# Patient Record
Sex: Female | Born: 1950 | Race: Black or African American | Hispanic: No | Marital: Married | Smoking: Never smoker
Health system: Southern US, Community
[De-identification: ages and names within clinical notes are randomized; demographics above are authoritative.]

## PROBLEM LIST (undated history)

## (undated) DIAGNOSIS — M069 Rheumatoid arthritis, unspecified: Secondary | ICD-10-CM

## (undated) DIAGNOSIS — E079 Disorder of thyroid, unspecified: Secondary | ICD-10-CM

## (undated) DIAGNOSIS — M109 Gout, unspecified: Secondary | ICD-10-CM

## (undated) DIAGNOSIS — I10 Essential (primary) hypertension: Secondary | ICD-10-CM

## (undated) DIAGNOSIS — J302 Other seasonal allergic rhinitis: Secondary | ICD-10-CM

## (undated) DIAGNOSIS — N200 Calculus of kidney: Secondary | ICD-10-CM

## (undated) DIAGNOSIS — D72819 Decreased white blood cell count, unspecified: Secondary | ICD-10-CM

## (undated) DIAGNOSIS — E7800 Pure hypercholesterolemia, unspecified: Secondary | ICD-10-CM

## (undated) HISTORY — DX: Calculus of kidney: N20.0

## (undated) HISTORY — PX: OTHER SURGICAL HISTORY: SHX169

## (undated) HISTORY — PX: BILATERAL KNEE ARTHROSCOPY: SUR91

## (undated) HISTORY — DX: Decreased white blood cell count, unspecified: D72.819

## (undated) HISTORY — DX: Disorder of thyroid, unspecified: E07.9

## (undated) HISTORY — DX: Pure hypercholesterolemia, unspecified: E78.00

## (undated) HISTORY — DX: Rheumatoid arthritis, unspecified: M06.9

---

## 2003-12-26 ENCOUNTER — Ambulatory Visit (HOSPITAL_COMMUNITY): Admission: RE | Admit: 2003-12-26 | Discharge: 2003-12-26 | Payer: Self-pay | Admitting: Family Medicine

## 2005-02-15 ENCOUNTER — Ambulatory Visit (HOSPITAL_COMMUNITY): Admission: RE | Admit: 2005-02-15 | Discharge: 2005-02-15 | Payer: Self-pay | Admitting: Family Medicine

## 2005-02-23 ENCOUNTER — Ambulatory Visit (HOSPITAL_COMMUNITY): Admission: RE | Admit: 2005-02-23 | Discharge: 2005-02-23 | Payer: Self-pay | Admitting: Family Medicine

## 2005-11-19 ENCOUNTER — Ambulatory Visit: Payer: Self-pay | Admitting: Orthopedic Surgery

## 2005-11-27 ENCOUNTER — Ambulatory Visit (HOSPITAL_COMMUNITY): Admission: RE | Admit: 2005-11-27 | Discharge: 2005-11-27 | Payer: Self-pay | Admitting: Orthopedic Surgery

## 2005-12-09 ENCOUNTER — Ambulatory Visit: Payer: Self-pay | Admitting: Orthopedic Surgery

## 2005-12-18 ENCOUNTER — Ambulatory Visit: Payer: Self-pay | Admitting: Orthopedic Surgery

## 2005-12-18 ENCOUNTER — Ambulatory Visit (HOSPITAL_COMMUNITY): Admission: RE | Admit: 2005-12-18 | Discharge: 2005-12-18 | Payer: Self-pay | Admitting: Orthopedic Surgery

## 2005-12-21 ENCOUNTER — Ambulatory Visit: Payer: Self-pay | Admitting: Orthopedic Surgery

## 2005-12-22 ENCOUNTER — Encounter (HOSPITAL_COMMUNITY): Admission: RE | Admit: 2005-12-22 | Discharge: 2006-01-21 | Payer: Self-pay | Admitting: Orthopedic Surgery

## 2006-01-11 ENCOUNTER — Ambulatory Visit: Payer: Self-pay | Admitting: Orthopedic Surgery

## 2006-03-17 ENCOUNTER — Ambulatory Visit: Payer: Self-pay | Admitting: Orthopedic Surgery

## 2006-06-03 ENCOUNTER — Ambulatory Visit (HOSPITAL_COMMUNITY): Admission: RE | Admit: 2006-06-03 | Discharge: 2006-06-03 | Payer: Self-pay | Admitting: Obstetrics & Gynecology

## 2006-09-29 ENCOUNTER — Ambulatory Visit: Payer: Self-pay | Admitting: Orthopedic Surgery

## 2007-01-06 ENCOUNTER — Ambulatory Visit: Payer: Self-pay | Admitting: Orthopedic Surgery

## 2007-01-07 ENCOUNTER — Ambulatory Visit: Payer: Self-pay | Admitting: Cardiovascular Disease

## 2007-01-10 ENCOUNTER — Ambulatory Visit (HOSPITAL_COMMUNITY): Admission: RE | Admit: 2007-01-10 | Discharge: 2007-01-10 | Payer: Self-pay | Admitting: Cardiovascular Disease

## 2007-01-10 ENCOUNTER — Ambulatory Visit: Payer: Self-pay | Admitting: Cardiovascular Disease

## 2008-01-17 ENCOUNTER — Ambulatory Visit (HOSPITAL_COMMUNITY): Admission: RE | Admit: 2008-01-17 | Discharge: 2008-01-17 | Payer: Self-pay | Admitting: Family Medicine

## 2008-09-17 ENCOUNTER — Ambulatory Visit (HOSPITAL_COMMUNITY): Admission: RE | Admit: 2008-09-17 | Discharge: 2008-09-17 | Payer: Self-pay | Admitting: Family Medicine

## 2011-02-20 ENCOUNTER — Other Ambulatory Visit (HOSPITAL_COMMUNITY): Payer: Self-pay | Admitting: Family Medicine

## 2011-02-20 DIAGNOSIS — M549 Dorsalgia, unspecified: Secondary | ICD-10-CM

## 2011-02-20 DIAGNOSIS — G8929 Other chronic pain: Secondary | ICD-10-CM

## 2011-02-24 ENCOUNTER — Ambulatory Visit (HOSPITAL_COMMUNITY)
Admission: RE | Admit: 2011-02-24 | Discharge: 2011-02-24 | Disposition: A | Discharge status: Home / Self Care | Payer: BC Managed Care – PPO | Source: Ambulatory Visit | Attending: Family Medicine | Admitting: Family Medicine

## 2011-02-24 ENCOUNTER — Encounter (HOSPITAL_COMMUNITY): Payer: Self-pay

## 2011-02-24 DIAGNOSIS — M549 Dorsalgia, unspecified: Secondary | ICD-10-CM

## 2011-02-24 DIAGNOSIS — M949 Disorder of cartilage, unspecified: Secondary | ICD-10-CM | POA: Insufficient documentation

## 2011-02-24 DIAGNOSIS — G8929 Other chronic pain: Secondary | ICD-10-CM

## 2011-02-24 DIAGNOSIS — M899 Disorder of bone, unspecified: Secondary | ICD-10-CM | POA: Insufficient documentation

## 2011-02-24 HISTORY — DX: Essential (primary) hypertension: I10

## 2011-04-17 NOTE — Procedures (Signed)
Megan [name], Megan [name] NO.:  [id number]   MEDICAL RECORD NO.:  [id number]          PATIENT TYPE:  OUT   LOCATION:  RAD                           FACILITY:  [facility]   PHYSICIAN:  Peter C. Eden Emms, MD, FACCDATE OF BIRTH:  April 30, 1951   DATE OF PROCEDURE:  01/10/2007  DATE OF DISCHARGE:                                ECHOCARDIOGRAM   INDICATIONS:  Palpitations and preop clearance   Left ventricular cavity size was upper limits of normal.  There was mild  LVH with basal septal hypertrophy.  The EF was 60%.  There was no  outflow tract gradient.   Mitral, aortic, tricuspid and pulmonary valves were structurally normal.  There are no stenoses or significant regurgitation.  There was moderate  aortic root dilatation consistent with hypertensive heart disease.  There is no evidence of dissection.   Subcostal imaging revealed no atrial septal defect, no source of  embolus, no effusion.   M-Mode measurements included an aortic dimension of 3.8 cm. Left atrial  dimension was 3.3 cm. Septal thickness was 1.4 cm. LV diastolic  dimension was 3.6 cm, and systolic dimension was 2.4 cm.   FINAL IMPRESSION:  1. Mild left ventricular hypertrophy with basal septal hypertrophy,      ejection fraction 60%.  2. Normal mitral valve.  3. Normal aortic valve.  4. Moderate aortic root dilatation.  5. Normal right ventricle and right atrium.  6. No pericardial effusion.      [name]. Eden Emms, MD, [facility]  Electronically Signed     PCN/MEDQ  D:  01/10/2007  T:  01/10/2007  Job:  [id number]

## 2011-04-17 NOTE — H&P (Signed)
Megan Megan [name], Megan [name] NO.:  [id number]   MEDICAL RECORD NO.:  [id number]          PATIENT TYPE:  AMB   LOCATION:  DAY                           FACILITY:  [facility]   PHYSICIAN:  Vickki Hearing, M.D.DATE OF BIRTH:  04-Jun-1951   DATE OF ADMISSION:  DATE OF DISCHARGE:  LH                                HISTORY & PHYSICAL   CHIEF COMPLAINT:  Pain in the right knee.   HISTORY OF PRESENT ILLNESS:  The patient is 60 years old.  She has 3  compartment osteoarthritis of the right knee with complex tear of the  posterior horn and mid body of the medial meniscus, and a small joint  effusion and small Baker's cyst.  This is consistent with her complaint of  mechanical symptoms and medial knee pain.   MEDICAL HISTORY:  1.  No allergies.  2.  History of lower back problems.   PAST SURGICAL HISTORY:  No other previous surgery.   MEDICATIONS:  She does not take any medications.   FAMILY HISTORY:  Asthma.   FAMILY PSYCHIATRIC HISTORY:  Dr. Renard Matter.   SOCIAL HISTORY:  She is married.  Currently not working.  Does not smoke or  drink.  Caffeine use on occasion.  Highest grade completed 2 years of  college.   REVIEW OF SYSTEMS:  History of weight gain, shortness of breath, kidney  stones, migraines, joint pain, joint swelling, thyroid disease.  All other  systems are normal.   PHYSICAL EXAMINATION:  VITAL SIGNS:  Weight 190, pulse 74, respiratory rate  18.  GENERAL APPEARANCE:  Normal.  PULSES:  Peripheral pulses were normal.  NEUROLOGIC:  Gait and station were normal.  EXTREMITIES:  Her right knee showed mild crepitance, functional range of  motion, normal strength and stability.  Pain with hyperflexion of the knee.  A positive McMurray's sign.  No instability in the knee.  Upper extremities  were aligned properly.  SKIN:  Intact.  NEUROPSYCHIATRIC EXAM:  Normal.   DIAGNOSIS:  Osteoarthritis, torn medial meniscus, right knee.   PLAN:  Arthroscopy, right  knee.   ADDENDUM:  The patient has given informed consent after explanation of risks  and benefits of the procedure including alternative of not doing any  surgery.      Vickki Hearing, M.D.  Electronically Signed     SEH/MEDQ  D:  12/15/2005  T:  12/15/2005  Job:  [id number]   cc:   [facility]

## 2011-04-17 NOTE — Op Note (Signed)
NAMEBABYGIRL, TRAGER NO.:  [id number]   MEDICAL RECORD NO.:  [id number]          PATIENT TYPE:  AMB   LOCATION:  DAY                           FACILITY:  [facility]   PHYSICIAN:  Vickki Hearing, M.D.DATE OF BIRTH:  1951/11/29   DATE OF PROCEDURE:  12/18/2005  DATE OF DISCHARGE:                                 OPERATIVE REPORT   SURGICAL HISTORY:  Megan [name] is 54 years, complained of right knee pain  with mechanical symptoms. Had MRI which showed a torn medial meniscus. This  was consistent with history and physical findings of medial compartment  tenderness, medial joint line pain and mechanical findings on exam.   PREOPERATIVE DIAGNOSIS:  Osteoarthritis of the right knee with torn medial  meniscus.   POSTOPERATIVE DIAGNOSIS:  Osteoarthritis of the right knee with torn medial  meniscus.   PROCEDURE:  Arthroscopy, partial medial meniscectomy, chondroplasty of the  medial femoral condyle and patella.   SURGEON:  Dr. Romeo Apple, no assistant.   SPECIMENS:  No specimen.   BLOOD LOSS:  Minimal.   COMPLICATIONS:  No complications. Counts reported as correct. The patient  went to PACU in good condition.   OPERATIVE FINDINGS:  There was a torn posterior horn to medial meniscus  degenerative complex tear. There was grade 3 changes of the medial femoral  condyle and grade 2/3 changes of the patella. Lateral compartment normal.  ACL, PCL intact.   PROCEDURE NOTE:  Procedure was done as follows:  The patient was identified  as Megan [name] in the preoperative holding area. Right knee was marked for  surgery, countersigned by the surgeon. History and physical updated and  antibiotics were started. She was taking to the operating room for general  anesthetic. After successful anesthesia, she was placed supine operating  table, and her right leg was prepped and draped using sterile technique. At  this time, a time-out was taken and completed as required.   Lateral  portal was established as a viewing portal, medial portal as a  working portal and diagnostic arthroscopy was done. See above for findings.  Through the medial portal, a medial meniscectomy was performed with a  straight duckbill forceps, and the medial meniscus was balanced with a  combination of a straight motorized shaver and a 90 degree ArthroCare wand.  Chondroplasty was then performed of the patella and medial femoral condyle  with a combination of arthroscopic instruments.   The knee was then irrigated, suctioned dry, closed with half inch Steri-  Strips, injected with half percent Marcaine with epinephrine, 20 cc total.  Sterile dressings and CryoCuff were applied. The patient was extubated and  taken to recovery room in stable condition.   POSTOPERATIVE PLAN:  Follow-up on Monday. Full weightbearing today. Change  dressings tomorrow. Start PT on Tuesday. She will be discharged home on  Vicodin ES one q.4h. p.r.n. for pain #60 with two refills.      Vickki Hearing, M.D.  Electronically Signed     SEH/MEDQ  D:  12/18/2005  T:  12/18/2005  Job:  [id number]

## 2011-04-17 NOTE — Assessment & Plan Note (Signed)
[name]                       [place] [facility] OFFICE NOTE   [name], [name]                        MRN:          [id number]  DATE:01/07/2007                            DOB:          Oct 23, 1951    [name] is referred today by Dr. Romeo Apple for preoperative clearance.  She is a 60 year old. She has been having palpitations. Palpitations  have been longstanding for at least 5 to 10 years. She has never had  syncope, chest pain, PND, orthopnea or diaphoresis.   She was on Lipitor in the past for hyperlipidemia and felt that this  made her heart race worse.   There has been no documented arrhythmias and no evidence of atrial  fibrillation or VT.   The patient has not had a previous cardiac work up.   Coronary risk factors include:  1. Hyperlipidemia.  2. Borderline blood pressure.  3. She is not a smoker, but gets some passive smoke from her husband      who smokes cigars around the house.   The patient needs right knee surgery. She has significant osteoarthritis  there.   She has had some cartilage removed from this knee before.   She does not smoke or drink.   She had a D&C 12 years ago, which is her only other surgery.   She has no documented allergies.   The patient's father died at age 77 of Alzheimer's. Mother died at age  22 of asthma.   She has a brother with high blood pressure.   She is happily married. She is retired. She continues to walk on a  regular basis including using a treadmill. This is all despite her right  knee osteoarthritis.   She takes Diovan 80 mg a day.   Her examination is remarkable for blood pressure of 128/70, pulse is 80  and regular.  HEENT: Is normal. There are no carotid bruits. No lymphadenopathy.  LUNGS:  Are clear.  She has an S1, S2 with a very short benign-sounding systolic ejection  murmur.  ABDOMEN: Is benign.  LOWER EXTREMITIES: Intact pulses. No edema.   EKG shows sinus  rhythm with nonspecific ST-T wave changes in the  inferior lateral leads and poor R-wave progression.   IMPRESSION:  The patient has an abnormal EKG. She is having  palpitations.   In regards to the risk for knee surgery, I think it is reasonable to  start her on low-dose Toprol at 25 mg a day. Beta-blockers have been  shown to be the best medication to decrease peri-operative risk.   We will do a stress Myoview on the patient to make sure that the EKG  changes do not represent occult coronary disease. She tells me she  thinks she can walk on the treadmill. Will also do a 2D echocardiogram  to assess LV function.   As long as her Myoview and echo are low-risk, we will allow her to have  her surgery February 26th, with Dr. Romeo Apple. She will take her Toprol  once a day including the morning of surgery. We would be happy to follow  her in the hospital post-op.     Megan [name]. Eden Emms, MD, [facility]  Electronically Signed    PCN/MedQ  DD: 01/07/2007  DT: 01/07/2007  Job #: [id number]

## 2011-06-08 ENCOUNTER — Other Ambulatory Visit (HOSPITAL_COMMUNITY): Payer: Self-pay | Admitting: Family Medicine

## 2011-06-08 ENCOUNTER — Ambulatory Visit (HOSPITAL_COMMUNITY)
Admission: RE | Admit: 2011-06-08 | Discharge: 2011-06-08 | Disposition: A | Discharge status: Home / Self Care | Payer: BC Managed Care – PPO | Source: Ambulatory Visit | Attending: Family Medicine | Admitting: Family Medicine

## 2011-06-08 DIAGNOSIS — M7730 Calcaneal spur, unspecified foot: Secondary | ICD-10-CM | POA: Insufficient documentation

## 2011-06-08 DIAGNOSIS — M545 Low back pain, unspecified: Secondary | ICD-10-CM

## 2011-06-08 DIAGNOSIS — M21969 Unspecified acquired deformity of unspecified lower leg: Secondary | ICD-10-CM

## 2011-06-08 DIAGNOSIS — M51379 Other intervertebral disc degeneration, lumbosacral region without mention of lumbar back pain or lower extremity pain: Secondary | ICD-10-CM | POA: Insufficient documentation

## 2011-06-08 DIAGNOSIS — M25579 Pain in unspecified ankle and joints of unspecified foot: Secondary | ICD-10-CM | POA: Insufficient documentation

## 2011-06-08 DIAGNOSIS — M2010 Hallux valgus (acquired), unspecified foot: Secondary | ICD-10-CM | POA: Insufficient documentation

## 2011-06-08 DIAGNOSIS — M5137 Other intervertebral disc degeneration, lumbosacral region: Secondary | ICD-10-CM | POA: Insufficient documentation

## 2012-02-25 DIAGNOSIS — M25569 Pain in unspecified knee: Secondary | ICD-10-CM | POA: Insufficient documentation

## 2012-03-01 DIAGNOSIS — M1711 Unilateral primary osteoarthritis, right knee: Secondary | ICD-10-CM | POA: Insufficient documentation

## 2012-03-01 DIAGNOSIS — M2140 Flat foot [pes planus] (acquired), unspecified foot: Secondary | ICD-10-CM | POA: Insufficient documentation

## 2014-07-11 ENCOUNTER — Other Ambulatory Visit (HOSPITAL_COMMUNITY): Payer: Self-pay | Admitting: Internal Medicine

## 2014-07-11 DIAGNOSIS — Z139 Encounter for screening, unspecified: Secondary | ICD-10-CM

## 2014-07-13 ENCOUNTER — Ambulatory Visit (HOSPITAL_COMMUNITY): Payer: BC Managed Care – PPO

## 2014-07-16 ENCOUNTER — Other Ambulatory Visit: Payer: Self-pay

## 2014-07-16 ENCOUNTER — Telehealth: Payer: Self-pay | Admitting: *Deleted

## 2014-07-16 DIAGNOSIS — Z1211 Encounter for screening for malignant neoplasm of colon: Secondary | ICD-10-CM

## 2014-07-16 NOTE — Telephone Encounter (Signed)
Pt called wanting to schedule her colonoscopy. Please advise [phone] or her cell [phone]

## 2014-07-18 NOTE — Telephone Encounter (Signed)
PREPOPIK-DRINK WATER TO KEEP URINE LIGHT YELLOW.  PT SHOULD DROP OFF RX 3 DAYS PRIOR TO PROCEDURE.  

## 2014-07-18 NOTE — Telephone Encounter (Signed)
Gastroenterology Pre-Procedure Review  Request Date: 07/17/2014 Requesting Physician: Dr. Legrand Rams  PATIENT REVIEW QUESTIONS: The patient responded to the following health history questions as indicated:    1. Diabetes Melitis: no 2. Joint replacements in the past 12 months: no 3. Major health problems in the past 3 months: no 4. Has an artificial valve or MVP: no 5. Has a defibrillator: no 6. Has been advised in past to take antibiotics in advance of a procedure like teeth cleaning: no    MEDICATIONS & ALLERGIES:    Patient reports the following regarding taking any blood thinners:   Plavix? no Aspirin? no Coumadin? no  Patient confirms/reports the following medications:  Current Outpatient Prescriptions  Medication Sig Dispense Refill  . acetaminophen (TYLENOL) 325 MG tablet Take 650 mg by mouth every 6 (six) hours as needed. Only as needed      . ibuprofen (ADVIL,MOTRIN) 200 MG tablet Take 200 mg by mouth every 6 (six) hours as needed. Only as needed       No current facility-administered medications for this visit.    Patient confirms/reports the following allergies:  No Known Allergies  No orders of the defined types were placed in this encounter.    AUTHORIZATION INFORMATION Primary Insurance:   ID #:   Group #:  Pre-Cert / Auth required:  Pre-Cert / Auth #:   Secondary Insurance:   ID #:   Group #:  Pre-Cert / Auth required:  Pre-Cert / Auth #:   SCHEDULE INFORMATION: Procedure has been scheduled as follows:  Date: 07/30/2014                   Time:  9:30 AM Location: [facility] Short Stay  This Gastroenterology Pre-Precedure Review Form is being routed to the following provider(s): Barney Drain, MD

## 2014-07-19 ENCOUNTER — Encounter (HOSPITAL_COMMUNITY): Payer: Self-pay | Admitting: Pharmacy Technician

## 2014-07-19 MED ORDER — SOD PICOSULFATE-MAG OX-CIT ACD 10-3.5-12 MG-GM-GM PO PACK: 1.0000 | PACK | ORAL | Status: DC

## 2014-07-19 NOTE — Telephone Encounter (Signed)
Rx sent to the pharmacy and instructions mailed to pt.  

## 2014-07-26 ENCOUNTER — Telehealth: Payer: Self-pay

## 2014-07-26 NOTE — Telephone Encounter (Signed)
Pt had questions about her prep because it was different from her husband's. He just had his colonoscopy a few weeks a go by Dr. Gala Romney. I explained to her that the doctors use different preps. Questions answered.

## 2014-07-30 ENCOUNTER — Encounter (HOSPITAL_COMMUNITY)
Admission: RE | Disposition: A | Discharge status: Home / Self Care | Payer: Self-pay | Source: Ambulatory Visit | Attending: Gastroenterology

## 2014-07-30 ENCOUNTER — Ambulatory Visit (HOSPITAL_COMMUNITY)
Admission: RE | Admit: 2014-07-30 | Discharge: 2014-07-30 | Disposition: A | Discharge status: Home / Self Care | Payer: No Typology Code available for payment source | Source: Ambulatory Visit | Attending: Gastroenterology | Admitting: Gastroenterology

## 2014-07-30 ENCOUNTER — Encounter (HOSPITAL_COMMUNITY): Payer: Self-pay | Admitting: *Deleted

## 2014-07-30 DIAGNOSIS — D126 Benign neoplasm of colon, unspecified: Secondary | ICD-10-CM | POA: Diagnosis not present

## 2014-07-30 DIAGNOSIS — K5730 Diverticulosis of large intestine without perforation or abscess without bleeding: Secondary | ICD-10-CM | POA: Diagnosis not present

## 2014-07-30 DIAGNOSIS — I10 Essential (primary) hypertension: Secondary | ICD-10-CM | POA: Insufficient documentation

## 2014-07-30 DIAGNOSIS — Z79899 Other long term (current) drug therapy: Secondary | ICD-10-CM | POA: Insufficient documentation

## 2014-07-30 DIAGNOSIS — K648 Other hemorrhoids: Secondary | ICD-10-CM | POA: Diagnosis not present

## 2014-07-30 DIAGNOSIS — Z1211 Encounter for screening for malignant neoplasm of colon: Secondary | ICD-10-CM | POA: Insufficient documentation

## 2014-07-30 HISTORY — PX: COLONOSCOPY: SHX5424

## 2014-07-30 HISTORY — DX: Other seasonal allergic rhinitis: J30.2

## 2014-07-30 SURGERY — COLONOSCOPY
Anesthesia: Moderate Sedation

## 2014-07-30 MED ORDER — STERILE WATER FOR IRRIGATION IR SOLN
Status: DC | PRN
  Administered 2014-07-30: 10:00:00

## 2014-07-30 MED ORDER — SODIUM CHLORIDE 0.9 % IV SOLN
INTRAVENOUS | Status: DC
  Administered 2014-07-30: 09:00:00 via INTRAVENOUS

## 2014-07-30 MED ORDER — HYDROCORTISONE 2.5 % RE CREA: 1.0000 "application " | TOPICAL_CREAM | Freq: Two times a day (BID) | RECTAL | Status: DC

## 2014-07-30 MED ORDER — SPOT INK MARKER SYRINGE KIT: PACK | SUBMUCOSAL | Status: DC | PRN

## 2014-07-30 MED ORDER — MEPERIDINE HCL 100 MG/ML IJ SOLN
INTRAMUSCULAR | Status: AC
  Filled 2014-07-30: qty 2

## 2014-07-30 MED ORDER — MEPERIDINE HCL 100 MG/ML IJ SOLN
INTRAMUSCULAR | Status: DC | PRN
  Administered 2014-07-30 (×2): 25 mg via INTRAVENOUS

## 2014-07-30 MED ORDER — MIDAZOLAM HCL 5 MG/5ML IJ SOLN
INTRAMUSCULAR | Status: DC | PRN
  Administered 2014-07-30: 1 mg via INTRAVENOUS
  Administered 2014-07-30 (×2): 2 mg via INTRAVENOUS

## 2014-07-30 MED ORDER — MIDAZOLAM HCL 5 MG/5ML IJ SOLN
INTRAMUSCULAR | Status: AC
  Filled 2014-07-30: qty 10

## 2014-07-30 NOTE — H&P (Signed)
  Primary Care Physician:  Rosita Fire, MD Primary Gastroenterologist:  Dr. Oneida Alar  Pre-Procedure History & Physical: HPI:  Megan [name] is a 63 y.o. female here for [place].  Past Medical History  Diagnosis Date  . Hypertension   . Seasonal allergies     Past Surgical History  Procedure Laterality Date  . Bilateral knee arthroscopy    . Right knee osteotomy      Prior to Admission medications   Medication Sig Start Date End Date Taking? Authorizing Provider  acetaminophen (TYLENOL) 325 MG tablet Take 650 mg by mouth every 6 (six) hours as needed. Only as needed   Yes Historical Provider, MD  hydrochlorothiazide (MICROZIDE) 12.5 MG capsule Take 12.5 mg by mouth daily.   Yes Historical Provider, MD  ibuprofen (ADVIL,MOTRIN) 200 MG tablet Take 200 mg by mouth every 6 (six) hours as needed. Only as needed   Yes Historical Provider, MD    Allergies as of 07/16/2014  . (No Known Allergies)    Family History  Problem Relation Age of Onset  . Colon cancer Paternal Uncle     History   Social History  . Marital Status: Married    Spouse Name: N/A    Number of Children: N/A  . Years of Education: N/A   Occupational History  . Not on file.   Social History Main Topics  . Smoking status: Never Smoker   . Smokeless tobacco: Not on file  . Alcohol Use: No  . Drug Use: No  . Sexual Activity: Not on file   Other Topics Concern  . Not on file   Social History Narrative  . No narrative on file    Review of Systems: See HPI, otherwise negative ROS   Physical Exam: BP 134/84  Pulse 70  Temp(Src) 98.1 F (36.7 C) (Oral)  Resp 18  Ht 5' 5.5" (1.664 m)  Wt 155 lb (70.308 kg)  BMI 25.39 kg/m2  SpO2 98% General:   Alert,  pleasant and cooperative in NAD Head:  Normocephalic and atraumatic. Neck:  Supple; Lungs:  Clear throughout to auscultation.    Heart:  Regular rate and rhythm. Abdomen:  Soft, nontender and nondistended. Normal bowel sounds,  without guarding, and without rebound.   Neurologic:  Alert and  oriented x4;  grossly normal neurologically.  Impression/Plan:     SCREENING  Plan:  1. TCS TODAY

## 2014-07-30 NOTE — Discharge Instructions (Signed)
You had 1 polyp removed. You have  internal hemorrhoids and diverticulosis IN YOUR LEFT COLON.   USE PROCTOZONE TWICE DAILY FOR 7-10 DAYS TO RELIVE RECTAL PAIN/PRESSURE/BLEEDING.  FOLLOW A HIGH FIBER DIET. AVOID ITEMS THAT CAUSE BLOATING. SEE INFO BELOW.  YOUR BIOPSY RESULTS SHOULD BE BACK IN 7 DAYS.  Next colonoscopy in 5-10 years.   Colonoscopy Care After Read the instructions outlined below and refer to this sheet in the next week. These discharge instructions provide you with general information on caring for yourself after you leave the hospital. While your treatment has been planned according to the most current medical practices available, unavoidable complications occasionally occur. If you have any problems or questions after discharge, call DR. Caylynn Minchew, [phone].  ACTIVITY  You may resume your regular activity, but move at a slower pace for the next 24 hours.   Take frequent rest periods for the next 24 hours.   Walking will help get rid of the air and reduce the bloated feeling in your belly (abdomen).   No driving for 24 hours (because of the medicine (anesthesia) used during the test).   You may shower.   Do not sign any important legal documents or operate any machinery for 24 hours (because of the anesthesia used during the test).    NUTRITION  Drink plenty of fluids.   You may resume your normal diet as instructed by your doctor.   Begin with a light meal and progress to your normal diet. Heavy or fried foods are harder to digest and may make you feel sick to your stomach (nauseated).   Avoid alcoholic beverages for 24 hours or as instructed.    MEDICATIONS  You may resume your normal medications.   WHAT YOU CAN EXPECT TODAY  Some feelings of bloating in the abdomen.   Passage of more gas than usual.   Spotting of blood in your stool or on the toilet paper  .  IF YOU HAD POLYPS REMOVED DURING THE COLONOSCOPY:  Eat a soft diet IF YOU HAVE  NAUSEA, BLOATING, ABDOMINAL PAIN, OR VOMITING.    FINDING OUT THE RESULTS OF YOUR TEST Not all test results are available during your visit. DR. Oneida Alar WILL CALL YOU WITHIN 7 DAYS OF YOUR PROCEDUE WITH YOUR RESULTS. Do not assume everything is normal if you have not heard from DR. Stellarose Cerny IN ONE WEEK, CALL HER OFFICE AT [phone].  SEEK IMMEDIATE MEDICAL ATTENTION AND CALL THE OFFICE: [phone] IF:  You have more than a spotting of blood in your stool.   Your belly is swollen (abdominal distention).   You are nauseated or vomiting.   You have a temperature over 101F.   You have abdominal pain or discomfort that is severe or gets worse throughout the day.  Polyps, Colon  A polyp is extra tissue that grows inside your body. Colon polyps grow in the large intestine. The large intestine, also called the colon, is part of your digestive system. It is a long, hollow tube at the end of your digestive tract where your body makes and stores stool. Most polyps are not dangerous. They are benign. This means they are not cancerous. But over time, some types of polyps can turn into cancer. Polyps that are smaller than a pea are usually not harmful. But larger polyps could someday become or may already be cancerous. To be safe, doctors remove all polyps and test them.   WHO GETS POLYPS? Anyone can get polyps, but certain people are  more likely than others. You may have a greater chance of getting polyps if:  You are over 50.   You have had polyps before.   Someone in your family has had polyps.   Someone in your family has had cancer of the large intestine.   Find out if someone in your family has had polyps. You may also be more likely to get polyps if you:   Eat a lot of fatty foods   Smoke   Drink alcohol   Do not exercise  Eat too much   TREATMENT  The caregiver will remove the polyp during sigmoidoscopy or colonoscopy.  PREVENTION There is not one sure way to prevent polyps.  You might be able to lower your risk of getting them if you:  Eat more fruits and vegetables and less fatty food.   Do not smoke.   Avoid alcohol.   Exercise every day.   Lose weight if you are overweight.   Eating more calcium and folate can also lower your risk of getting polyps. Some foods that are rich in calcium are milk, cheese, and broccoli. Some foods that are rich in folate are chickpeas, kidney beans, and spinach.   High-Fiber Diet A high-fiber diet changes your normal diet to include more whole grains, legumes, fruits, and vegetables. Changes in the diet involve replacing refined carbohydrates with unrefined foods. The calorie level of the diet is essentially unchanged. The Dietary Reference Intake (recommended amount) for adult males is 38 grams per day. For adult females, it is 25 grams per day. Pregnant and lactating women should consume 28 grams of fiber per day. Fiber is the intact part of a plant that is not broken down during digestion. Functional fiber is fiber that has been isolated from the plant to provide a beneficial effect in the body. PURPOSE  Increase stool bulk.   Ease and regulate bowel movements.   Lower cholesterol.  INDICATIONS THAT YOU NEED MORE FIBER  Constipation and hemorrhoids.   Uncomplicated diverticulosis (intestine condition) and irritable bowel syndrome.   Weight management.   As a protective measure against hardening of the arteries (atherosclerosis), diabetes, and cancer.   GUIDELINES FOR INCREASING FIBER IN THE DIET  Start adding fiber to the diet slowly. A gradual increase of about 5 more grams (2 slices of whole-wheat bread, 2 servings of most fruits or vegetables, or 1 bowl of high-fiber cereal) per day is best. Too rapid an increase in fiber may result in constipation, flatulence, and bloating.   Drink enough water and fluids to keep your urine clear or pale yellow. Water, juice, or caffeine-free drinks are recommended. Not  drinking enough fluid may cause constipation.   Eat a variety of high-fiber foods rather than one type of fiber.   Try to increase your intake of fiber through using high-fiber foods rather than fiber pills or supplements that contain small amounts of fiber.   The goal is to change the types of food eaten. Do not supplement your present diet with high-fiber foods, but replace foods in your present diet.  INCLUDE A VARIETY OF FIBER SOURCES  Replace refined and processed grains with whole grains, canned fruits with fresh fruits, and incorporate other fiber sources. White rice, white breads, and most bakery goods contain little or no fiber.   Brown whole-grain rice, buckwheat oats, and many fruits and vegetables are all good sources of fiber. These include: broccoli, Brussels sprouts, cabbage, cauliflower, beets, sweet potatoes, white potatoes (skin on), carrots,  tomatoes, eggplant, squash, berries, fresh fruits, and dried fruits.   Cereals appear to be the richest source of fiber. Cereal fiber is found in whole grains and bran. Bran is the fiber-rich outer coat of cereal grain, which is largely removed in refining. In whole-grain cereals, the bran remains. In breakfast cereals, the largest amount of fiber is found in those with "bran" in their names. The fiber content is sometimes indicated on the label.   You may need to include additional fruits and vegetables each day.   In baking, for 1 cup white flour, you may use the following substitutions:   1 cup whole-wheat flour minus 2 tablespoons.   1/2 cup white flour plus 1/2 cup whole-wheat flour.   Diverticulosis Diverticulosis is a common condition that develops when small pouches (diverticula) form in the wall of the colon. The risk of diverticulosis increases with age. It happens more often in people who eat a low-fiber diet. Most individuals with diverticulosis have no symptoms. Those individuals with symptoms usually experience belly  (abdominal) pain, constipation, or loose stools (diarrhea).  HOME CARE INSTRUCTIONS  Increase the amount of fiber in your diet as directed by your caregiver or dietician. This may reduce symptoms of diverticulosis.   Drink at least 6 to 8 glasses of water each day to prevent constipation.   Try not to strain when you have a bowel movement.   Avoiding nuts and seeds to prevent complications is still an uncertain benefit.       FOODS HAVING HIGH FIBER CONTENT INCLUDE:  Fruits. Apple, peach, pear, tangerine, raisins, prunes.   Vegetables. Brussels sprouts, asparagus, broccoli, cabbage, carrot, cauliflower, romaine lettuce, spinach, summer squash, tomato, winter squash, zucchini.   Starchy Vegetables. Baked beans, kidney beans, lima beans, split peas, lentils, potatoes (with skin).   Grains. Whole wheat bread, brown rice, bran flake cereal, plain oatmeal, white rice, shredded wheat, bran muffins.    SEEK IMMEDIATE MEDICAL CARE IF:  You develop increasing pain or severe bloating.   You have an oral temperature above 101F.   You develop vomiting or bowel movements that are bloody or black.   Hemorrhoids Hemorrhoids are dilated (enlarged) veins around the rectum. Sometimes clots will form in the veins. This makes them swollen and painful. These are called thrombosed hemorrhoids. Causes of hemorrhoids include:  Constipation.   Straining to have a bowel movement.   HEAVY LIFTING HOME CARE INSTRUCTIONS  Eat a well balanced diet and drink 6 to 8 glasses of water every day to avoid constipation. You may also use a bulk laxative.   Avoid straining to have bowel movements.   Keep anal area dry and clean.   Do not use a donut shaped pillow or sit on the toilet for long periods. This increases blood pooling and pain.   Move your bowels when your body has the urge; this will require less straining and will decrease pain and pressure.

## 2014-07-30 NOTE — Op Note (Signed)
[facility] [address] [place], [address]   COLONOSCOPY PROCEDURE REPORT  PATIENT: Megan [name], Megan [name]  MR#: [id number] BIRTHDATE: [birth year] , 63  yrs. old GENDER: Female ENDOSCOPIST: Barney Drain, MD REFERRED AX:ENMMHWKG Fanta, M.D. PROCEDURE DATE:  07/30/2014 PROCEDURE:   Colonoscopy with snare polypectomy INDICATIONS:Average risk patient for colon cancer. MEDICATIONS: Demerol 50 mg IV and Versed 5 mg IV  DESCRIPTION OF PROCEDURE:    Physical exam was performed.  Informed consent was obtained from the patient after explaining the benefits, risks, and alternatives to procedure.  The patient was connected to monitor and placed in left lateral position. Continuous oxygen was provided by nasal cannula and IV medicine administered through an indwelling cannula.  After administration of sedation and rectal exam, the patients rectum was intubated and the EC-[id number] ([id number])  colonoscope was advanced under direct visualization to the cecum.  The scope was removed slowly by carefully examining the color, texture, anatomy, and integrity mucosa on the way out.  The patient was recovered in endoscopy and discharged home in satisfactory condition.       COLON FINDINGS: A sessile polyp measuring 6 mm in size was found at the hepatic flexure.  A polypectomy was performed using snare cautery.  , There was mild diverticulosis noted in the sigmoid colon with associated tortuosity and muscular hypertrophy.  , and Moderate sized internal hemorrhoids were found.  PREP QUALITY: good. CECAL W/D TIME: 14 minutes     COMPLICATIONS: None  ENDOSCOPIC IMPRESSION: 1.   ONE COLON polyp REMOVED 2.   Mild diverticulosis in the sigmoid colon 3.   Moderate sized internal hemorrhoids   RECOMMENDATIONS: USE PROCTOZONE TWICE DAILY FOR 7-10 DAYS TO RELIEVE RECTAL PAIN/PRESSURE/BLEEDING. FOLLOW A HIGH FIBER DIET.  AVOID ITEMS THAT CAUSE BLOATING. BIOPSY RESULTS SHOULD BE BACK IN 7  DAYS.  Next colonoscopy in 5-10 years.       _______________________________ Lorrin MaisBarney Drain, MD 07/30/2014 4:08 PM

## 2014-08-09 ENCOUNTER — Telehealth: Payer: Self-pay | Admitting: Gastroenterology

## 2014-08-09 NOTE — Telephone Encounter (Signed)
Reminder in epic °

## 2014-08-09 NOTE — Telephone Encounter (Signed)
Pt is aware of results. 

## 2014-08-09 NOTE — Telephone Encounter (Signed)
Please call pt. She had ONE simple adenoma removed from her colon.   FOLLOW A HIGH FIBER DIET. AVOID ITEMS THAT CAUSE BLOATING.  Next colonoscopy in 5-10 years.

## 2015-07-02 DIAGNOSIS — R7309 Other abnormal glucose: Secondary | ICD-10-CM | POA: Diagnosis not present

## 2015-07-02 DIAGNOSIS — E785 Hyperlipidemia, unspecified: Secondary | ICD-10-CM | POA: Diagnosis not present

## 2015-07-02 DIAGNOSIS — I10 Essential (primary) hypertension: Secondary | ICD-10-CM | POA: Diagnosis not present

## 2015-07-04 DIAGNOSIS — Z23 Encounter for immunization: Secondary | ICD-10-CM | POA: Diagnosis not present

## 2015-07-15 DIAGNOSIS — M25562 Pain in left knee: Secondary | ICD-10-CM | POA: Diagnosis not present

## 2015-07-15 DIAGNOSIS — M542 Cervicalgia: Secondary | ICD-10-CM | POA: Diagnosis not present

## 2015-07-15 DIAGNOSIS — I10 Essential (primary) hypertension: Secondary | ICD-10-CM | POA: Diagnosis not present

## 2015-07-25 ENCOUNTER — Ambulatory Visit: Payer: No Typology Code available for payment source | Admitting: Orthopedic Surgery

## 2015-07-30 ENCOUNTER — Ambulatory Visit (INDEPENDENT_AMBULATORY_CARE_PROVIDER_SITE_OTHER): Payer: Medicare Other

## 2015-07-30 ENCOUNTER — Ambulatory Visit (INDEPENDENT_AMBULATORY_CARE_PROVIDER_SITE_OTHER): Payer: Medicare Other | Admitting: Orthopedic Surgery

## 2015-07-30 VITALS — BP 135/88 | Ht 65.5 in | Wt 161.0 lb

## 2015-07-30 DIAGNOSIS — M25562 Pain in left knee: Secondary | ICD-10-CM | POA: Diagnosis not present

## 2015-07-30 NOTE — Patient Instructions (Signed)
Joint Injection  Care After  Refer to this sheet in the next few days. These instructions provide you with information on caring for yourself after you have had a joint injection. Your caregiver also may give you more specific instructions. Your treatment has been planned according to current medical practices, but problems sometimes occur. Call your caregiver if you have any problems or questions after your procedure.  After any type of joint injection, it is not uncommon to experience:  · Soreness, swelling, or bruising around the injection site.  · Mild numbness, tingling, or weakness around the injection site caused by the numbing medicine used before or with the injection.  It also is possible to experience the following effects associated with the specific agent after injection:  · Iodine-based contrast agents:  ¨ Allergic reaction (itching, hives, widespread redness, and swelling beyond the injection site).  · Corticosteroids (These effects are rare.):  ¨ Allergic reaction.  ¨ Increased blood sugar levels (If you have diabetes and you notice that your blood sugar levels have increased, notify your caregiver).  ¨ Increased blood pressure levels.  ¨ Mood swings.  · Hyaluronic acid in the use of viscosupplementation.  ¨ Temporary heat or redness.  ¨ Temporary rash and itching.  ¨ Increased fluid accumulation in the injected joint.  These effects all should resolve within a day after your procedure.   HOME CARE INSTRUCTIONS  · Limit yourself to light activity the day of your procedure. Avoid lifting heavy objects, bending, stooping, or twisting.  · Take prescription or over-the-counter pain medication as directed by your caregiver.  · You may apply ice to your injection site to reduce pain and swelling the day of your procedure. Ice may be applied 03-04 times:  ¨ Put ice in a plastic bag.  ¨ Place a towel between your skin and the bag.  ¨ Leave the ice on for no longer than 15-20 minutes each time.  SEEK  IMMEDIATE MEDICAL CARE IF:   · Pain and swelling get worse rather than better or extend beyond the injection site.  · Numbness does not go away.  · Blood or fluid continues to leak from the injection site.  · You have chest pain.  · You have swelling of your face or tongue.  · You have trouble breathing or you become dizzy.  · You develop a fever, chills, or severe tenderness at the injection site that last longer than 1 day.  MAKE SURE YOU:  · Understand these instructions.  · Watch your condition.  · Get help right away if you are not doing well or if you get worse.  Document Released: 07/30/2011 Document Revised: 02/08/2012 Document Reviewed: 07/30/2011  ExitCare® Patient Information ©2015 [employer]. This information is not intended to replace advice given to you by your health care provider. Make sure you discuss any questions you have with your health care provider.

## 2015-08-01 ENCOUNTER — Encounter: Payer: Self-pay | Admitting: Orthopedic Surgery

## 2015-08-01 NOTE — Progress Notes (Signed)
Patient ID: Megan [name], female   DOB: February 22, 1951, 64 y.o.   MRN: [id number]   Chief Complaint  Patient presents with  . Knee Pain    left knee pain, no known injury    Megan [name] is a 64 y.o. female.   HPI 64 year old female with left knee pain for 4 months. There was no injury. She reports pain swelling aching from time to time with grade 1-2 out of 10 pain. She does get some relief from taking pain medication. She has the belief of Jehovah's Witness, but no longer is practicing. However does not want blood or any  Review of systems sinusitis some lightheadedness seasonal allergies back pain from time to time.. She had a right knee osteotomy years ago with plate fixation did well Review of Systems See hpi  Past Medical History  Diagnosis Date  . Hypertension   . Seasonal allergies     No Known Allergies  Current Outpatient Prescriptions  Medication Sig Dispense Refill  . acetaminophen (TYLENOL) 325 MG tablet Take 650 mg by mouth every 6 (six) hours as needed. Only as needed    . hydrochlorothiazide (MICROZIDE) 12.5 MG capsule Take 12.5 mg by mouth daily.    . hydrocortisone (PROCTOZONE-HC) 2.5 % rectal cream Place 1 application rectally 2 (two) times daily. For 10 days 30 g 0  . ibuprofen (ADVIL,MOTRIN) 200 MG tablet Take 200 mg by mouth every 6 (six) hours as needed. Only as needed     No current facility-administered medications for this visit.     Physical Exam Blood pressure 135/88, height 5' 5.5" (1.664 m), weight 161 lb (73.029 kg). Physical Exam  The patient is well developed well nourished and well groomed.   Orientation to person place and time is normal   Mood is pleasant.  Ambulatory status she has no difficulties walking    On inspection her right knee shows full range of motion stability and strength with no tenderness or swelling. Neurovascular exam is intact skin is clean dry without laceration or ulceration rash or erythema.    The left knee  is tender over the medial joint line. Range of motion is remained greater than 90 knee is stable in antrum posterior planar no collateral ligament injuries or laxity. Motor exam remains normal skin is normal as well there is no erythema lacerations or rashes. Neurovascular exam is intact.   Data Reviewed X-rays of the knee today in the office were ordered. I have interpreted those films as right knee plate fixation internal fixation. Left knee varus arthritis mild to moderate. Diagnosis Osteoarthritis left knee  Management The pient cannot have surgery with me but she can have surgery at [facility] as they did her other procedure if it comes to that. In the short-term we can manage her with injection.  We prepared the knee for injection  Inject the left knee  Procedure note left knee injection verbal consent was obtained to inject left knee joint  Timeout was completed to confirm the site of injection  The medications used were 40 mg of Depo-Medrol and 1% lidocaine 3 cc  Anesthesia was provided by ethyl chloride and the skin was prepped with alcohol.  After cleaning the skin with alcohol a 20-gauge needle was used to inject the left knee joint. There were no complications. A sterile bandage was applied.

## 2015-09-10 ENCOUNTER — Ambulatory Visit: Payer: Medicare Other | Admitting: Orthopedic Surgery

## 2015-09-10 ENCOUNTER — Encounter: Payer: Self-pay | Admitting: Orthopedic Surgery

## 2015-10-18 DIAGNOSIS — R7309 Other abnormal glucose: Secondary | ICD-10-CM | POA: Diagnosis not present

## 2015-10-18 DIAGNOSIS — Z23 Encounter for immunization: Secondary | ICD-10-CM | POA: Diagnosis not present

## 2015-10-18 DIAGNOSIS — I10 Essential (primary) hypertension: Secondary | ICD-10-CM | POA: Diagnosis not present

## 2015-10-18 DIAGNOSIS — E785 Hyperlipidemia, unspecified: Secondary | ICD-10-CM | POA: Diagnosis not present

## 2016-01-21 ENCOUNTER — Other Ambulatory Visit: Payer: Self-pay | Admitting: Oral Surgery

## 2016-03-04 ENCOUNTER — Other Ambulatory Visit (HOSPITAL_COMMUNITY): Payer: Self-pay | Admitting: Internal Medicine

## 2016-03-04 DIAGNOSIS — Z1231 Encounter for screening mammogram for malignant neoplasm of breast: Secondary | ICD-10-CM

## 2016-03-12 ENCOUNTER — Ambulatory Visit (HOSPITAL_COMMUNITY)
Admission: RE | Admit: 2016-03-12 | Discharge: 2016-03-12 | Disposition: A | Discharge status: Home / Self Care | Payer: Medicare Other | Source: Ambulatory Visit | Attending: Internal Medicine | Admitting: Internal Medicine

## 2016-03-12 DIAGNOSIS — Z1231 Encounter for screening mammogram for malignant neoplasm of breast: Secondary | ICD-10-CM | POA: Insufficient documentation

## 2016-04-10 ENCOUNTER — Ambulatory Visit (HOSPITAL_COMMUNITY): Payer: Medicare Other | Admitting: Hematology & Oncology

## 2016-04-21 ENCOUNTER — Encounter (HOSPITAL_COMMUNITY): Payer: Self-pay | Admitting: Hematology & Oncology

## 2016-04-21 ENCOUNTER — Encounter (HOSPITAL_COMMUNITY): Payer: Medicare Other | Attending: Hematology & Oncology | Admitting: Hematology & Oncology

## 2016-04-21 ENCOUNTER — Encounter (HOSPITAL_COMMUNITY): Payer: Medicare Other

## 2016-04-21 VITALS — BP 130/94 | HR 66 | Temp 98.1°F | Ht 64.0 in | Wt 162.6 lb

## 2016-04-21 DIAGNOSIS — D709 Neutropenia, unspecified: Secondary | ICD-10-CM

## 2016-04-21 DIAGNOSIS — D72819 Decreased white blood cell count, unspecified: Secondary | ICD-10-CM | POA: Diagnosis not present

## 2016-04-21 LAB — CBC WITH DIFFERENTIAL/PLATELET
BASOS ABS: 0 10*3/uL (ref 0.0–0.1)
Basophils Relative: 0 %
EOS ABS: 0.2 10*3/uL (ref 0.0–0.7)
EOS PCT: 6 %
HCT: 37.3 % (ref 36.0–46.0)
HEMOGLOBIN: 12.2 g/dL (ref 12.0–15.0)
Lymphocytes Relative: 25 %
Lymphs Abs: 0.9 10*3/uL (ref 0.7–4.0)
MCH: 29.7 pg (ref 26.0–34.0)
MCHC: 32.7 g/dL (ref 30.0–36.0)
MCV: 90.8 fL (ref 78.0–100.0)
Monocytes Absolute: 0.3 10*3/uL (ref 0.1–1.0)
Monocytes Relative: 9 %
NEUTROS PCT: 60 %
Neutro Abs: 2.2 10*3/uL (ref 1.7–7.7)
PLATELETS: 219 10*3/uL (ref 150–400)
RBC: 4.11 MIL/uL (ref 3.87–5.11)
RDW: 12.9 % (ref 11.5–15.5)
WBC: 3.7 10*3/uL — ABNORMAL LOW (ref 4.0–10.5)

## 2016-04-21 LAB — COMPREHENSIVE METABOLIC PANEL
ALT: 18 U/L (ref 14–54)
AST: 21 U/L (ref 15–41)
Albumin: 4.4 g/dL (ref 3.5–5.0)
Alkaline Phosphatase: 75 U/L (ref 38–126)
Anion gap: 5 (ref 5–15)
BUN: 12 mg/dL (ref 6–20)
CO2: 29 mmol/L (ref 22–32)
Calcium: 10.2 mg/dL (ref 8.9–10.3)
Chloride: 105 mmol/L (ref 101–111)
Creatinine, Ser: 0.69 mg/dL (ref 0.44–1.00)
Glucose, Bld: 92 mg/dL (ref 65–99)
Potassium: 4 mmol/L (ref 3.5–5.1)
Sodium: 139 mmol/L (ref 135–145)
TOTAL PROTEIN: 8 g/dL (ref 6.5–8.1)
Total Bilirubin: 0.5 mg/dL (ref 0.3–1.2)

## 2016-04-21 LAB — C-REACTIVE PROTEIN: CRP: 0.7 mg/dL (ref <1.0)

## 2016-04-21 LAB — SEDIMENTATION RATE: SED RATE: 41 mm/h — AB (ref 0–22)

## 2016-04-21 NOTE — Progress Notes (Signed)
[facility]  CONSULT NOTE  Patient Care Team: Rosita Fire, MD as PCP - General (Internal Medicine)  CHIEF COMPLAINTS/PURPOSE OF CONSULTATION:  Leukopenia/Neutropenia   HISTORY OF PRESENTING ILLNESS:  Megan [name] 65 y.o. female is here because of leukopenia. She comes to the [facility] today unaccompanied.  Laboratory studies from 03/05/2016 show a total WBC count of 2.8 with ANC of 1361, otherwise normal differential. Remainder of CBC is WNL. Labs dated 07/11/2014 show a WBC count of 4.2 with ANC of 2293. Remainder of differential and CBC is WNL.  She denies fever or frequent illness. She denies new onset fatigue. She notes she is extremely active. She describes herself as constantly on the go. She denies joint pain, cough, abdominal pain, change in bowel or bladder habits.   Megan [name] confirms that she gets mammograms every year and believes that her last mammogram was done here, less than 3 months ago.  She says she has had two or three surgeries on her legs/knees. When asked about this, she notes "they were scraping arthritis, they said."  Megan [name] notes that her period "left her at age 13," but confirms having had regular pap smears up until her last OB/GYN appointment, where she was told she no longer needs them.  She presents for further evaluation and recommendations.   MEDICAL HISTORY:  Past Medical History  Diagnosis Date  . Hypertension   . Seasonal allergies   . High cholesterol     SURGICAL HISTORY: Past Surgical History  Procedure Laterality Date  . Bilateral knee arthroscopy    . Right knee osteotomy    . Colonoscopy N/A 07/30/2014    Procedure: COLONOSCOPY;  Surgeon: Danie Binder, MD;  Location: [facility];  Service: Endoscopy;  Laterality: N/A;  9:30 AM  . High cholesterol      SOCIAL HISTORY: Social History   Social History  . Marital Status: Married    Spouse Name: N/A  . Number of Children: N/A  . Years of Education:  N/A   Occupational History  . Not on file.   Social History Main Topics  . Smoking status: Never Smoker   . Smokeless tobacco: Not on file  . Alcohol Use: No  . Drug Use: No  . Sexual Activity: Not on file   Other Topics Concern  . Not on file   Social History Narrative   Born in [place] Married 50 years soon, "in less than 2 years." Married at age 73. 4 children; 3 hers, one adopted. 2 grandbabies, aged 26 and 66. Boys. Non-smoker, no problems with alcohol or drugs. 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 Mom used to work in a Teacher, early years/pre in [place]; Mrs. Kullberg used to help there from time to time. Hobbies include dancing, sewing clothes & drapes, sewing. She notes "oh I have lots of hobbies." Enjoys gardening, cuts the grass, washes the cars, take the grandkids places, etc. She likes to feed the birds. Walks 2 miles a day most of the time; used to walk 5 when she wasn't as busy with her niece.  FAMILY HISTORY: Family History  Problem Relation Age of Onset  . Colon cancer Paternal Uncle   . Hypertension Mother   . Asthma Mother   . Hypertension Brother   . Other Father    Mother & father both deceased Mother was 34 when she died; notes that "she had asthma." Father died from "  a rare blood disease" at age 6.  Maternal grandmother lived longer than mother. Says that cancer runs in their family 2 brothers, says "they use alcohol so I really don't know" in terms of their health.  ALLERGIES:  is allergic to other.  MEDICATIONS:  Current Outpatient Prescriptions  Medication Sig Dispense Refill  . atorvastatin (LIPITOR) 10 MG tablet Take 10 mg by mouth daily. States she recently restarted taking for high cholesterol    . lisinopril (PRINIVIL,ZESTRIL) 5 MG tablet Take 5 mg by mouth daily. States she recently restarted taking this for hypertension    . Melatonin 1 MG CAPS Take by mouth. States she recently started  taking melatonin at bedtime for sleep but is unsure of dosage    . acetaminophen (TYLENOL) 325 MG tablet Take 650 mg by mouth every 6 (six) hours as needed. Only as needed    . hydrochlorothiazide (MICROZIDE) 12.5 MG capsule Take 12.5 mg by mouth daily.    . hydrocortisone (PROCTOZONE-HC) 2.5 % rectal cream Place 1 application rectally 2 (two) times daily. For 10 days 30 g 0  . ibuprofen (ADVIL,MOTRIN) 200 MG tablet Take 200 mg by mouth every 6 (six) hours as needed. Only as needed     No current facility-administered medications for this visit.    Review of Systems  Constitutional: Negative.  Negative for fever, chills, weight loss and malaise/fatigue.  HENT: Negative.  Negative for congestion, hearing loss, nosebleeds, sore throat and tinnitus.   Eyes: Negative.  Negative for blurred vision, double vision, pain and discharge.  Respiratory: Negative.  Negative for cough, hemoptysis, sputum production, shortness of breath and wheezing.   Cardiovascular: Negative.  Negative for chest pain, palpitations, claudication, leg swelling and PND.  Gastrointestinal: Negative.  Negative for heartburn, nausea, vomiting, abdominal pain, diarrhea, constipation, blood in stool and melena.  Genitourinary: Negative.  Negative for dysuria, urgency, frequency and hematuria.  Musculoskeletal: Negative.  Negative for myalgias, joint pain and falls.  Skin: Negative.  Negative for itching and rash.  Neurological: Negative.  Negative for dizziness, tingling, tremors, sensory change, speech change, focal weakness, seizures, loss of consciousness, weakness and headaches.  Endo/Heme/Allergies: Negative.  Does not bruise/bleed easily.  Psychiatric/Behavioral: Negative.  Negative for depression, suicidal ideas, memory loss and substance abuse. The patient is not nervous/anxious and does not have insomnia.   All other systems reviewed and are negative.  14 point ROS was done and is otherwise as detailed above or in  HPI   PHYSICAL EXAMINATION: ECOG PERFORMANCE STATUS: 0 - Asymptomatic  Filed Vitals:   04/21/16 1453  BP: 130/94  Pulse: 66  Temp: 98.1 F (36.7 C)   Filed Weights   04/21/16 1453  Weight: 162 lb 9.6 oz (73.755 kg)    Physical Exam  Constitutional: She is oriented to person, place, and time and well-developed, well-nourished, and in no distress.  HENT:  Head: Normocephalic and atraumatic.  Nose: Nose normal.  Mouth/Throat: Oropharynx is clear and moist. No oropharyngeal exudate.  Eyes: Conjunctivae and EOM are normal. Pupils are equal, round, and reactive to light. Right eye exhibits no discharge. Left eye exhibits no discharge. No scleral icterus.  Neck: Normal range of motion. Neck supple. No tracheal deviation present. No thyromegaly present.  Cardiovascular: Normal rate and regular rhythm.  Exam reveals no gallop and no friction rub.   Murmur heard. Very soft murmur, grade I.  Pulmonary/Chest: Effort normal and breath sounds normal. She has no wheezes. She has no rales.  Abdominal: Soft. Bowel  sounds are normal. She exhibits no distension and no mass. There is no tenderness. There is no rebound and no guarding.  Musculoskeletal: Normal range of motion. She exhibits no edema.  Lymphadenopathy:    She has no cervical adenopathy.  Neurological: She is alert and oriented to person, place, and time. She has normal reflexes. No cranial nerve deficit. Gait normal. Coordination normal.  Skin: Skin is warm and dry. No rash noted.  Psychiatric: Mood, memory, affect and judgment normal.  Nursing note and vitals reviewed.   LABORATORY DATA:  I have reviewed the data as listed No results found for: WBC, HGB, HCT, MCV, PLT CMP  No results found for: NA, K, CL, CO2, GLUCOSE, BUN, CREATININE, CALCIUM, PROT, ALBUMIN, AST, ALT, ALKPHOS, BILITOT, GFRNONAA, GFRAA As detailed under HPI   ASSESSMENT & PLAN:  Leukopenia/Neutropenia  One episode of mild leukopenia/neutropenia. She had  previously followed with Dr. Emilee Hero for many years prior to his retirement. Obtaining labs from his office will be helpful as it is likely she may have benign neutropenia of race but documentation over time would be needed.   We discussed the following:  Monitoring the [place] - In some cases, isolated neutropenia may evolve to a more serious condition, while in many others the patient may never become ill. Thus, it is important to balance appropriate periodic reevaluation with avoidance of unnecessary worry in an otherwise healthy individual.  Most cases of incidentally discovered neutropenia are transient or constitutional, and patients with a stable absolute neutrophil count (ANC) who are not thought to be at high risk of infection can have their complete blood count (CBC) with differential monitored on a periodic basis as an outpatient, at gradually increasing intervals. Patients with constitutional neutropenia and a long history of stable ANCs do not require further monitoring or evaluation. The following outlines a reasonable tempo of monitoring in an otherwise healthy individual: ?First eight weeks - In patients with moderate or severe neutropenia in whom we do not suspect constitutional neutropenia, it is reasonable to check the CBC with differential and reevaluate the patient at two week intervals. ?First year - If the neutropenia persists for more than eight weeks without the development of other findings, we extend the interval to approximately every three months. ?After the first year - If the neutropenia is mild (ie, ANC >1000 cells/microL) and there have been no symptoms for over a year, we counsel the patients on the possible symptoms associated with infection and bone marrow failure, and tell them to return if any such symptoms develop. ?Resolution of neutropenia - If the neutropenia resolves, we follow the patient periodically for one year with evaluation of the CBC whenever fever  occurs.   We have checked the following labs detailed below. She will return to the clinic in 2 weeks to review. She is to call with any fever or concerns in the interim.    Orders Placed This Encounter  Procedures  . Sedimentation rate  . Systemic Lupus Profile A  . Comprehensive metabolic panel  . CBC with Differential  . Pathologist smear review  . C-reactive protein    All questions were answered. The patient knows to call the clinic with any problems, questions or concerns.  This document serves as a record of services personally performed by Ancil Linsey, MD. It was created on her behalf by Toni Amend, a trained medical scribe. The creation of this record is based on the scribe's personal observations and the provider's statements to them. This  document has been checked and approved by the attending provider.  I have reviewed the above documentation for accuracy and completeness and I agree with the above.  This note was electronically signed.    Molli Hazard, MD  04/21/2016 3:59 PM

## 2016-04-21 NOTE — Patient Instructions (Addendum)
[facility] at [facility] Discharge Instructions  RECOMMENDATIONS MADE BY THE CONSULTANT AND ANY TEST RESULTS WILL BE SENT TO YOUR REFERRING PHYSICIAN.   Exam done and seen today by Dr. Whitney Muse Dr. Whitney Muse recommends flow cytometry (peripheral blood draw) and then follow up with her in 1-1.5 weeks once blood work returns.  Sign a release for medical records from Dr. Julianne Rice office for your prior blood work, he should have your records from the time you were seeing Dr. Everette Rank.  Let Dr. Whitney Muse know if you choose to pursue having bone marrow biopsy after blood work returns as she discussed with you.  Labs to be obtained today.   Please call the clinic if you have any questions or concerns  Thank you for choosing [facility] to provide your oncology and hematology care.  To afford each patient quality time with our provider, please arrive at least 15 minutes before your scheduled appointment time.   Beginning January 23rd 2017 lab work for the [facility] will be done in the  Main lab at [facility] on 1st floor. If you have a lab appointment with the [facility] please come in thru the  Main Entrance and check in at the main information desk  You need to re-schedule your appointment should you arrive 10 or more minutes late.  We strive to give you quality time with our providers, and arriving late affects you and other patients whose appointments are after yours.  Also, if you no show three or more times for appointments you may be dismissed from the clinic at the providers discretion.     Again, thank you for choosing [facility].  Our hope is that these requests will decrease the amount of time that you wait before being seen by our physicians.       _____________________________________________________________  Should you have questions after your visit to [facility], please contact our office at [phone]  [phone] between the hours of 8:30 a.m. and 4:30 p.m.  Voicemails left after 4:30 p.m. will not be returned until the following business day.  For prescription refill requests, have your pharmacy contact our office.         Resources For Cancer Patients and their Caregivers ? American Cancer Society: Can assist with transportation, wigs, general needs, runs Look Good Feel Better.        (779) 168-8010 ? Cancer Care: Provides financial assistance, online support groups, medication/co-pay assistance.  1-800-813-HOPE 951 292 2227) ? [facility] Assists [place] cancer patients and their families through emotional , educational and financial support.  337-812-4899 ? [place] DSS Where to apply for food stamps, Medicaid and utility assistance. 734-676-7024 ? [facility]: Transportation to medical appointments. 845 549 5469 ? Social Security Administration: May apply for disability if have a Stage IV cancer. 513-209-1050 650-033-0280 ? [employer], Disability and Transit Services: Assists with nutrition, care and transit needs. [facility] Support Programs: @10RELATIVEDAYS @ > Cancer Support Group  2nd Tuesday of the month 1pm-2pm, Journey Room  > Creative Journey  3rd Tuesday of the month 1130am-1pm, Journey Room  > Look Good Feel Better  1st Wednesday of the month 10am-12 noon, Journey Room (Call [facility] to register [phone])

## 2016-04-22 LAB — PATHOLOGIST SMEAR REVIEW

## 2016-04-23 ENCOUNTER — Encounter (HOSPITAL_COMMUNITY): Payer: Self-pay | Admitting: Hematology & Oncology

## 2016-05-04 ENCOUNTER — Encounter (HOSPITAL_COMMUNITY): Payer: Self-pay | Admitting: Hematology & Oncology

## 2016-05-04 ENCOUNTER — Other Ambulatory Visit (HOSPITAL_COMMUNITY)
Admission: RE | Admit: 2016-05-04 | Discharge: 2016-05-04 | Disposition: A | Discharge status: Home / Self Care | Payer: Medicare Other | Source: Ambulatory Visit | Attending: Hematology & Oncology | Admitting: Hematology & Oncology

## 2016-05-04 ENCOUNTER — Encounter (HOSPITAL_COMMUNITY): Payer: Medicare Other

## 2016-05-04 ENCOUNTER — Encounter (HOSPITAL_COMMUNITY): Payer: Medicare Other | Attending: Hematology & Oncology | Admitting: Hematology & Oncology

## 2016-05-04 VITALS — BP 133/84 | HR 68 | Temp 98.4°F | Resp 16 | Wt 163.4 lb

## 2016-05-04 DIAGNOSIS — D72819 Decreased white blood cell count, unspecified: Secondary | ICD-10-CM | POA: Insufficient documentation

## 2016-05-04 DIAGNOSIS — D709 Neutropenia, unspecified: Secondary | ICD-10-CM | POA: Diagnosis not present

## 2016-05-04 HISTORY — DX: Decreased white blood cell count, unspecified: D72.819

## 2016-05-04 NOTE — Patient Instructions (Signed)
[place] at [facility] Discharge Instructions  RECOMMENDATIONS MADE BY THE CONSULTANT AND ANY TEST RESULTS WILL BE SENT TO YOUR REFERRING PHYSICIAN.  Please have Lupus profile drawn today.  CBC in 4 weeks.   Return to clinic in 3 months with a CBC.   Thank you for choosing [place] at [facility] to provide your oncology and hematology care.  To afford each patient quality time with our provider, please arrive at least 15 minutes before your scheduled appointment time.   Beginning January 23rd 2017 lab work for the [facility] will be done in the  Main lab at [facility] on 1st floor. If you have a lab appointment with the [facility] please come in thru the  Main Entrance and check in at the main information desk  You need to re-schedule your appointment should you arrive 10 or more minutes late.  We strive to give you quality time with our providers, and arriving late affects you and other patients whose appointments are after yours.  Also, if you no show three or more times for appointments you may be dismissed from the clinic at the providers discretion.     Again, thank you for choosing [facility].  Our hope is that these requests will decrease the amount of time that you wait before being seen by our physicians.       _____________________________________________________________  Should you have questions after your visit to [facility], please contact our office at [phone] between the hours of 8:30 a.m. and 4:30 p.m.  Voicemails left after 4:30 p.m. will not be returned until the following business day.  For prescription refill requests, have your pharmacy contact our office.         Resources For Cancer Patients and their Caregivers ? American Cancer Society: Can assist with transportation, wigs, general needs, runs Look Good Feel Better.        801-523-8369 ? Cancer Care: Provides  financial assistance, online support groups, medication/co-pay assistance.  1-800-813-HOPE 334-742-1983) ? [place] Assists [place] cancer patients and their families through emotional , educational and financial support.  (949)721-1074 ? [place] DSS Where to apply for food stamps, Medicaid and utility assistance. 216-615-0398 ? [facility]: Transportation to medical appointments. 5086535423 ? Social Security Administration: May apply for disability if have a Stage IV cancer. 802-102-7832 218-376-8440 ? [employer], Disability and Transit Services: Assists with nutrition, care and transit needs. [place] Support Programs: @10RELATIVEDAYS @ > Cancer Support Group  2nd Tuesday of the month 1pm-2pm, Journey Room  > Creative Journey  3rd Tuesday of the month 1130am-1pm, Journey Room  > Look Good Feel Better  1st Wednesday of the month 10am-12 noon, Journey Room (Call [place] to register [phone])

## 2016-05-04 NOTE — Progress Notes (Signed)
[place]  CONSULT NOTE  Patient Care Team: Rosita Fire, MD as PCP - General (Internal Medicine)  CHIEF COMPLAINTS/PURPOSE OF CONSULTATION:  Leukopenia/Neutropenia   HISTORY OF PRESENTING ILLNESS:  Megan [name] 65 y.o. female is here because of leukopenia. She comes to the [facility] today unaccompanied.  Laboratory studies from 03/05/2016 show a total WBC count of 2.8 with ANC of 1361, otherwise normal differential. Remainder of CBC is WNL. Labs dated 07/11/2014 show a WBC count of 4.2 with ANC of 2293. Remainder of differential and CBC is WNL.  She denies fever or frequent illness. She denies new onset fatigue. She notes she is extremely active. She describes herself as constantly on the go. She denies joint pain, cough, abdominal pain, change in bowel or bladder habits.   Labs were obtained from her prior PCP, Dr. Everette Rank dating back to 2011. Total WBC count was mildly low at 3.7 with normal range (4.0-10.5) ANC at 1.6 normal range (1.7-7.7).   She returns today for laboratory review from her initial visit.    MEDICAL HISTORY:  Past Medical History  Diagnosis Date  . Hypertension   . Seasonal allergies   . High cholesterol     SURGICAL HISTORY: Past Surgical History  Procedure Laterality Date  . Bilateral knee arthroscopy    . Right knee osteotomy    . Colonoscopy N/A 07/30/2014    Procedure: COLONOSCOPY;  Surgeon: Danie Binder, MD;  [facility];  Service: Endoscopy;  Laterality: N/A;  9:30 AM  . High cholesterol      SOCIAL HISTORY: Social History   Social History  . Marital Status: Married    Spouse Name: N/A  . Number of Children: N/A  . Years of Education: N/A   Occupational History  . Not on file.   Social History Main Topics  . Smoking status: Never Smoker   . Smokeless tobacco: Not on file  . Alcohol Use: No  . Drug Use: No  . Sexual Activity: Not on file   Other Topics Concern  . Not on file   Social History  Narrative   Born in [place] Married 50 years soon, "in less than 2 years." Married at age 56. 4 children; 3 hers, one adopted. 2 grandbabies, aged 67 and 32. Boys. Non-smoker, no problems with alcohol or drugs. 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 Mom used to work in a Teacher, early years/pre in [place]; Mrs. Zelenka used to help there from time to time. Hobbies include dancing, sewing clothes & drapes, sewing. She notes "oh I have lots of hobbies." Enjoys gardening, cuts the grass, washes the cars, take the grandkids places, etc. She likes to feed the birds. Walks 2 miles a day most of the time; used to walk 5 when she wasn't as busy with her niece.  FAMILY HISTORY: Family History  Problem Relation Age of Onset  . Colon cancer Paternal Uncle   . Hypertension Mother   . Asthma Mother   . Hypertension Brother   . Other Father    Mother & father both deceased Mother was 28 when she died; notes that "she had asthma." Father died from "a rare blood disease" at age 27.  Maternal grandmother lived longer than mother. Says that cancer runs in their family 2 brothers, says "they use alcohol so I really don't know" in terms of their health.  ALLERGIES:  is allergic to other.  MEDICATIONS:  Current Outpatient Prescriptions  Medication Sig Dispense Refill  . acetaminophen (TYLENOL) 325 MG tablet Take 650 mg by mouth every 6 (six) hours as needed. Only as needed    . atorvastatin (LIPITOR) 10 MG tablet Take 10 mg by mouth daily. States she recently restarted taking for high cholesterol    . hydrochlorothiazide (MICROZIDE) 12.5 MG capsule Take 12.5 mg by mouth daily.    . hydrocortisone (PROCTOZONE-HC) 2.5 % rectal cream Place 1 application rectally 2 (two) times daily. For 10 days 30 g 0  . ibuprofen (ADVIL,MOTRIN) 200 MG tablet Take 200 mg by mouth every 6 (six) hours as needed. Only as needed    . lisinopril (PRINIVIL,ZESTRIL) 5 MG tablet Take  5 mg by mouth daily. States she recently restarted taking this for hypertension    . Melatonin 1 MG CAPS Take by mouth. States she recently started taking melatonin at bedtime for sleep but is unsure of dosage     No current facility-administered medications for this visit.    Review of Systems  Constitutional: Negative.  Negative for fever, chills, weight loss and malaise/fatigue.  HENT: Negative.  Negative for congestion, hearing loss, nosebleeds, sore throat and tinnitus.   Eyes: Negative.  Negative for blurred vision, double vision, pain and discharge.  Respiratory: Negative.  Negative for cough, hemoptysis, sputum production, shortness of breath and wheezing.   Cardiovascular: Negative.  Negative for chest pain, palpitations, claudication, leg swelling and PND.  Gastrointestinal: Negative.  Negative for heartburn, nausea, vomiting, abdominal pain, diarrhea, constipation, blood in stool and melena.  Genitourinary: Negative.  Negative for dysuria, urgency, frequency and hematuria.  Musculoskeletal: Negative.  Negative for myalgias, joint pain and falls.  Skin: Negative.  Negative for itching and rash.  Neurological: Negative.  Negative for dizziness, tingling, tremors, sensory change, speech change, focal weakness, seizures, loss of consciousness, weakness and headaches.  Endo/Heme/Allergies: Negative.  Does not bruise/bleed easily.  Psychiatric/Behavioral: Negative.  Negative for depression, suicidal ideas, memory loss and substance abuse. The patient is not nervous/anxious and does not have insomnia.   All other systems reviewed and are negative.  14 point ROS was done and is otherwise as detailed above or in HPI   PHYSICAL EXAMINATION: ECOG PERFORMANCE STATUS: 0 - Asymptomatic  Filed Vitals:   05/04/16 1352  BP: 133/84  Pulse: 68  Temp: 98.4 F (36.9 C)  Resp: 16   Filed Weights   05/04/16 1352  Weight: 163 lb 6.4 oz (74.118 kg)    Physical Exam  Constitutional: She is  oriented to person, place, and time and well-developed, well-nourished, and in no distress.  HENT:  Head: Normocephalic and atraumatic.  Nose: Nose normal.  Mouth/Throat: Oropharynx is clear and moist. No oropharyngeal exudate.  Eyes: Conjunctivae and EOM are normal. Pupils are equal, round, and reactive to light. Right eye exhibits no discharge. Left eye exhibits no discharge. No scleral icterus.  Neck: Normal range of motion. Neck supple. No tracheal deviation present. No thyromegaly present.  Cardiovascular: Normal rate and regular rhythm.  Exam reveals no gallop and no friction rub.   Murmur heard. Very soft murmur, grade I.  Pulmonary/Chest: Effort normal and breath sounds normal. She has no wheezes. She has no rales.  Abdominal: Soft. Bowel sounds are normal. She exhibits no distension and no mass. There is no tenderness. There is no rebound and no guarding.  Musculoskeletal: Normal range of motion. She exhibits no edema.  Lymphadenopathy:    She has no cervical adenopathy.  Neurological: She  is alert and oriented to person, place, and time. She has normal reflexes. No cranial nerve deficit. Gait normal. Coordination normal.  Skin: Skin is warm and dry. No rash noted.  Psychiatric: Mood, memory, affect and judgment normal.  Nursing note and vitals reviewed.   LABORATORY DATA:  I have reviewed the data as listed Lab Results  Component Value Date   WBC 3.7* 04/21/2016   HGB 12.2 04/21/2016   HCT 37.3 04/21/2016   MCV 90.8 04/21/2016   PLT 219 04/21/2016   CMP     Component Value Date/Time   NA 139 04/21/2016 1557   K 4.0 04/21/2016 1557   CL 105 04/21/2016 1557   CO2 29 04/21/2016 1557   GLUCOSE 92 04/21/2016 1557   BUN 12 04/21/2016 1557   CREATININE 0.69 04/21/2016 1557   CALCIUM 10.2 04/21/2016 1557   PROT 8.0 04/21/2016 1557   ALBUMIN 4.4 04/21/2016 1557   AST 21 04/21/2016 1557   ALT 18 04/21/2016 1557   ALKPHOS 75 04/21/2016 1557   BILITOT 0.5 04/21/2016 1557     GFRNONAA >60 04/21/2016 1557   GFRAA >60 04/21/2016 1557   As detailed under HPI  Results for BAREERAH, AMRHEIN (MRN [id number]) as of 05/04/2016 18:45  Ref. Range 04/21/2016 15:57  CRP Latest Ref Range: <1.0 mg/dL 0.7   Results for MERTIE, KROHN (MRN [id number]) as of 05/04/2016 18:45  Ref. 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 I feel findings are benign. We had ordered a lupus profile, this was not performed. I discussed this with the patient, although at this point I feel low yield, we will obtain this today.  We will repeat a CBC with diff in 6 weeks. I will see her back in 3 months. If counts remain stable we will follow over 1 year with discharge after stability shown during that time period   We discussed the following:  Monitoring the [place] - In some cases, isolated neutropenia may evolve to a more serious condition, while in many others the patient may never become ill. Thus, it is important to balance appropriate periodic reevaluation with avoidance of unnecessary worry in an otherwise healthy individual.  Most cases of incidentally discovered neutropenia are transient or constitutional, and patients with a stable absolute neutrophil count (ANC) who are not thought to be at high risk of infection can have their complete blood count (CBC) with differential monitored on a periodic basis as an outpatient, at gradually increasing intervals. Patients with constitutional neutropenia and a long history of stable ANCs do not require further monitoring or evaluation. The following outlines a reasonable tempo of monitoring in an otherwise healthy individual: ?First eight weeks - In patients with moderate or severe neutropenia in whom we do not suspect constitutional neutropenia, it is reasonable to check the CBC with differential and reevaluate the patient at two  week intervals. ?First year - If the neutropenia persists for more than eight weeks without the development of other findings, we extend the interval to approximately every three months. ?After the first year - If the neutropenia is mild (ie, ANC >1000 cells/microL) and there have been no symptoms for over a year, we counsel the patients on the possible symptoms associated with infection and bone marrow failure, and tell them to return if any such symptoms develop. ?Resolution of neutropenia - If the neutropenia  resolves, we follow the patient periodically for one year with evaluation of the CBC whenever fever occurs.   Orders Placed This Encounter  Procedures  . CBC with Differential    Standing Status: Future     Number of Occurrences: 1     Standing Expiration Date: 07/02/2017  . CBC with Differential    Standing Status: Future     Number of Occurrences:      Standing Expiration Date: 05/04/2018  . Systemic Lupus Profile A    Standing Status: Future     Number of Occurrences: 1     Standing Expiration Date: 05/04/2017    All questions were answered. The patient knows to call the clinic with any problems, questions or concerns.  This document serves as a record of services personally performed by Ancil Linsey, MD. It was created on her behalf by Toni Amend, a trained medical scribe. The creation of this record is based on the scribe's personal observations and the provider's statements to them. This document has been checked and approved by the attending provider.  I have reviewed the above documentation for accuracy and completeness and I agree with the above.  This note was electronically signed.    Molli Hazard, MD  05/04/2016 6:37 PM

## 2016-05-05 LAB — MISC LABCORP TEST (SEND OUT): Labcorp test code: 56499

## 2016-06-08 ENCOUNTER — Encounter (HOSPITAL_COMMUNITY): Payer: Medicare Other | Attending: Hematology & Oncology

## 2016-06-08 DIAGNOSIS — D72819 Decreased white blood cell count, unspecified: Secondary | ICD-10-CM | POA: Diagnosis not present

## 2016-06-08 LAB — CBC WITH DIFFERENTIAL/PLATELET
BASOS ABS: 0 10*3/uL (ref 0.0–0.1)
BASOS PCT: 0 %
EOS ABS: 0.3 10*3/uL (ref 0.0–0.7)
EOS PCT: 7 %
HCT: 34.8 % — ABNORMAL LOW (ref 36.0–46.0)
Hemoglobin: 11.5 g/dL — ABNORMAL LOW (ref 12.0–15.0)
Lymphocytes Relative: 31 %
Lymphs Abs: 1.1 10*3/uL (ref 0.7–4.0)
MCH: 29.9 pg (ref 26.0–34.0)
MCHC: 33 g/dL (ref 30.0–36.0)
MCV: 90.6 fL (ref 78.0–100.0)
MONO ABS: 0.3 10*3/uL (ref 0.1–1.0)
MONOS PCT: 9 %
NEUTROS ABS: 1.8 10*3/uL (ref 1.7–7.7)
Neutrophils Relative %: 53 %
PLATELETS: 186 10*3/uL (ref 150–400)
RBC: 3.84 MIL/uL — ABNORMAL LOW (ref 3.87–5.11)
RDW: 13 % (ref 11.5–15.5)
WBC: 3.5 10*3/uL — ABNORMAL LOW (ref 4.0–10.5)

## 2016-07-30 ENCOUNTER — Ambulatory Visit (INDEPENDENT_AMBULATORY_CARE_PROVIDER_SITE_OTHER): Payer: Medicare Other

## 2016-07-30 ENCOUNTER — Encounter: Payer: Self-pay | Admitting: Orthopedic Surgery

## 2016-07-30 ENCOUNTER — Ambulatory Visit (INDEPENDENT_AMBULATORY_CARE_PROVIDER_SITE_OTHER): Payer: Medicare Other | Admitting: Orthopedic Surgery

## 2016-07-30 VITALS — BP 124/81 | HR 68 | Ht 65.0 in | Wt 160.0 lb

## 2016-07-30 DIAGNOSIS — S6992XA Unspecified injury of left wrist, hand and finger(s), initial encounter: Secondary | ICD-10-CM

## 2016-07-30 DIAGNOSIS — S63619A Unspecified sprain of unspecified finger, initial encounter: Secondary | ICD-10-CM | POA: Diagnosis not present

## 2016-07-30 NOTE — Progress Notes (Signed)
Chief Complaint  Patient presents with  . Finger Injury    LEFT LITTLE FINGER INJURY   HPI  ROS  Past Medical History:  Diagnosis Date  . High cholesterol   . Hypertension   . Seasonal allergies     Past Surgical History:  Procedure Laterality Date  . BILATERAL KNEE ARTHROSCOPY    . COLONOSCOPY N/A 07/30/2014   Procedure: COLONOSCOPY;  Surgeon: Danie Binder, MD;  Location[facility];  Service: Endoscopy;  Laterality: N/A;  9:30 AM  . high cholesterol    . Right knee osteotomy     Family History  Problem Relation Age of Onset  . Colon cancer Paternal Uncle   . Hypertension Mother   . Asthma Mother   . Hypertension Brother   . Other Father    Social History  Substance Use Topics  . Smoking status: Never Smoker  . Smokeless tobacco: Not on file  . Alcohol use No    Current Outpatient Prescriptions:  .  atorvastatin (LIPITOR) 10 MG tablet, Take 10 mg by mouth daily. States she recently restarted taking for high cholesterol, Disp: , Rfl:  .  lisinopril (PRINIVIL,ZESTRIL) 5 MG tablet, Take 5 mg by mouth daily. States she recently restarted taking this for hypertension, Disp: , Rfl:   BP 124/81   Pulse 68   Ht 5\' 5"  (1.651 m)   Wt 160 lb (72.6 kg)   BMI 26.63 kg/m   Physical Exam  Constitutional: She is oriented to person, place, and time. She appears well-developed and well-nourished. No distress.  Cardiovascular: Normal rate and intact distal pulses.   Neurological: She is alert and oriented to person, place, and time. She has normal reflexes. She exhibits normal muscle tone. Coordination normal.  Skin: Skin is warm and dry. No rash noted. She is not diaphoretic. No erythema. No pallor.  Psychiatric: She has a normal mood and affect. Her behavior is normal. Judgment and thought content normal.    Ortho Exam Left small finger  Swelling and tenderness PIP joint radial collateral ligament  No joint deformity. Compared to the opposite hand and the passive  flexion position is no rotatory deformity.  Flexion of the FDP and FDS strength is normal. Scans intact without erythema. Good color and pulse are noted in the hand is no lymphadenopathy in that region and a sensation is normal  Right small finger for comparison normal range of motion stability strength skin appearance and pulse and color  ASSESSMENT: My personal interpretation of the images:  Normal x-ray no fracture    PLAN Encounter Diagnoses  Name Primary?  . Injury of left little finger, initial encounter Yes  . Sprain, finger, initial encounter    Ice and ibuprofen  Arther Abbott, MD 07/30/2016 4:57 PM

## 2016-07-30 NOTE — Patient Instructions (Signed)
ICE   IBUPROFEN  

## 2016-08-04 ENCOUNTER — Encounter (HOSPITAL_COMMUNITY): Payer: Self-pay | Admitting: Hematology & Oncology

## 2016-08-04 ENCOUNTER — Encounter (HOSPITAL_COMMUNITY): Payer: Medicare Other | Attending: Hematology & Oncology | Admitting: Hematology & Oncology

## 2016-08-04 ENCOUNTER — Encounter (HOSPITAL_COMMUNITY): Payer: Medicare Other

## 2016-08-04 VITALS — BP 136/86 | HR 72 | Temp 98.2°F | Resp 16 | Wt 164.2 lb

## 2016-08-04 DIAGNOSIS — D709 Neutropenia, unspecified: Secondary | ICD-10-CM | POA: Diagnosis not present

## 2016-08-04 DIAGNOSIS — D72819 Decreased white blood cell count, unspecified: Secondary | ICD-10-CM | POA: Insufficient documentation

## 2016-08-04 DIAGNOSIS — D708 Other neutropenia: Secondary | ICD-10-CM

## 2016-08-04 LAB — CBC
HEMATOCRIT: 35.9 % — AB (ref 36.0–46.0)
Hemoglobin: 11.8 g/dL — ABNORMAL LOW (ref 12.0–15.0)
MCH: 29.8 pg (ref 26.0–34.0)
MCHC: 32.9 g/dL (ref 30.0–36.0)
MCV: 90.7 fL (ref 78.0–100.0)
Platelets: 186 10*3/uL (ref 150–400)
RBC: 3.96 MIL/uL (ref 3.87–5.11)
RDW: 12.8 % (ref 11.5–15.5)
WBC: 3.3 10*3/uL — AB (ref 4.0–10.5)

## 2016-08-04 LAB — DIFFERENTIAL
BASOS PCT: 0 %
Basophils Absolute: 0 10*3/uL (ref 0.0–0.1)
EOS ABS: 0.3 10*3/uL (ref 0.0–0.7)
Eosinophils Relative: 8 %
LYMPHS ABS: 1.2 10*3/uL (ref 0.7–4.0)
LYMPHS PCT: 37 %
Monocytes Absolute: 0.4 10*3/uL (ref 0.1–1.0)
Monocytes Relative: 12 %
NEUTROS ABS: 1.4 10*3/uL — AB (ref 1.7–7.7)
Neutrophils Relative %: 43 %

## 2016-08-04 NOTE — Progress Notes (Signed)
[facility]  CONSULT NOTE  Patient Care Team: Rosita Fire, MD as PCP - General (Internal Medicine)  CHIEF COMPLAINTS/PURPOSE OF CONSULTATION:  Leukopenia/Neutropenia   HISTORY OF PRESENTING ILLNESS:  Megan [name] 65 y.o. female is here for ongoing follow-up of leukopenia. She comes to the [facility] today unaccompanied.  She denies fever or frequent illness. She denies new onset fatigue. She notes she is extremely active. She describes herself as constantly on the go. She denies joint pain, cough, abdominal pain, change in bowel or bladder habits.   She states that she is feeling well and eating well. She denies any fever, chills, or diarrhea. She said she feels like she may have an allergy to certain foods. She also feels like she may have seasonal allergies because she is experiencing cold-like symptoms.   Megan [name] mentions that sometimes she has difficulty sleeping, but she drinks herbal teas and that helps.   Megan [name] asked about the lupus test she was supposed to have done and if she had any signs of lupus. She denies rash, joint pain, hair loss, SOB or CP.    MEDICAL HISTORY:  Past Medical History:  Diagnosis Date  . High cholesterol   . Hypertension   . Seasonal allergies     SURGICAL HISTORY: Past Surgical History:  Procedure Laterality Date  . BILATERAL KNEE ARTHROSCOPY    . COLONOSCOPY N/A 07/30/2014   Procedure: COLONOSCOPY;  Surgeon: Danie Binder, MD;  Location: [facility];  Service: Endoscopy;  Laterality: N/A;  9:30 AM  . high cholesterol    . Right knee osteotomy      SOCIAL HISTORY: Social History   Social History  . Marital status: Married    Spouse name: N/A  . Number of children: N/A  . Years of education: N/A   Occupational History  . Not on file.   Social History Main Topics  . Smoking status: Never Smoker  . Smokeless tobacco: Never Used  . Alcohol use No  . Drug use: No  . Sexual activity: Not on file   Other  Topics Concern  . Not on file   Social History Narrative  . No narrative on file   Born in [place] Married 50 years soon, "in less than 2 years." Married at age 38. 4 children; 3 hers, one adopted. 2 grandbabies, aged 57 and 45. Boys. Non-smoker, no problems with alcohol or drugs. 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 Mom used to work in a Teacher, early years/pre in [place]; Mrs. Stmarie used to help there from time to time. Hobbies include dancing, sewing clothes & drapes, sewing. She notes "oh I have lots of hobbies." Enjoys gardening, cuts the grass, washes the cars, take the grandkids places, etc. She likes to feed the birds. Walks 2 miles a day most of the time; used to walk 5 when she wasn't as busy with her niece.  FAMILY HISTORY: Family History  Problem Relation Age of Onset  . Hypertension Mother   . Asthma Mother   . Hypertension Brother   . Other Father   . Colon cancer Paternal Uncle    Mother & father both deceased Mother was 72 when she died; notes that "she had asthma." Father died from "a rare blood disease" at age 34.  Maternal grandmother lived longer than mother. Says that cancer runs in their family 2 brothers, says "they use alcohol so I really don't know" in  terms of their health.  ALLERGIES:  is allergic to other.  MEDICATIONS:  Current Outpatient Prescriptions  Medication Sig Dispense Refill  . atorvastatin (LIPITOR) 10 MG tablet Take 10 mg by mouth daily. States she recently restarted taking for high cholesterol    . lisinopril (PRINIVIL,ZESTRIL) 5 MG tablet Take 5 mg by mouth daily. States she recently restarted taking this for hypertension     No current facility-administered medications for this visit.     Review of Systems  Constitutional: Negative.  Negative for chills, fever, malaise/fatigue and weight loss.       Food allergies  HENT: Positive for congestion. Negative for hearing loss,  nosebleeds, sore throat and tinnitus.        Cold symptoms (i.e. congestion)   Eyes: Negative.  Negative for blurred vision, double vision, pain and discharge.  Respiratory: Negative.  Negative for cough, hemoptysis, sputum production, shortness of breath and wheezing.   Cardiovascular: Negative.  Negative for chest pain, palpitations, claudication, leg swelling and PND.  Gastrointestinal: Negative.  Negative for abdominal pain, blood in stool, constipation, diarrhea, heartburn, melena, nausea and vomiting.  Genitourinary: Negative.  Negative for dysuria, frequency, hematuria and urgency.  Musculoskeletal: Negative.  Negative for falls, joint pain and myalgias.  Skin: Negative.  Negative for itching and rash.  Neurological: Negative.  Negative for dizziness, tingling, tremors, sensory change, speech change, focal weakness, seizures, loss of consciousness, weakness and headaches.  Endo/Heme/Allergies: Negative.  Does not bruise/bleed easily.  Psychiatric/Behavioral: Negative.  Negative for depression, memory loss, substance abuse and suicidal ideas. The patient is not nervous/anxious and does not have insomnia.   All other systems reviewed and are negative.  14 point ROS was done and is otherwise as detailed above or in HPI    PHYSICAL EXAMINATION: ECOG PERFORMANCE STATUS: 0 - Asymptomatic  Vitals:   08/04/16 0928  BP: 136/86  Pulse: 72  Resp: 16  Temp: 98.2 F (36.8 C)   Filed Weights   08/04/16 0928  Weight: 164 lb 3.2 oz (74.5 kg)    Physical Exam  Constitutional: She is oriented to person, place, and time and well-developed, well-nourished, and in no distress.  HENT:  Head: Normocephalic and atraumatic.  Nose: Nose normal.  Mouth/Throat: Oropharynx is clear and moist. No oropharyngeal exudate.  Eyes: Conjunctivae and EOM are normal. Pupils are equal, round, and reactive to light. Right eye exhibits no discharge. Left eye exhibits no discharge. No scleral icterus.  Neck:  Normal range of motion. Neck supple. No tracheal deviation present. No thyromegaly present.  Cardiovascular: Normal rate and regular rhythm.  Exam reveals no gallop and no friction rub.   No murmur heard. Pulmonary/Chest: Effort normal and breath sounds normal. She has no wheezes. She has no rales.  Abdominal: Soft. Bowel sounds are normal. She exhibits no distension and no mass. There is no tenderness. There is no rebound and no guarding.  Musculoskeletal: Normal range of motion. She exhibits no edema.  Lymphadenopathy:    She has no cervical adenopathy.  Neurological: She is alert and oriented to person, place, and time. She has normal reflexes. No cranial nerve deficit. Gait normal. Coordination normal.  Skin: Skin is warm and dry. No rash noted.  Psychiatric: Mood, memory, affect and judgment normal.  Nursing note and vitals reviewed.   LABORATORY DATA:  I have reviewed the data as listed Lab Results  Component Value Date   WBC 3.3 (L) 08/04/2016   HGB 11.8 (L) 08/04/2016   HCT 35.9 (L)  08/04/2016   MCV 90.7 08/04/2016   PLT 186 08/04/2016   CMP     Component Value Date/Time   NA 139 04/21/2016 1557   K 4.0 04/21/2016 1557   CL 105 04/21/2016 1557   CO2 29 04/21/2016 1557   GLUCOSE 92 04/21/2016 1557   BUN 12 04/21/2016 1557   CREATININE 0.69 04/21/2016 1557   CALCIUM 10.2 04/21/2016 1557   PROT 8.0 04/21/2016 1557   ALBUMIN 4.4 04/21/2016 1557   AST 21 04/21/2016 1557   ALT 18 04/21/2016 1557   ALKPHOS 75 04/21/2016 1557   BILITOT 0.5 04/21/2016 1557   GFRNONAA >60 04/21/2016 1557   GFRAA >60 04/21/2016 1557   As detailed under HPI  Results for ISSIS, HASSING (MRN [id number]) as of 05/04/2016 18:45  Ref. Range 04/21/2016 15:57  CRP Latest Ref Range: <1.0 mg/dL 0.7   Results for ZEAH, VANCLEAVE (MRN [id number]) as of 05/04/2016 18:45  Ref. 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 I feel findings are benign. We had ordered a lupus profile, this was not performed initially, requested a second time, I still cannot locate in EPIC. We will follow-up with this and notify the patient. I do feel again however this will be low yield.   I will recheck in 6 months.  If counts remain stable we will follow over 1 year with discharge after stability shown during that time period   We discussed the following:  Monitoring the [place] - In some cases, isolated neutropenia may evolve to a more serious condition, while in many others the patient may never become ill. Thus, it is important to balance appropriate periodic reevaluation with avoidance of unnecessary worry in an otherwise healthy individual.  Most cases of incidentally discovered neutropenia are transient or constitutional, and patients with a stable absolute neutrophil count (ANC) who are not thought to be at high risk of infection can have their complete blood count (CBC) with differential monitored on a periodic basis as an outpatient, at gradually increasing intervals. Patients with constitutional neutropenia and a long history of stable ANCs do not require further monitoring or evaluation. The following outlines a reasonable tempo of monitoring in an otherwise healthy individual: ?First eight weeks - In patients with moderate or severe neutropenia in whom we do not suspect constitutional neutropenia, it is reasonable to check the CBC with differential and reevaluate the patient at two week intervals. ?First year - If the neutropenia persists for more than eight weeks without the development of other findings, we extend the interval to approximately every three months. ?After the first year - If the neutropenia is mild (ie, ANC >1000 cells/microL) and there have been no symptoms for over a year, we counsel the patients on the possible symptoms associated with infection and bone marrow  failure, and tell them to return if any such symptoms develop. ?Resolution of neutropenia - If the neutropenia resolves, we follow the patient periodically for one year with evaluation of the CBC whenever fever occurs.   Orders Placed This Encounter  Procedures  . Differential    Standing Status:   Future    Number of Occurrences:   1    Standing Expiration Date:   08/04/2017  . CBC with Differential    Standing Status:   Future    Standing Expiration Date:   08/04/2017  All questions were answered. The patient knows to call the clinic with any problems, questions or concerns.  This document serves as a record of services personally performed by Ancil Linsey, MD. It was created on her behalf by Elmyra Ricks, a trained medical scribe. The creation of this record is based on the scribe's personal observations and the provider's statements to them. This document has been checked and approved by the attending provider.  I have reviewed the above documentation for accuracy and completeness and I agree with the above.  This note was electronically signed.    Molli Hazard, MD  08/04/2016 4:03 PM

## 2016-08-04 NOTE — Patient Instructions (Addendum)
[facility] at [facility] Discharge Instructions  RECOMMENDATIONS MADE BY THE CONSULTANT AND ANY TEST RESULTS WILL BE SENT TO YOUR REFERRING PHYSICIAN.  You saw Dr. Whitney Muse today. Follow up in 6 months with Kirby Crigler, PA-C. Labs same day as follow up.  Thank you for choosing [facility] at [facility] to provide your oncology and hematology care.  To afford each patient quality time with our provider, please arrive at least 15 minutes before your scheduled appointment time.   Beginning January 23rd 2017 lab work for the [facility] will be done in the  Main lab at [facility] on 1st floor. If you have a lab appointment with the [facility] please come in thru the  Main Entrance and check in at the main information desk  You need to re-schedule your appointment should you arrive 10 or more minutes late.  We strive to give you quality time with our providers, and arriving late affects you and other patients whose appointments are after yours.  Also, if you no show three or more times for appointments you may be dismissed from the clinic at the providers discretion.     Again, thank you for choosing [facility].  Our hope is that these requests will decrease the amount of time that you wait before being seen by our physicians.       _____________________________________________________________  Should you have questions after your visit to [facility], please contact our office at [phone] between the hours of 8:30 a.m. and 4:30 p.m.  Voicemails left after 4:30 p.m. will not be returned until the following business day.  For prescription refill requests, have your pharmacy contact our office.         Resources For Cancer Patients and their Caregivers ? American Cancer Society: Can assist with transportation, wigs, general needs, runs Look Good Feel Better.        ([phone] ? Cancer Care: Provides  financial assistance, online support groups, medication/co-pay assistance.  1-800-813-HOPE 757-279-3789) ? [facility] Assists [place] cancer patients and their families through emotional , educational and financial support.  9897258138 ? [place] DSS Where to apply for food stamps, Medicaid and utility assistance. (847)439-7233 ? [facility]: Transportation to medical appointments. (743)723-6484 ? Social Security Administration: May apply for disability if have a Stage IV cancer. 914-756-2179 (667)244-5841 ? [employer], Disability and Transit Services: Assists with nutrition, care and transit needs. [facility] Support Programs: @10RELATIVEDAYS @ > Cancer Support Group  2nd Tuesday of the month 1pm-2pm, Journey Room  > Creative Journey  3rd Tuesday of the month 1130am-1pm, Journey Room  > Look Good Feel Better  1st Wednesday of the month 10am-12 noon, Journey Room (Call [facility] to register [phone])

## 2016-12-14 DIAGNOSIS — M791 Myalgia: Secondary | ICD-10-CM | POA: Diagnosis not present

## 2016-12-14 DIAGNOSIS — M542 Cervicalgia: Secondary | ICD-10-CM | POA: Diagnosis not present

## 2017-02-01 ENCOUNTER — Encounter (HOSPITAL_COMMUNITY): Payer: Self-pay | Admitting: Oncology

## 2017-02-01 ENCOUNTER — Other Ambulatory Visit (HOSPITAL_COMMUNITY): Payer: Medicare Other

## 2017-02-01 ENCOUNTER — Ambulatory Visit (HOSPITAL_COMMUNITY): Payer: Medicare Other | Admitting: Oncology

## 2017-02-01 NOTE — Assessment & Plan Note (Deleted)
Benign leukopenia/neutropenia of ethnicity.  Labs today: CBC diff.  I personally reviewed and went over laboratory results with the patient.  The results are noted within this dictation.  Patient is provided education regarding BEN: Benign ethnic neutropenia-The condition of benign ethnic neutropenia (BEN), also called benign familial neutropenia, constitutional neutropenia, or benign familial leukopenia, is an inherited neutropenia that occurs in individuals of African descent, as well as some other ethnic groups (eg, certain Sephardic Jews, [place], [place], and Arab [place]ians). The neutropenia is often mild. Absolute neutrophil counts (ANCs) are most commonly greater than 1200 but may occasionally be less than 1000.  Notably, these individuals have normal bone marrow neutrophil reserve; they do not have an increased incidence of infection despite the lack of leukocytosis during infection in some individuals, and they do not have an increased risk of febrile neutropenia from chemotherapy.  The genetic basis for BEN is a polymorphism in the Duffy antigen receptor chemokine [facility]) gene, which encodes the Duffy antigen, a chemokine receptor expressed on the surface of red blood cells (RBCs).  In blood bank records, the Duffy antigen is denoted Fy(ab). Since the receptor is used by malaria parasites to enter RBCs, this polymorphism is protective against malaria; protection from malaria likely explains the high frequency of BEN in affected populations.  The mechanism of neutropenia in patients with BEN is not well understood, nor is the link between [facility] polymorphisms and neutrophil mass. Neutropenia in individuals with BEN may be due to reduced stimulation of neutrophil production, lack of a storage pool of neutrophils, and/or reduced neutrophil chemotaxis. The null allele of the [facility] gene responsible for BEN results in lack of expression of this chemokine receptor on RBCs and vascular cells; the Duffy  antigen is not normally expressed on neutrophils. Lack of Duffy expression may cause reduced clearance of pro-inflammatory cytokines that would normally be taken up by RBCs expressing the [facility] receptor and reduced neutrophil chemotaxis due to lack of Duffy antigen expression on endothelial cells.  Individuals who are heterozygous for a null allele at the [facility] locus have mean neutrophil counts similar to those of other ethnic ancestries. Homozygotes for the null allele have a mean neutrophil count that is shifted lower than the range in individuals without the mutation. The shift in the distribution curve of neutrophil counts for the population means that some individuals have an [place] that falls below the threshold of 1500.  Because approximately 80 percent of African Blacks are homozygous for the null allele, 10 to 15 percent of these individuals are classified as neutropenic based on ANC. Homozygosity for the null allele is seen in approximately 36 percent of [place]ites. Allele testing for Duffy antigen can be used to confirm a diagnosis of BEN. Benign constitutional neutropenia without DARC polymorphisms also occurs; its mechanism is totally unknown. Patients with a longstanding history of an [place] between 1000 and 1500 have a very benign course, and evaluation of such patients with extensive studies including bone marrow examination is almost uniformly unrevealing. There is no obvious "threshold" [place] below which we feel compelled to look for other sources of neutropenia besides BEN. The best factor determining one's concern for another etiology is the patient's own history of infections and associated conditions, when available. Most hematologists would perform a bone marrow examination on patients with an [place] below 800 however, an [place] of 800 for many years in a completely healthy individual is consistent with BEN.  Of note, it is possible for an individual with BEN to  develop an abnormality of neutrophil production  such as those described in the following sections. Such an abnormality should be suspected in an individual with BEN if a stable neutrophil count decreases dramatically.   Labs in 6 months: CBC diff.    Return in 6 months for follow-up.  If labs are stable in 6 months, it would not be unreasonable to decrease frequency to once per year x 1-2 years and if stable, then release patient from clinic.

## 2017-02-01 NOTE — Progress Notes (Signed)
-

## 2017-03-10 ENCOUNTER — Other Ambulatory Visit (HOSPITAL_COMMUNITY): Payer: Self-pay | Admitting: Internal Medicine

## 2017-03-10 DIAGNOSIS — Z1231 Encounter for screening mammogram for malignant neoplasm of breast: Secondary | ICD-10-CM

## 2017-03-15 ENCOUNTER — Ambulatory Visit (HOSPITAL_COMMUNITY)
Admission: RE | Admit: 2017-03-15 | Discharge: 2017-03-15 | Disposition: A | Discharge status: Home / Self Care | Payer: Medicare Other | Source: Ambulatory Visit | Attending: Internal Medicine | Admitting: Internal Medicine

## 2017-03-15 DIAGNOSIS — Z1231 Encounter for screening mammogram for malignant neoplasm of breast: Secondary | ICD-10-CM | POA: Diagnosis not present

## 2017-05-19 ENCOUNTER — Ambulatory Visit (INDEPENDENT_AMBULATORY_CARE_PROVIDER_SITE_OTHER): Payer: Medicare Other | Admitting: Family Medicine

## 2017-05-19 ENCOUNTER — Encounter: Payer: Self-pay | Admitting: Family Medicine

## 2017-05-19 VITALS — BP 118/64 | HR 68 | Temp 97.7°F | Resp 14 | Ht 65.0 in | Wt 165.0 lb

## 2017-05-19 DIAGNOSIS — E7800 Pure hypercholesterolemia, unspecified: Secondary | ICD-10-CM | POA: Diagnosis not present

## 2017-05-19 DIAGNOSIS — E785 Hyperlipidemia, unspecified: Secondary | ICD-10-CM | POA: Insufficient documentation

## 2017-05-19 DIAGNOSIS — E039 Hypothyroidism, unspecified: Secondary | ICD-10-CM

## 2017-05-19 DIAGNOSIS — E611 Iron deficiency: Secondary | ICD-10-CM | POA: Diagnosis not present

## 2017-05-19 DIAGNOSIS — M5136 Other intervertebral disc degeneration, lumbar region: Secondary | ICD-10-CM

## 2017-05-19 DIAGNOSIS — I10 Essential (primary) hypertension: Secondary | ICD-10-CM

## 2017-05-19 DIAGNOSIS — R5383 Other fatigue: Secondary | ICD-10-CM

## 2017-05-19 DIAGNOSIS — D700 Congenital agranulocytosis: Secondary | ICD-10-CM | POA: Diagnosis not present

## 2017-05-19 DIAGNOSIS — R7303 Prediabetes: Secondary | ICD-10-CM | POA: Diagnosis not present

## 2017-05-19 LAB — IRON: Iron: 79 ug/dL (ref 45–160)

## 2017-05-19 LAB — CBC WITH DIFFERENTIAL/PLATELET
BASOS ABS: 0 {cells}/uL (ref 0–200)
BASOS PCT: 0 %
EOS ABS: 217 {cells}/uL (ref 15–500)
Eosinophils Relative: 7 %
HCT: 37.4 % (ref 35.0–45.0)
HEMOGLOBIN: 12.3 g/dL (ref 12.0–15.0)
LYMPHS ABS: 1023 {cells}/uL (ref 850–3900)
Lymphocytes Relative: 33 %
MCH: 29.5 pg (ref 27.0–33.0)
MCHC: 32.9 g/dL (ref 32.0–36.0)
MCV: 89.7 fL (ref 80.0–100.0)
MONOS PCT: 10 %
MPV: 11.2 fL (ref 7.5–12.5)
Monocytes Absolute: 310 cells/uL (ref 200–950)
NEUTROS ABS: 1550 {cells}/uL (ref 1500–7800)
Neutrophils Relative %: 50 %
PLATELETS: 216 10*3/uL (ref 140–400)
RBC: 4.17 MIL/uL (ref 3.80–5.10)
RDW: 13.2 % (ref 11.0–15.0)
WBC: 3.1 10*3/uL — ABNORMAL LOW (ref 3.8–10.8)

## 2017-05-19 LAB — COMPREHENSIVE METABOLIC PANEL
ALBUMIN: 4.3 g/dL (ref 3.6–5.1)
ALK PHOS: 71 U/L (ref 33–130)
ALT: 16 U/L (ref 6–29)
AST: 20 U/L (ref 10–35)
BUN: 14 mg/dL (ref 7–25)
CHLORIDE: 103 mmol/L (ref 98–110)
CO2: 25 mmol/L (ref 20–31)
CREATININE: 1.05 mg/dL — AB (ref 0.50–0.99)
Calcium: 10.1 mg/dL (ref 8.6–10.4)
Glucose, Bld: 76 mg/dL (ref 70–99)
POTASSIUM: 4.5 mmol/L (ref 3.5–5.3)
SODIUM: 139 mmol/L (ref 135–146)
TOTAL PROTEIN: 7 g/dL (ref 6.1–8.1)
Total Bilirubin: 0.4 mg/dL (ref 0.2–1.2)

## 2017-05-19 LAB — T4, FREE: Free T4: 1 ng/dL (ref 0.8–1.8)

## 2017-05-19 LAB — TSH: TSH: 2.36 mIU/L

## 2017-05-19 LAB — T3, FREE: T3, Free: 2.9 pg/mL (ref 2.3–4.2)

## 2017-05-19 MED ORDER — CYANOCOBALAMIN 500 MCG PO TABS: 500.0000 ug | ORAL_TABLET | Freq: Every day | ORAL | Status: DC

## 2017-05-19 MED ORDER — VITAMIN C 500 MG PO TABS: 500.0000 mg | ORAL_TABLET | Freq: Every day | ORAL | Status: DC

## 2017-05-19 MED ORDER — CHOLECALCIFEROL 25 MCG (1000 UT) PO CAPS: 1000.0000 [IU] | ORAL_CAPSULE | Freq: Every day | ORAL | Status: DC

## 2017-05-19 NOTE — Progress Notes (Signed)
Subjective:    Patient ID: Megan [name], female    DOB: 1951/11/02, 66 y.o.   MRN: [id number]  Patient presents for [facility] (is not fasting) Previous primary care provider Dr. Legrand Rams. Medication history reviewed family history reviewed.  Immunizations she states that her pneumonia vaccines are up-to-date she declines shingles vaccine and flu shots. She also had a [facility]  She is concenred about fatigue and her weight going up, she typically weighs between 145-155;she has been eating   More due to some stressors with  Husband and has not been exercising like she typically does. She is very active in the yard. She has history of multiple things she feels may be contributing to her fatigue including borderline diabetes especially with her weight being up, hypothyroidism, leukopenia, iron deficiency anemia  She denies any polyuria polydipsia denies any chest pain or shortness of breath no recent illness.  HTN- on lisinopril Hyperlipidemia Takes Lipitor 10 mg 4 times a week feels like it causes pain in her hands she has known osteoarthritis. Has borderline diabetes- that his surface within the past year or so.  Has DDD and scoliosis in spine and hands,  History of bilat knee surgery, replacement  Leukopenia- present for many years her white blood cells are typically in the threes. I did review her most recent oncology notes he felt like this is benign and likely congenital.    Mammogram- has fibrocystic changes  Colonoscopy in 2015 - had 1 polyp and intermal hemorrhoids  PAP smear 4 years ago - Normal   Mag for constipation Take b12/B6- for energy  History of hypothyroidism- she stopped medication years ago    Review Of Systems:  GEN- + fatigue, denies fever, weight loss,weakness, recent illness HEENT- denies eye drainage, change in vision, nasal discharge, CVS- denies chest pain, palpitations RESP- denies SOB,  cough, wheeze ABD- denies N/V, change in stools, abd pain GU- denies dysuria, hematuria, dribbling, incontinence MSK- denies joint pain, muscle aches, injury Neuro- denies headache, dizziness, syncope, seizure activity       Objective:    BP 118/64   Pulse 68   Temp 97.7 F (36.5 C) (Oral)   Resp 14   Ht 5\' 5"  (1.651 m)   Wt 165 lb (74.8 kg)   SpO2 96%   BMI 27.46 kg/m  GEN- NAD, alert and oriented x3 HEENT- PERRL, EOMI, non injected sclera, pink conjunctiva, MMM, oropharynx clear, LEFT eye has film inner portio Neck- Supple, no thyromegaly,no bruit  CVS- RRR, no murmur RESP-CTAB ABD-NABS,soft,NT,ND Psych- Normal affect and mood MSK- mild curvature of hands with small nodules at DIP right hand  EXT- No edema Pulses- Radial, DP- 2+        Assessment & Plan:      Problem List Items Addressed This Visit    Leukopenia - Primary    Hematology note will continue to monitor her white blood cell count this is not thought to be benign low reconsult if needed.      Iron deficiency    She states arm randomly will check her iron stores she has multiple factors that can contribute into her fatigue.      Relevant Orders   Iron   Hypothyroidism    She has history of hypothyroidism and declined treatment herself. All recheck her level this may be causing her fatigue.      Relevant Orders   TSH   T3,  free   T4, free   Hypertension    Blood pressure is controlled      Relevant Orders   CBC with Differential/Platelet   Comprehensive metabolic panel   Hyperlipidemia   DDD (degenerative disc disease), lumbar   Borderline diabetes    Check A1c with her increased weight gain also obtain records from her previous PCP. She is not on any active treatment for this.      Relevant Orders   Hemoglobin A1c    Other Visit Diagnoses    Fatigue, unspecified type          Note: This dictation was prepared with Dragon dictation along with smaller phrase technology. Any  transcriptional errors that result from this process are unintentional.

## 2017-05-19 NOTE — Assessment & Plan Note (Signed)
She states arm randomly will check her iron stores she has multiple factors that can contribute into her fatigue.

## 2017-05-19 NOTE — Patient Instructions (Addendum)
Release of records- Dr. Legrand Rams F/U 4 months

## 2017-05-19 NOTE — Assessment & Plan Note (Signed)
Hematology note will continue to monitor her white blood cell count this is not thought to be benign low reconsult if needed.

## 2017-05-19 NOTE — Assessment & Plan Note (Signed)
She has history of hypothyroidism and declined treatment herself. All recheck her level this may be causing her fatigue.

## 2017-05-19 NOTE — Assessment & Plan Note (Signed)
Check A1c with her increased weight gain also obtain records from her previous PCP. She is not on any active treatment for this.

## 2017-05-19 NOTE — Assessment & Plan Note (Signed)
Blood pressure is controlled

## 2017-05-20 LAB — HEMOGLOBIN A1C
HEMOGLOBIN A1C: 5.8 % — AB (ref <5.7)
MEAN PLASMA GLUCOSE: 120 mg/dL

## 2017-06-18 ENCOUNTER — Ambulatory Visit (INDEPENDENT_AMBULATORY_CARE_PROVIDER_SITE_OTHER): Payer: Medicare Other | Admitting: Family Medicine

## 2017-06-18 ENCOUNTER — Encounter: Payer: Self-pay | Admitting: Family Medicine

## 2017-06-18 ENCOUNTER — Ambulatory Visit (HOSPITAL_COMMUNITY)
Admission: RE | Admit: 2017-06-18 | Discharge: 2017-06-18 | Disposition: A | Discharge status: Home / Self Care | Payer: Medicare Other | Source: Ambulatory Visit | Attending: Family Medicine | Admitting: Family Medicine

## 2017-06-18 ENCOUNTER — Other Ambulatory Visit: Payer: Self-pay | Admitting: *Deleted

## 2017-06-18 VITALS — BP 116/72 | HR 72 | Temp 97.9°F | Resp 14 | Ht 65.0 in | Wt 167.0 lb

## 2017-06-18 DIAGNOSIS — R109 Unspecified abdominal pain: Secondary | ICD-10-CM

## 2017-06-18 DIAGNOSIS — Z87442 Personal history of urinary calculi: Secondary | ICD-10-CM | POA: Diagnosis not present

## 2017-06-18 DIAGNOSIS — N2889 Other specified disorders of kidney and ureter: Secondary | ICD-10-CM | POA: Insufficient documentation

## 2017-06-18 DIAGNOSIS — R319 Hematuria, unspecified: Secondary | ICD-10-CM | POA: Diagnosis not present

## 2017-06-18 DIAGNOSIS — N1330 Unspecified hydronephrosis: Secondary | ICD-10-CM

## 2017-06-18 DIAGNOSIS — N281 Cyst of kidney, acquired: Secondary | ICD-10-CM

## 2017-06-18 DIAGNOSIS — R1012 Left upper quadrant pain: Secondary | ICD-10-CM | POA: Diagnosis not present

## 2017-06-18 LAB — URINALYSIS, ROUTINE W REFLEX MICROSCOPIC
Bilirubin Urine: NEGATIVE
GLUCOSE, UA: NEGATIVE
KETONES UR: NEGATIVE
NITRITE: NEGATIVE
PH: 5.5 (ref 5.0–8.0)
Protein, ur: NEGATIVE
SPECIFIC GRAVITY, URINE: 1.02 (ref 1.001–1.035)

## 2017-06-18 LAB — URINALYSIS, MICROSCOPIC ONLY
Bacteria, UA: NONE SEEN [HPF]
CASTS: NONE SEEN [LPF]
CRYSTALS: NONE SEEN [HPF]
Yeast: NONE SEEN [HPF]

## 2017-06-18 MED ORDER — LISINOPRIL 10 MG PO TABS: 10.0000 mg | ORAL_TABLET | Freq: Every day | ORAL | 1 refills | Status: DC

## 2017-06-18 MED ORDER — TRAMADOL HCL 50 MG PO TABS: ORAL_TABLET | ORAL | 0 refills | Status: DC

## 2017-06-18 MED ORDER — ATORVASTATIN CALCIUM 10 MG PO TABS: 10.0000 mg | ORAL_TABLET | Freq: Every day | ORAL | 2 refills | Status: DC

## 2017-06-18 NOTE — Patient Instructions (Signed)
Get CT SCAN DONE Take pain medication as prescribed We will call with lab results F/U pending results

## 2017-06-18 NOTE — Progress Notes (Signed)
   Subjective:    Patient ID: Megan [name], female    DOB: 03/10/1951, 66 y.o.   MRN: [id number]  Patient presents for L Flank Pain (states that she thinks she pulled msucle in side)   Patient here with worsening left flank pain. She's had intermittent episodes in the past. She has this history of significant kidney stones but her last one was about 4 years ago. She states she was treated for musculoskeletal pain by her previous PCP about 3 months ago at that time she used some type of pain patch and took ibuprofen and finally used up. She denies any blood in her urine denies any pain or pressure with urination. Her pain however is progressive worsening over the past week in her left flank. She has taken ibuprofen Tylenol nothing seems to help she's also use heating pad. Denies nausea vomiting no fever no change in her bowels.  She states also told she has renal cyst in past   Family has history of kidney stones   Review Of Systems:  GEN- denies fatigue, fever, weight loss,weakness, recent illness HEENT- denies eye drainage, change in vision, nasal discharge, CVS- denies chest pain, palpitations RESP- denies SOB, cough, wheeze ABD- denies N/V, change in stools, +abd pain GU- denies dysuria, hematuria, dribbling, incontinence MSK- denies joint pain, muscle aches, injury Neuro- denies headache, dizziness, syncope, seizure activity       Objective:    BP 116/72   Pulse 72   Temp 97.9 F (36.6 C) (Oral)   Resp 14   Ht 5\' 5"  (1.651 m)   Wt 167 lb (75.8 kg)   SpO2 96%   BMI 27.79 kg/m  GEN- NAD, alert and oriented x3 CVS- RRR, no murmur RESP-CTAB ABD-NABS,softTTP left flank, mild CVA tenderness, no rebound, no guarding EXT- No edema Pulses- Radial 2+        Assessment & Plan:      Problem List Items Addressed This Visit    None    Visit Diagnoses    Left flank pain    -  Primary   DD 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 UA in office, CT renal stone to be done, given Ultram for pain   Relevant Orders   Urinalysis, Routine w reflex microscopic   CT RENAL STONE STUDY   History of kidney stones       Relevant Orders   CT RENAL STONE STUDY      Note: This dictation was prepared with Dragon dictation along with smaller phrase technology. Any transcriptional errors that result from this process are unintentional.

## 2017-06-22 ENCOUNTER — Telehealth: Payer: Self-pay | Admitting: Family Medicine

## 2017-06-22 NOTE — Telephone Encounter (Signed)
Advised to take as directed on bottle. Do not take more than 3g per day.

## 2017-06-22 NOTE — Telephone Encounter (Signed)
Patient is calling to say she cannot take the tramadol that was prescribed to her, per christina the patient should take tylenol, however patient does not know how much tylenol to take, please call and advise  [phone]

## 2017-06-25 ENCOUNTER — Encounter: Payer: Self-pay | Admitting: Urology

## 2017-06-25 ENCOUNTER — Ambulatory Visit (INDEPENDENT_AMBULATORY_CARE_PROVIDER_SITE_OTHER): Payer: Medicare Other | Admitting: Urology

## 2017-06-25 VITALS — BP 125/82 | HR 69 | Ht 65.0 in | Wt 163.8 lb

## 2017-06-25 DIAGNOSIS — R109 Unspecified abdominal pain: Secondary | ICD-10-CM

## 2017-06-25 DIAGNOSIS — N281 Cyst of kidney, acquired: Secondary | ICD-10-CM | POA: Diagnosis not present

## 2017-06-25 DIAGNOSIS — N1330 Unspecified hydronephrosis: Secondary | ICD-10-CM | POA: Diagnosis not present

## 2017-06-25 LAB — URINALYSIS, COMPLETE
Bilirubin, UA: NEGATIVE
Glucose, UA: NEGATIVE
Ketones, UA: NEGATIVE
NITRITE UA: NEGATIVE
PH UA: 5.5 (ref 5.0–7.5)
Protein, UA: NEGATIVE
Specific Gravity, UA: 1.02 (ref 1.005–1.030)
Urobilinogen, Ur: 0.2 mg/dL (ref 0.2–1.0)

## 2017-06-25 LAB — MICROSCOPIC EXAMINATION

## 2017-06-25 NOTE — Progress Notes (Signed)
06/25/2017 8:55 AM   Megan [name] [birth year] [id number]  Referring provider: Alycia Rossetti, MD [address] [place], [place] [address]  CC: Left flank pain  HPI: The patient is a 66 year old female with past medical history significant for nephrolithiasis presents today for evaluation of left flank pain. The pain has been present 2-3 weeks. Movement makes it worse. Nothing makes it better. She is been taking Tylenol for this. She does not feel like it has gotten better. She does have a history of nephrolithiasis though this does not feel like a previous stone. She does also note that she has a history of 2 renal cyst on her right kidney that were diagnosed 15 years ago. At that time she was told that no intervention was required.  She underwent a CT stone protocol which showed no nephrolithiasis in her left kidney was entirely normal. She had an 8 cm peripelvic cyst and 5 cm lower pole cyst on the right side with no other abnormalities. The radiologist read that there was mild right hydronephrosis but I do not appreciate any clinically significant hydronephrosis on my review of imaging.  PMH: Past Medical History:  Diagnosis Date  . High cholesterol   . Hypertension   . Kidney stone   . Leukopenia 05/04/2016  . Seasonal allergies     Surgical History: Past Surgical History:  Procedure Laterality Date  . BILATERAL KNEE ARTHROSCOPY    . COLONOSCOPY N/A 07/30/2014   Procedure: COLONOSCOPY;  Surgeon: Danie Binder, MD;  Location: [facility];  Service: Endoscopy;  Laterality: N/A;  9:30 AM  . high cholesterol    . Right knee osteotomy      Home Medications:  Allergies as of 06/25/2017      Reactions   Other Other (See Comments)   Nuts - pt states sometimes makes her gout flare up      Medication List       Accurate as of 06/25/17  8:55 AM. Always use your most recent med list.          atorvastatin 10 MG tablet Commonly known as:  LIPITOR Take 1 tablet (10 mg  total) by mouth daily.   Cholecalciferol 1000 units capsule Commonly known as:  D3 HIGH POTENCY Take 1 capsule (1,000 Units total) by mouth daily.   cyanocobalamin 500 MCG tablet Commonly known as:  V-R VITAMIN B-12 Take 1 tablet (500 mcg total) by mouth daily.   lisinopril 10 MG tablet Commonly known as:  PRINIVIL,ZESTRIL Take 1 tablet (10 mg total) by mouth daily.   traMADol 50 MG tablet Commonly known as:  ULTRAM Take 1-2 tablets every 8 hours as needed for pain   vitamin C 500 MG tablet Commonly known as:  ASCORBIC ACID Take 1 tablet (500 mg total) by mouth daily.       Allergies:  Allergies  Allergen Reactions  . Other Other (See Comments)    Nuts - pt states sometimes makes her gout flare up    Family History: Family History  Problem Relation Age of Onset  . Hypertension Mother   . Asthma Mother   . Hypertension Brother   . Other Father        Rare blood disease  . Colon cancer Paternal Uncle   . Bladder Cancer Neg Hx   . Kidney cancer Neg Hx     Social History:  reports that she has never smoked. She has never used smokeless tobacco. She reports  that she does not drink alcohol or use drugs.  ROS: UROLOGY Frequent Urination?: No Hard to postpone urination?: No Burning/pain with urination?: No Get up at night to urinate?: No Leakage of urine?: No Urine stream starts and stops?: No Trouble starting stream?: No Do you have to strain to urinate?: No Blood in urine?: Yes Urinary tract infection?: No Sexually transmitted disease?: No Injury to kidneys or bladder?: No Painful intercourse?: No Weak stream?: No Currently pregnant?: No Vaginal bleeding?: No Last menstrual period?: n  Gastrointestinal Nausea?: No Vomiting?: No Indigestion/heartburn?: No Diarrhea?: No Constipation?: No  Constitutional Fever: No Night sweats?: No Weight loss?: No Fatigue?: No  Skin Skin rash/lesions?: No Itching?: No  Eyes Blurred vision?: No Double  vision?: No  Ears/Nose/Throat Sore throat?: No Sinus problems?: Yes  Hematologic/Lymphatic Swollen glands?: No Easy bruising?: No  Cardiovascular Leg swelling?: No Chest pain?: No  Respiratory Cough?: No Shortness of breath?: No  Endocrine Excessive thirst?: No  Musculoskeletal Back pain?: Yes Joint pain?: Yes  Neurological Headaches?: No Dizziness?: No  Psychologic Depression?: No Anxiety?: No  Physical Exam: BP 125/82 (BP Location: Left Arm, Patient Position: Sitting, Cuff Size: Normal)   Pulse 69   Ht 5\' 5"  (1.651 m)   Wt 163 lb 12.8 oz (74.3 kg)   BMI 27.26 kg/m   Constitutional:  Alert and oriented, No acute distress. HEENT: [place] AT, moist mucus membranes.  Trachea midline, no masses. Cardiovascular: No clubbing, cyanosis, or edema. Respiratory: Normal respiratory effort, no increased work of breathing. GI: Abdomen is soft, nontender, nondistended, no abdominal masses GU: No CVA tenderness.  Skin: No rashes, bruises or suspicious lesions. Lymph: No cervical or inguinal adenopathy. Neurologic: Grossly intact, no focal deficits, moving all 4 extremities. Psychiatric: Normal mood and affect.  Laboratory Data: Lab Results  Component Value Date   WBC 3.1 (L) 05/19/2017   HGB 12.3 05/19/2017   HCT 37.4 05/19/2017   MCV 89.7 05/19/2017   PLT 216 05/19/2017    Lab Results  Component Value Date   CREATININE 1.05 (H) 05/19/2017    No results found for: PSA  No results found for: TESTOSTERONE  Lab Results  Component Value Date   HGBA1C 5.8 (H) 05/19/2017    Urinalysis    Component Value Date/Time   COLORURINE YELLOW 06/18/2017 [place] 06/18/2017 1241   LABSPEC 1.020 06/18/2017 1241   PHURINE 5.5 06/18/2017 1241   GLUCOSEU NEGATIVE 06/18/2017 1241   HGBUR TRACE (A) 06/18/2017 1241   BILIRUBINUR NEGATIVE 06/18/2017 1241   KETONESUR NEGATIVE 06/18/2017 1241   PROTEINUR NEGATIVE 06/18/2017 1241   NITRITE NEGATIVE 06/18/2017  1241   LEUKOCYTESUR TRACE (A) 06/18/2017 1241    Pertinent Imaging: CT reviewed as above.  Assessment & Plan:    1. Left flank pain This is not urological in nature. Favor musculoskeletal in origin. Recommend icing and over-the-counter pain control.  2. Right renal cysts These are benign and do not require further intervention as they are asymptomatic. She can follow-up with [place] as needed.   Return if symptoms worsen or fail to improve.  Nickie Retort, MD  [facility] [address] [place], [place] [address] [phone]

## 2017-06-28 DIAGNOSIS — M791 Myalgia: Secondary | ICD-10-CM | POA: Diagnosis not present

## 2017-06-28 DIAGNOSIS — I10 Essential (primary) hypertension: Secondary | ICD-10-CM | POA: Diagnosis not present

## 2017-06-28 DIAGNOSIS — M549 Dorsalgia, unspecified: Secondary | ICD-10-CM | POA: Diagnosis not present

## 2017-07-13 ENCOUNTER — Other Ambulatory Visit (HOSPITAL_COMMUNITY): Payer: Self-pay | Admitting: Internal Medicine

## 2017-07-13 DIAGNOSIS — R7303 Prediabetes: Secondary | ICD-10-CM | POA: Diagnosis not present

## 2017-07-13 DIAGNOSIS — E785 Hyperlipidemia, unspecified: Secondary | ICD-10-CM | POA: Diagnosis not present

## 2017-07-13 DIAGNOSIS — I10 Essential (primary) hypertension: Secondary | ICD-10-CM | POA: Diagnosis not present

## 2017-07-13 DIAGNOSIS — N390 Urinary tract infection, site not specified: Secondary | ICD-10-CM | POA: Diagnosis not present

## 2017-07-13 DIAGNOSIS — Z0000 Encounter for general adult medical examination without abnormal findings: Secondary | ICD-10-CM | POA: Diagnosis not present

## 2017-07-13 DIAGNOSIS — Z1389 Encounter for screening for other disorder: Secondary | ICD-10-CM | POA: Diagnosis not present

## 2017-07-13 DIAGNOSIS — Z780 Asymptomatic menopausal state: Secondary | ICD-10-CM

## 2017-07-14 ENCOUNTER — Ambulatory Visit (INDEPENDENT_AMBULATORY_CARE_PROVIDER_SITE_OTHER): Payer: Medicare Other

## 2017-07-14 ENCOUNTER — Telehealth: Payer: Self-pay

## 2017-07-14 DIAGNOSIS — R300 Dysuria: Secondary | ICD-10-CM | POA: Diagnosis not present

## 2017-07-14 NOTE — Progress Notes (Signed)
Pt presented today with c/o urinary frequency, hard to postpone urination, dysuria, gross hematuria, flank pain, lower abd pain, and random day sweats. A clean catch urine was obtained for u/a and cx.   Blood pressure 134/86, pulse 71, height 5\' 4"  (1.626 m), weight 164 lb 1.6 oz (74.4 kg).

## 2017-07-14 NOTE — Telephone Encounter (Signed)
Pt called stating she is having left flank pain, dysuria, and gross hematuria. Pt stated that she has been putting ice on her back and now has freezer burn and the skin is peeling off. Pt stated that she feels like she has a UTI. Offered pt a nurse visit appt for today. Pt refused stating she could come tomorrow. Pt added to nurse schedule for tomorrow.

## 2017-07-15 ENCOUNTER — Ambulatory Visit: Payer: Self-pay

## 2017-07-15 LAB — URINALYSIS, COMPLETE
Bilirubin, UA: NEGATIVE
Glucose, UA: NEGATIVE
Nitrite, UA: NEGATIVE
PH UA: 5.5 (ref 5.0–7.5)
SPEC GRAV UA: 1.02 (ref 1.005–1.030)
Urobilinogen, Ur: 2 mg/dL — ABNORMAL HIGH (ref 0.2–1.0)

## 2017-07-15 LAB — MICROSCOPIC EXAMINATION
EPITHELIAL CELLS (NON RENAL): NONE SEEN /HPF (ref 0–10)
RBC, UA: >30 /hpf — ABNORMAL HIGH (ref 0–?)

## 2017-07-16 ENCOUNTER — Emergency Department (HOSPITAL_COMMUNITY)
Admission: EM | Admit: 2017-07-16 | Discharge: 2017-07-16 | Disposition: A | Discharge status: Home / Self Care | Payer: Medicare Other | Attending: Emergency Medicine | Admitting: Emergency Medicine

## 2017-07-16 ENCOUNTER — Encounter (HOSPITAL_COMMUNITY): Payer: Self-pay

## 2017-07-16 ENCOUNTER — Emergency Department (HOSPITAL_COMMUNITY): Payer: Medicare Other

## 2017-07-16 DIAGNOSIS — Z79899 Other long term (current) drug therapy: Secondary | ICD-10-CM | POA: Insufficient documentation

## 2017-07-16 DIAGNOSIS — R310 Gross hematuria: Secondary | ICD-10-CM | POA: Diagnosis not present

## 2017-07-16 DIAGNOSIS — E039 Hypothyroidism, unspecified: Secondary | ICD-10-CM | POA: Insufficient documentation

## 2017-07-16 DIAGNOSIS — R110 Nausea: Secondary | ICD-10-CM | POA: Insufficient documentation

## 2017-07-16 DIAGNOSIS — I10 Essential (primary) hypertension: Secondary | ICD-10-CM | POA: Diagnosis not present

## 2017-07-16 DIAGNOSIS — R1031 Right lower quadrant pain: Secondary | ICD-10-CM | POA: Diagnosis not present

## 2017-07-16 DIAGNOSIS — R1110 Vomiting, unspecified: Secondary | ICD-10-CM | POA: Diagnosis not present

## 2017-07-16 DIAGNOSIS — R1030 Lower abdominal pain, unspecified: Secondary | ICD-10-CM | POA: Diagnosis present

## 2017-07-16 DIAGNOSIS — R109 Unspecified abdominal pain: Secondary | ICD-10-CM | POA: Diagnosis not present

## 2017-07-16 LAB — COMPREHENSIVE METABOLIC PANEL
ALK PHOS: 75 U/L (ref 38–126)
ALT: 28 U/L (ref 14–54)
AST: 25 U/L (ref 15–41)
Albumin: 3.9 g/dL (ref 3.5–5.0)
Anion gap: 8 (ref 5–15)
BILIRUBIN TOTAL: 0.3 mg/dL (ref 0.3–1.2)
BUN: 18 mg/dL (ref 6–20)
CALCIUM: 10.3 mg/dL (ref 8.9–10.3)
CO2: 27 mmol/L (ref 22–32)
Chloride: 103 mmol/L (ref 101–111)
Creatinine, Ser: 0.79 mg/dL (ref 0.44–1.00)
GFR calc Af Amer: >60 mL/min (ref >60)
GLUCOSE: 132 mg/dL — AB (ref 65–99)
POTASSIUM: 4.1 mmol/L (ref 3.5–5.1)
Sodium: 138 mmol/L (ref 135–145)
TOTAL PROTEIN: 7.5 g/dL (ref 6.5–8.1)

## 2017-07-16 LAB — URINALYSIS, ROUTINE W REFLEX MICROSCOPIC
BILIRUBIN URINE: NEGATIVE
Glucose, UA: NEGATIVE mg/dL
KETONES UR: NEGATIVE mg/dL
NITRITE: NEGATIVE
PROTEIN: 30 mg/dL — AB
Specific Gravity, Urine: 1.025 (ref 1.005–1.030)
pH: 6.5 (ref 5.0–8.0)

## 2017-07-16 LAB — URINALYSIS, MICROSCOPIC (REFLEX): Squamous Epithelial / LPF: NONE SEEN

## 2017-07-16 LAB — CBC
HEMATOCRIT: 36.6 % (ref 36.0–46.0)
Hemoglobin: 12.3 g/dL (ref 12.0–15.0)
MCH: 30.7 pg (ref 26.0–34.0)
MCHC: 33.6 g/dL (ref 30.0–36.0)
MCV: 91.3 fL (ref 78.0–100.0)
PLATELETS: 214 10*3/uL (ref 150–400)
RBC: 4.01 MIL/uL (ref 3.87–5.11)
RDW: 12.5 % (ref 11.5–15.5)
WBC: 6.3 10*3/uL (ref 4.0–10.5)

## 2017-07-16 LAB — LIPASE, BLOOD: LIPASE: 24 U/L (ref 11–51)

## 2017-07-16 MED ORDER — ONDANSETRON HCL 4 MG/2ML IJ SOLN
INTRAMUSCULAR | Status: AC
  Filled 2017-07-16: qty 2

## 2017-07-16 MED ORDER — ONDANSETRON HCL 4 MG/2ML IJ SOLN
4.0000 mg | Freq: Once | INTRAMUSCULAR | Status: AC
  Administered 2017-07-16: 4 mg via INTRAVENOUS

## 2017-07-16 MED ORDER — SODIUM CHLORIDE 0.9 % IV BOLUS (SEPSIS)
500.0000 mL | Freq: Once | INTRAVENOUS | Status: AC
  Administered 2017-07-16: 500 mL via INTRAVENOUS

## 2017-07-16 MED ORDER — IOPAMIDOL (ISOVUE-300) INJECTION 61%
30.0000 mL | Freq: Once | INTRAVENOUS | Status: AC | PRN
  Administered 2017-07-16: 30 mL via ORAL

## 2017-07-16 MED ORDER — ONDANSETRON 4 MG PO TBDP: 4.0000 mg | ORAL_TABLET | Freq: Three times a day (TID) | ORAL | 1 refills | Status: DC | PRN

## 2017-07-16 MED ORDER — HYDROMORPHONE HCL 1 MG/ML IJ SOLN: 1.0000 mg | Freq: Once | INTRAMUSCULAR | Status: DC

## 2017-07-16 MED ORDER — IOPAMIDOL (ISOVUE-300) INJECTION 61%
100.0000 mL | Freq: Once | INTRAVENOUS | Status: AC | PRN
  Administered 2017-07-16: 100 mL via INTRAVENOUS

## 2017-07-16 MED ORDER — SODIUM CHLORIDE 0.9 % IV SOLN
INTRAVENOUS | Status: DC
  Administered 2017-07-16: 08:00:00 via INTRAVENOUS

## 2017-07-16 MED ORDER — FENTANYL CITRATE (PF) 100 MCG/2ML IJ SOLN
25.0000 ug | Freq: Once | INTRAMUSCULAR | Status: AC
Start: 2017-07-16 — End: 2017-07-16
  Administered 2017-07-16: 25 ug via INTRAVENOUS
  Filled 2017-07-16: qty 2

## 2017-07-16 MED ORDER — CEPHALEXIN 500 MG PO CAPS: 500.0000 mg | ORAL_CAPSULE | Freq: Four times a day (QID) | ORAL | 0 refills | Status: DC

## 2017-07-16 MED ORDER — HYDROMORPHONE HCL 4 MG PO TABS: 4.0000 mg | ORAL_TABLET | Freq: Four times a day (QID) | ORAL | 0 refills | Status: DC | PRN

## 2017-07-16 NOTE — ED Triage Notes (Signed)
Pain started this am approx 0330, across lower abd, nausea, states she has been on macrodantin for a couple of days for uti.

## 2017-07-16 NOTE — ED Notes (Signed)
EKG done. Gave EKG to Nurse Ron EDP in room with another patient doing a procedure.

## 2017-07-16 NOTE — ED Notes (Signed)
Patient called out for cramping abdominal pain. EDP notified.

## 2017-07-16 NOTE — ED Notes (Signed)
Patient had an incident of vomiting in the room. She vomited approximately half the bottle of contrast that she did drank. Patient was given medication for nausea. Patient states she is unable to drink the rest of the contrast.

## 2017-07-16 NOTE — ED Provider Notes (Signed)
[facility] DEPT Provider Note   CSN: [id number] Arrival date & time: 07/16/17  0540     History   Chief Complaint Chief Complaint  Patient presents with  . Abdominal Pain    HPI Megan [name] is a 66 y.o. female.  Patient presenting with a 6 week complaint of lower abdominal pain currently more so on the right side and hematuria. Patient currently on Macrodantin. Patient seen recently by urology in [place]. Patient at the end of July had CT renal study without evidence of any stones. There was some question raised about abnormality of the right ureter. Patient's not getting any better. No change on the Macrodantin. Patient had worse pain around 3:30 this morning. Patient without  vomiting or diarrhea. Patient is having to go P frequently. Experience abdominal cramping and also has had chills. But no documented fever or feeling of fever. Patient's primary care doctor is Dr. Legrand Rams.      Past Medical History:  Diagnosis Date  . High cholesterol   . Hypertension   . Kidney stone   . Leukopenia 05/04/2016  . Seasonal allergies     Patient Active Problem List   Diagnosis Date Noted  . Hypertension 05/19/2017  . DDD (degenerative disc disease), lumbar 05/19/2017  . Hyperlipidemia 05/19/2017  . Borderline diabetes 05/19/2017  . Iron deficiency 05/19/2017  . Hypothyroidism 05/19/2017  . Leukopenia 05/04/2016    Past Surgical History:  Procedure Laterality Date  . BILATERAL KNEE ARTHROSCOPY    . COLONOSCOPY N/A 07/30/2014   Procedure: COLONOSCOPY;  Surgeon: Danie Binder, MD;  Location: [facility];  Service: Endoscopy;  Laterality: N/A;  9:30 AM  . high cholesterol    . Right knee osteotomy      OB History    No data available       Home Medications    Prior to Admission medications   Medication Sig Start Date End Date Taking? Authorizing Provider  atorvastatin (LIPITOR) 10 MG tablet Take 1 tablet (10 mg total) by mouth daily. 06/18/17  Yes [place], Modena Nunnery, MD  Cholecalciferol (D3 HIGH POTENCY) 1000 units capsule Take 1 capsule (1,000 Units total) by mouth daily. 05/19/17  Yes [place], Modena Nunnery, MD  cyanocobalamin (V-R VITAMIN B-12) 500 MCG tablet Take 1 tablet (500 mcg total) by mouth daily. 05/19/17  Yes [place], Modena Nunnery, MD  lisinopril (PRINIVIL,ZESTRIL) 10 MG tablet Take 1 tablet (10 mg total) by mouth daily. 06/18/17  Yes [place], Modena Nunnery, MD  magnesium oxide (MAG-OX) 400 MG tablet Take 400 mg by mouth at bedtime.   Yes [provider]  naproxen (NAPROSYN) 500 MG tablet Take 1 tablet by mouth 2 (two) times daily. 06/28/17  Yes [provider]  nitrofurantoin, macrocrystal-monohydrate, (MACROBID) 100 MG capsule Take 1 capsule by mouth 2 (two) times daily. 07/13/17  Yes [provider]  vitamin C (ASCORBIC ACID) 500 MG tablet Take 1 tablet (500 mg total) by mouth daily. 05/19/17  Yes [place], Modena Nunnery, MD  cephALEXin (KEFLEX) 500 MG capsule Take 1 capsule (500 mg total) by mouth 4 (four) times daily. 07/16/17   Fredia Sorrow, MD  HYDROmorphone (DILAUDID) 4 MG tablet Take 1 tablet (4 mg total) by mouth every 6 (six) hours as needed for severe pain. 07/16/17   Fredia Sorrow, MD  ondansetron (ZOFRAN ODT) 4 MG disintegrating tablet Take 1 tablet (4 mg total) by mouth every 8 (eight) hours as needed. 07/16/17   Fredia Sorrow, MD  traMADol (ULTRAM) 50 MG  tablet Take 1-2 tablets every 8 hours as needed for pain Patient not taking: Reported on 06/25/2017 06/18/17   Alycia Rossetti, MD    Family History Family History  Problem Relation Age of Onset  . Hypertension Mother   . Asthma Mother   . Hypertension Brother   . Other Father        Rare blood disease  . Colon cancer Paternal Uncle   . Bladder Cancer Neg Hx   . Kidney cancer Neg Hx     Social History Social History  Substance Use Topics  . Smoking status: Never Smoker  . Smokeless tobacco: Never Used  . Alcohol use No     Allergies   Other and  Hydrocodone   Review of Systems Review of Systems  Constitutional: Positive for chills. Negative for fever.  HENT: Negative for congestion.   Eyes: Negative for visual disturbance.  Respiratory: Negative for shortness of breath.   Cardiovascular: Negative for chest pain.  Gastrointestinal: Positive for abdominal pain and nausea. Negative for diarrhea and vomiting.  Genitourinary: Positive for frequency and hematuria.  Musculoskeletal: Negative for back pain.  Skin: Negative for rash.  Neurological: Negative for headaches.  Hematological: Does not bruise/bleed easily.  Psychiatric/Behavioral: Negative for confusion.     Physical Exam Updated Vital Signs BP (!) 142/98   Pulse 62   Temp 97.7 F (36.5 C) (Oral)   Resp 18   Ht 1.664 m (5' 5.5")   Wt 74.4 kg (164 lb)   SpO2 100%   BMI 26.88 kg/m   Physical Exam  Constitutional: She is oriented to person, place, and time. She appears well-developed and well-nourished.  HENT:  Head: Normocephalic and atraumatic.  Mouth/Throat: Oropharynx is clear and moist.  Eyes: Pupils are equal, round, and reactive to light. EOM are normal.  Neck: Normal range of motion. Neck supple.  Cardiovascular: Normal rate.   Pulmonary/Chest: Effort normal and breath sounds normal.  Abdominal: Soft. Bowel sounds are normal. There is no tenderness.  Musculoskeletal: Normal range of motion. She exhibits no edema.  Neurological: She is alert and oriented to person, place, and time.  Skin: Skin is warm. No rash noted.  Nursing note and vitals reviewed.    ED Treatments / Results  Labs (all labs ordered are listed, but only abnormal results are displayed) Labs Reviewed  COMPREHENSIVE METABOLIC PANEL - Abnormal; Notable for the following:       Result Value   Glucose, Bld 132 (*)    All other components within normal limits  URINALYSIS, ROUTINE W REFLEX MICROSCOPIC - Abnormal; Notable for the following:    APPearance CLOUDY (*)    Hgb urine  dipstick LARGE (*)    Protein, ur 30 (*)    Leukocytes, UA TRACE (*)    All other components within normal limits  URINALYSIS, MICROSCOPIC (REFLEX) - Abnormal; Notable for the following:    Bacteria, UA FEW (*)    All other components within normal limits  URINE CULTURE  LIPASE, BLOOD  CBC    EKG  EKG Interpretation  Date/Time:  Friday July 16 2017 06:06:59 EDT Ventricular Rate:  64 PR Interval:    QRS Duration: 94 QT Interval:  389 QTC Calculation: 402 R Axis:   41 Text Interpretation:  Sinus rhythm Prolonged PR interval Low voltage, precordial leads No previous ECGs available Confirmed by Ripley Fraise [phone]) on 07/16/2017 6:27:05 AM       Radiology Ct Abdomen Pelvis W Contrast  Result Date: 07/16/2017  CLINICAL DATA:  Low abdominal pain common nausea. Vomiting contrast. EXAM: CT ABDOMEN AND PELVIS WITH CONTRAST TECHNIQUE: Multidetector CT imaging of the abdomen and pelvis was performed using the standard protocol following bolus administration of intravenous contrast. CONTRAST:  172mL ISOVUE-300 IOPAMIDOL (ISOVUE-300) INJECTION 61%, 70mL ISOVUE-300 IOPAMIDOL (ISOVUE-300) INJECTION 61% COMPARISON:  CT 06/18/2017, 12/26/2003 FINDINGS: Lower chest: Lung bases are clear. Hepatobiliary: No focal hepatic lesion. No biliary duct dilatation. Gallbladder is normal. Common bile duct is normal. Pancreas: Pancreas is normal. No ductal dilatation. No pancreatic inflammation. Spleen: Normal spleen Adrenals/urinary tract: Adrenal glands normal. There are large cysts within the cortex of the RIGHT kidney. The cysts are similar to comparison exams and enlarged over time. Largest cyst measures 8 cm in the RIGHT renal hilum. Second large cyst measures 5 cm exophytic from the lower pole. The RIGHT renal pelvis demonstrates enhancement of the uroepithelial (image 42, series 2). This enhancement extends into the ureter (image 48, series 2). The ureter is mildly dilated to 6 mm (image 51, series 2).  This ureteral dilatation and enhancement extends to the RIGHT vesicoureteral junction without obstructing lesion identified. The bladder appears normal. The LEFT kidney and collecting system appear normal. There is no enhancing renal lesion on the RIGHT or LEFT. Delayed imaging demonstrates no renal obstruction. There is mild pelvicaliectasis on the RIGHT. Stomach/Bowel: Stomach, small bowel, appendix, and cecum are normal. The colon and rectosigmoid colon are normal. Vascular/Lymphatic: Abdominal aorta is normal caliber. There is no retroperitoneal or periportal lymphadenopathy. No pelvic lymphadenopathy. Reproductive: Uterus and ovaries are normal. Other: No free fluid. Musculoskeletal: No aggressive osseous lesion. IMPRESSION: 1. Enhancement and mild dilatation of the RIGHT renal collecting system and ureter from the RIGHT renal pelvis to the RIGHT vesicoureteral junction. Findings suggest pyoureter. No clear evidence of pyelonephritis. No obstructing lesion identified in the course of the RIGHT ureter. 2. Large RIGHT renal cyst are nonenhancing and slowly enlarging over time consistent benign renal cysts. 3. LEFT kidney is normal. 4. No bladder abnormality. Electronically Signed   By: Suzy Bouchard M.D.   On: 07/16/2017 12:57    Procedures Procedures (including critical care time)  Medications Ordered in ED Medications  0.9 %  sodium chloride infusion ( Intravenous Stopped 07/16/17 1250)  HYDROmorphone (DILAUDID) injection 1 mg (not administered)  ondansetron (ZOFRAN) injection 4 mg (4 mg Intravenous Given 07/16/17 0659)  sodium chloride 0.9 % bolus 500 mL (0 mLs Intravenous Stopped 07/16/17 0901)  fentaNYL (SUBLIMAZE) injection 25 mcg (25 mcg Intravenous Given 07/16/17 0818)  fentaNYL (SUBLIMAZE) injection 25 mcg (25 mcg Intravenous Given 07/16/17 0904)  iopamidol (ISOVUE-300) 61 % injection 30 mL (30 mLs Oral Contrast Given 07/16/17 1217)  iopamidol (ISOVUE-300) 61 % injection 100 mL (100 mLs  Intravenous Contrast Given 07/16/17 1217)  ondansetron (ZOFRAN) injection 4 mg (4 mg Intravenous Given 07/16/17 1056)     Initial Impression / Assessment and Plan / ED Course  I have reviewed the triage vital signs and the nursing notes.  Pertinent labs & imaging results that were available during my care of the patient were reviewed by me and considered in my medical decision making (see chart for details).    Workup here today shows persistent hematuria without any significant white blood cells. Or bacteria. Culture sent. CT scan repeated this time with oral contrast and IV contrast. Still shows a abnormal appearing right ureter. They could be consistent with infection in the ureter however patient does not have a significant leukocytosis. Does not have fevers here.  Does not appear toxic. But her pain is predominantly on the right side to the could be a correlation. Patient will stop the Macrodantin will start Keflex and I will follow back up with urology for further evaluation of the persistent hematuria and the abnormal right ureter. Patient will return for any new or worse symptoms. Patient's symptoms improved here with pain medicine and antinausea medicine.  Although not as specifically identified problem other than the right ureter. Patient's symptoms have been present for a long period of time. Suspect that the right ureter has something to do with it. Have not completely ruled out bladder pathology although it looked normal on the CT.  Final Clinical Impressions(s) / ED Diagnoses   Final diagnoses:  Right lower quadrant abdominal pain  Gross hematuria    New Prescriptions New Prescriptions   CEPHALEXIN (KEFLEX) 500 MG CAPSULE    Take 1 capsule (500 mg total) by mouth 4 (four) times daily.   HYDROMORPHONE (DILAUDID) 4 MG TABLET    Take 1 tablet (4 mg total) by mouth every 6 (six) hours as needed for severe pain.   ONDANSETRON (ZOFRAN ODT) 4 MG DISINTEGRATING TABLET    Take 1 tablet (4  mg total) by mouth every 8 (eight) hours as needed.     Fredia Sorrow, MD 07/16/17 [phone]

## 2017-07-16 NOTE — Discharge Instructions (Signed)
Take the antibiotic Keflex as directed for the next 7 days. Call urology in [place] for follow-up we need additional opinion on the persistent hematuria and the abnormalities of the right ureter. Take the hydromorphone pain medication as needed. Take the Zofran as needed for nausea and vomiting.

## 2017-07-17 LAB — CULTURE, URINE COMPREHENSIVE

## 2017-07-18 LAB — URINE CULTURE

## 2017-07-19 ENCOUNTER — Other Ambulatory Visit (HOSPITAL_COMMUNITY): Payer: Medicare Other

## 2017-07-19 ENCOUNTER — Encounter (HOSPITAL_COMMUNITY): Payer: Self-pay

## 2017-07-19 ENCOUNTER — Telehealth: Payer: Self-pay | Admitting: Family Medicine

## 2017-07-19 NOTE — Telephone Encounter (Signed)
Patient notified, appointment scheduled

## 2017-07-19 NOTE — Telephone Encounter (Signed)
-----   Message from Nori Riis, PA-C sent at 07/18/2017  9:03 PM EDT ----- Please let Megan [name] know that her urine culture in our office was negative.  She needs to come in for an appointment as she has been seen in the ED with gross hematuria and flank pain.

## 2017-07-20 ENCOUNTER — Ambulatory Visit (INDEPENDENT_AMBULATORY_CARE_PROVIDER_SITE_OTHER): Payer: Medicare Other | Admitting: Urology

## 2017-07-20 ENCOUNTER — Encounter: Payer: Self-pay | Admitting: Urology

## 2017-07-20 VITALS — BP 102/65 | HR 65 | Ht 65.5 in | Wt 166.2 lb

## 2017-07-20 DIAGNOSIS — Z5321 Procedure and treatment not carried out due to patient leaving prior to being seen by health care provider: Secondary | ICD-10-CM

## 2017-07-21 ENCOUNTER — Encounter: Payer: Self-pay | Admitting: Urology

## 2017-08-18 ENCOUNTER — Encounter: Payer: Self-pay | Admitting: Family Medicine

## 2017-08-18 ENCOUNTER — Ambulatory Visit (INDEPENDENT_AMBULATORY_CARE_PROVIDER_SITE_OTHER): Payer: Medicare Other | Admitting: Family Medicine

## 2017-08-18 VITALS — BP 128/78 | HR 60 | Temp 97.9°F | Resp 16 | Ht 65.5 in | Wt 166.0 lb

## 2017-08-18 DIAGNOSIS — E7800 Pure hypercholesterolemia, unspecified: Secondary | ICD-10-CM | POA: Diagnosis not present

## 2017-08-18 DIAGNOSIS — R3129 Other microscopic hematuria: Secondary | ICD-10-CM | POA: Diagnosis not present

## 2017-08-18 DIAGNOSIS — I10 Essential (primary) hypertension: Secondary | ICD-10-CM

## 2017-08-18 LAB — URINALYSIS, ROUTINE W REFLEX MICROSCOPIC
BILIRUBIN URINE: NEGATIVE
Glucose, UA: NEGATIVE
Ketones, ur: NEGATIVE
NITRITE: POSITIVE — AB
Specific Gravity, Urine: 1.02 (ref 1.001–1.03)
pH: 6 (ref 5.0–8.0)

## 2017-08-18 LAB — MICROSCOPIC MESSAGE

## 2017-08-18 MED ORDER — ATORVASTATIN CALCIUM 10 MG PO TABS
10.0000 mg | ORAL_TABLET | Freq: Every day | ORAL | 2 refills | Status: DC
Start: 2017-08-18 — End: 2017-11-11

## 2017-08-18 MED ORDER — CEPHALEXIN 500 MG PO CAPS: 500.0000 mg | ORAL_CAPSULE | Freq: Four times a day (QID) | ORAL | 0 refills | Status: DC

## 2017-08-18 NOTE — Assessment & Plan Note (Signed)
Controlled discussed importance of the BP medication she is willing to continue taking

## 2017-08-18 NOTE — Assessment & Plan Note (Signed)
I dont think the lipitor caused the catch in her side Recommend that she resume., if she does have recurrent symptoms then we can discuss trial off the statin

## 2017-08-18 NOTE — Patient Instructions (Addendum)
I will call with lab results Restart your cholesterol medication F/U 3  months Fasting

## 2017-08-18 NOTE — Progress Notes (Signed)
   Subjective:    Patient ID: Megan [name], female    DOB: [birth year], 66 y.o.   MRN: [id number]  Patient presents for Back Pain (states that she is concerned that cholesterol medication is causing her pain) Pt here to discuss her medications. She thought her vitamins were causing her muscle pain, and catch that she had in her side back in July and august, she then stopped her lipitor thinking this caused it. In July was sent to urology as the CT scan showed large cystic lesion, thought to be benign and no intervention was needed. Approx 1 month ago, she had gross hematuria, she went to see Dr. Legrand Rams??, who gave her some pill that made her worse, she then went to ER, treated with Keflex for 7 days and repeat CT scan 8/17 showed Larger right cyst, with tiny surrounding cyst, also mild dilation of the ureters suggestive of pyoureter. URINE CULTURE from that date showed multiple species. She stats she improved after those antibiotics. She was worried some of her vitamins ormedicines were causing the catches in her side?? Denies any pain now, no UTI symptoms, no change in bowels, no N/V     Review Of Systems:  GEN- denies fatigue, fever, weight loss,weakness, recent illness HEENT- denies eye drainage, change in vision, nasal discharge, CVS- denies chest pain, palpitations RESP- denies SOB, cough, wheeze ABD- denies N/V, change in stools, abd pain GU- denies dysuria, hematuria, dribbling, incontinence MSK- denies joint pain, muscle aches, injury Neuro- denies headache, dizziness, syncope, seizure activity       Objective:    BP 128/78   Pulse 60   Temp 97.9 F (36.6 C) (Oral)   Resp 16   Ht 5' 5.5" (1.664 m)   Wt 166 lb (75.3 kg)   SpO2 98%   BMI 27.20 kg/m  GEN- NAD, alert and oriented x3 HEENT- PERRL, EOMI, non injected sclera, pink conjunctiva, MMM, oropharynx clear CVS- RRR, no murmur RESP-CTAB ABD-NABS,soft,NT,ND, no CVA tenderness EXT- No edema Pulses- Radial 2+         Assessment & Plan:      Problem List Items Addressed This Visit      Unprioritized   Hypertension    Controlled discussed importance of the BP medication she is willing to continue taking       Relevant Medications   atorvastatin (LIPITOR) 10 MG tablet   Hyperlipidemia    I dont think the lipitor caused the catch in her side Recommend that she resume., if she does have recurrent symptoms then we can discuss trial off the statin      Relevant Medications   atorvastatin (LIPITOR) 10 MG tablet    Other Visit Diagnoses    Microscopic hematuria    -  Primary   She is not symptomatic, but I will recheck ua/culture. Unable to get a species at last check at ER. Based on amount of WBC will start keflex again. Message left on pt voicemail.   Relevant Orders   Urinalysis, Routine w reflex microscopic (Completed)   Urine Culture      Note: This dictation was prepared with Dragon dictation along with smaller phrase technology. Any transcriptional errors that result from this process are unintentional.

## 2017-08-19 ENCOUNTER — Telehealth: Payer: Self-pay | Admitting: Family Medicine

## 2017-08-19 NOTE — Telephone Encounter (Signed)
Pt calling about her urine specimen results please call her back.

## 2017-08-19 NOTE — Telephone Encounter (Signed)
Please see labs for more information.

## 2017-08-20 ENCOUNTER — Other Ambulatory Visit: Payer: Self-pay | Admitting: *Deleted

## 2017-08-20 DIAGNOSIS — N390 Urinary tract infection, site not specified: Secondary | ICD-10-CM

## 2017-08-20 LAB — URINE CULTURE
MICRO NUMBER: 81036364
SPECIMEN QUALITY: ADEQUATE

## 2017-08-27 ENCOUNTER — Telehealth: Payer: Self-pay | Admitting: Family Medicine

## 2017-08-27 NOTE — Telephone Encounter (Signed)
Patient is calling to speak to you regarding her cholesterol medication, and an herb that her and dr Buelah Manis had discussed  [phone]

## 2017-08-27 NOTE — Telephone Encounter (Signed)
lvmtrc  

## 2017-08-30 NOTE — Telephone Encounter (Signed)
Pt called Megan [name] stating that she stopped taking the Lipitor on Friday and started the red yeast rice yesterday and wanted to make sure it was ok for her to be taking that with her other medications and when would you like to see her back after starting that does she need to come back and have labs/ov?

## 2017-08-30 NOTE — Telephone Encounter (Signed)
She was suppose to have a F/U For December for cholesterol

## 2017-08-30 NOTE — Telephone Encounter (Signed)
Left detailed message on VM with regards to F/U in December

## 2017-08-30 NOTE — Telephone Encounter (Signed)
Call placed to patient. LMTRC.  

## 2017-09-20 ENCOUNTER — Other Ambulatory Visit: Payer: Medicare Other

## 2017-09-20 DIAGNOSIS — N390 Urinary tract infection, site not specified: Secondary | ICD-10-CM

## 2017-09-20 LAB — URINALYSIS, ROUTINE W REFLEX MICROSCOPIC
BILIRUBIN URINE: NEGATIVE
Bacteria, UA: NONE SEEN /HPF
Glucose, UA: NEGATIVE
HGB URINE DIPSTICK: NEGATIVE
KETONES UR: NEGATIVE
Nitrite: NEGATIVE
Protein, ur: NEGATIVE
RBC / HPF: NONE SEEN /HPF (ref 0–2)
Specific Gravity, Urine: 1.02 (ref 1.001–1.03)
pH: 6 (ref 5.0–8.0)

## 2017-09-20 LAB — MICROSCOPIC MESSAGE

## 2017-09-22 LAB — URINE CULTURE
MICRO NUMBER: 81177963
SPECIMEN QUALITY: ADEQUATE

## 2017-11-08 ENCOUNTER — Ambulatory Visit: Payer: Medicare Other | Admitting: Family Medicine

## 2017-11-11 ENCOUNTER — Encounter: Payer: Self-pay | Admitting: Family Medicine

## 2017-11-11 ENCOUNTER — Ambulatory Visit (INDEPENDENT_AMBULATORY_CARE_PROVIDER_SITE_OTHER): Payer: Medicare Other | Admitting: Family Medicine

## 2017-11-11 ENCOUNTER — Other Ambulatory Visit: Payer: Self-pay

## 2017-11-11 VITALS — BP 128/74 | HR 72 | Temp 98.2°F | Resp 16 | Ht 65.5 in | Wt 170.0 lb

## 2017-11-11 DIAGNOSIS — R1084 Generalized abdominal pain: Secondary | ICD-10-CM

## 2017-11-11 DIAGNOSIS — I10 Essential (primary) hypertension: Secondary | ICD-10-CM

## 2017-11-11 DIAGNOSIS — R7303 Prediabetes: Secondary | ICD-10-CM | POA: Diagnosis not present

## 2017-11-11 DIAGNOSIS — E7800 Pure hypercholesterolemia, unspecified: Secondary | ICD-10-CM | POA: Diagnosis not present

## 2017-11-11 NOTE — Patient Instructions (Addendum)
We will check for H pylori Referral to GI  Labs to be done  She can take nexium sample once a day until results come in  F/U pending reusults

## 2017-11-11 NOTE — Assessment & Plan Note (Signed)
Taking red yeast rice will check her cholesterol on the supplement.

## 2017-11-11 NOTE — Assessment & Plan Note (Signed)
Discussed monitoring BP at home, taking medications regulary  States that she is afraid to take medications.

## 2017-11-11 NOTE — Progress Notes (Signed)
Subjective:    Patient ID: Megan [name], female    DOB: March 05, 1951, 66 y.o.   MRN: [id number]  Patient presents for Medication Review/ Refill (is fasting) and Abd Pain (reports that she has some stomach burning after foods- thinks she may have ulcer)   Pt here to discus medications.  Hyperlipidemia- she stopped her statin drug  But she is taking red yeast rice   ABdominal pain- gets pain and griping all around her stomach for months  (Since she went to ER in August). Sometimes has pain in rectal area. Gets some pain with eating. Has been trying different foods, not sure which is causing the pain.  Bowel movements are normal. No dysuria   Has been gaining weight despite the abdominal pain. Weight up 4lbs   BORDERLINE DM- last a1c 5.8% 6 MONTHS AGO  HTN- takes lisinopril on and off , she did take this morning      Review Of Systems:  GEN- denies fatigue, fever, weight loss,weakness, recent illness HEENT- denies eye drainage, change in vision, nasal discharge, CVS- denies chest pain, palpitations RESP- denies SOB, cough, wheeze ABD- denies N/V, change in stools, abd pain GU- denies dysuria, hematuria, dribbling, incontinence MSK- denies joint pain, muscle aches, injury Neuro- denies headache, dizziness, syncope, seizure activity       Objective:    BP 128/74   Pulse 72   Temp 98.2 F (36.8 C) (Oral)   Resp 16   Ht 5' 5.5" (1.664 m)   Wt 170 lb (77.1 kg)   SpO2 97%   BMI 27.86 kg/m  GEN- NAD, alert and oriented x3 HEENT- PERRL, EOMI, non injected sclera, pink conjunctiva, MMM, oropharynx clear Neck- Supple, no LAD CVS- RRR, no murmur RESP-CTAB ABD-NABS,soft, mild TTP RUQ/LUQ, no rebound, no guarding, no CVA tenderness  EXT- No edema Pulses- Radial,  2+        Assessment & Plan:      Problem List Items Addressed This Visit      Unprioritized   Borderline diabetes   Relevant Orders   Hemoglobin A1c   Hypertension - Primary    Discussed monitoring BP  at home, taking medications regulary  States that she is afraid to take medications.      Relevant Orders   CBC with Differential/Platelet   Comprehensive metabolic panel   Hyperlipidemia    Taking red yeast rice will check her cholesterol on the supplement.      Relevant Orders   Lipid panel    Other Visit Diagnoses    Generalized abdominal pain       Concern for generalized abd pain, intermittant with eating. No change in bowels, CT scan did not show any bowel pathology, no gallstones. ? ulcer related, has pain with eating.  Possible gallbladder etiology though this is been going on for quite some time no stones or sludge noted on her CT scan.  She does not take a lot of NSAIDs.  She does admit to some stress with her husband whom she states he is not taking care of himself.  We will obtain H. pylori here in the office.  We will check her labs.  She is not losing weight.  She has not had any GYN issues either.  Refer her to  Gastroenterology.  Nexium samples given    Relevant Orders   H. pylori breath test      Note: This dictation was prepared with Dragon dictation along with smaller phrase technology. Any transcriptional  errors that result from this process are unintentional.

## 2017-11-12 ENCOUNTER — Encounter: Payer: Self-pay | Admitting: Gastroenterology

## 2017-11-12 LAB — CBC WITH DIFFERENTIAL/PLATELET
BASOS ABS: 10 {cells}/uL (ref 0–200)
BASOS PCT: 0.3 %
EOS ABS: 191 {cells}/uL (ref 15–500)
Eosinophils Relative: 5.8 %
HEMATOCRIT: 34.8 % — AB (ref 35.0–45.0)
HEMOGLOBIN: 12 g/dL (ref 11.7–15.5)
LYMPHS ABS: 1185 {cells}/uL (ref 850–3900)
MCH: 30 pg (ref 27.0–33.0)
MCHC: 34.5 g/dL (ref 32.0–36.0)
MCV: 87 fL (ref 80.0–100.0)
MONOS PCT: 9.1 %
MPV: 11.4 fL (ref 7.5–12.5)
NEUTROS ABS: 1614 {cells}/uL (ref 1500–7800)
Neutrophils Relative %: 48.9 %
Platelets: 226 10*3/uL (ref 140–400)
RBC: 4 10*6/uL (ref 3.80–5.10)
RDW: 12.4 % (ref 11.0–15.0)
Total Lymphocyte: 35.9 %
WBC mixed population: 300 cells/uL (ref 200–950)
WBC: 3.3 10*3/uL — ABNORMAL LOW (ref 3.8–10.8)

## 2017-11-12 LAB — COMPREHENSIVE METABOLIC PANEL
AG Ratio: 1.6 (calc) (ref 1.0–2.5)
ALBUMIN MSPROF: 4.1 g/dL (ref 3.6–5.1)
ALKALINE PHOSPHATASE (APISO): 71 U/L (ref 33–130)
ALT: 13 U/L (ref 6–29)
AST: 18 U/L (ref 10–35)
BILIRUBIN TOTAL: 0.4 mg/dL (ref 0.2–1.2)
BUN: 12 mg/dL (ref 7–25)
CALCIUM: 10.2 mg/dL (ref 8.6–10.4)
CHLORIDE: 104 mmol/L (ref 98–110)
CO2: 26 mmol/L (ref 20–32)
CREATININE: 0.69 mg/dL (ref 0.50–0.99)
GLOBULIN: 2.6 g/dL (ref 1.9–3.7)
Glucose, Bld: 92 mg/dL (ref 65–99)
POTASSIUM: 4.1 mmol/L (ref 3.5–5.3)
Sodium: 139 mmol/L (ref 135–146)
Total Protein: 6.7 g/dL (ref 6.1–8.1)

## 2017-11-12 LAB — HEMOGLOBIN A1C
HEMOGLOBIN A1C: 5.7 %{Hb} — AB (ref <5.7)
MEAN PLASMA GLUCOSE: 117 (calc)
eAG (mmol/L): 6.5 (calc)

## 2017-11-12 LAB — LIPID PANEL
CHOL/HDL RATIO: 2.4 (calc) (ref <5.0)
Cholesterol: 237 mg/dL — ABNORMAL HIGH (ref <200)
HDL: 98 mg/dL (ref >50)
LDL Cholesterol (Calc): 125 mg/dL (calc) — ABNORMAL HIGH
NON-HDL CHOLESTEROL (CALC): 139 mg/dL — AB (ref <130)
Triglycerides: 51 mg/dL (ref <150)

## 2017-11-12 LAB — H. PYLORI BREATH TEST: H. PYLORI BREATH TEST: NOT DETECTED

## 2017-12-02 DIAGNOSIS — H524 Presbyopia: Secondary | ICD-10-CM | POA: Diagnosis not present

## 2017-12-29 ENCOUNTER — Ambulatory Visit: Payer: Medicare Other | Admitting: Gastroenterology

## 2017-12-29 DIAGNOSIS — R140 Abdominal distension (gaseous): Secondary | ICD-10-CM | POA: Diagnosis not present

## 2017-12-29 DIAGNOSIS — R109 Unspecified abdominal pain: Secondary | ICD-10-CM | POA: Insufficient documentation

## 2017-12-29 MED ORDER — RIFAXIMIN 550 MG PO TABS: 550.0000 mg | ORAL_TABLET | Freq: Three times a day (TID) | ORAL | 0 refills | Status: DC

## 2017-12-29 NOTE — Progress Notes (Signed)
cc'ed to pcp °

## 2017-12-29 NOTE — Patient Instructions (Addendum)
DRINK WATER TO KEEP YOUR URINE LIGHT YELLOW.  Avoid ALL ARTIFICIAL SWEETENERS AND HIGH FRUCTOSE CORN SYRUP. STEVIA OR AGAVE NECTAR ARE OK TO USE.  AVOID DAIRY AND TRANSITION TO A PLANT BASED DIET-NO MEAT OR DAIRY for 6 MOS. AVOID ITEMS THAT CAUSE BLOATING & GAS. I RECOMMEND THE BOOK, "PREVENT AND REVERSE HEART DISEASE", CALDWELL ESSELSTYN JR., MD. PAGES 120-121 [place] THE DIET AND THE LAST HALF OF THE BOOK HAS QUICK AND EASY RECIPES FOR BREAKFAST, LUNCH, AND DINNER ARE AFTER P 127.   TO REDUCE AND HOPEFULLY ELIMINATE THE BUBBLY GUTS, TAKE XIFAXAN THREE TIMES A  DAY FOR 2 WEEKS. PLEASE CALL WITH QUESTIONS OR CONCERNS.  FOLLOW UP IN 3 MOS.    Lactose Free Diet Lactose is a carbohydrate that is found mainly in milk and milk products, as well as in foods with added milk or whey. Lactose must be digested by the enzyme in order to be used by the body. Lactose intolerance occurs when there is a shortage of lactase. When your body is not able to digest lactose, you may feel sick to your stomach (nausea), bloating, cramping, gas and diarrhea.  There are many dairy products that may be tolerated better than milk by some people:  The use of cultured dairy products such as yogurt, buttermilk, cottage cheese, and sweet acidophilus milk (Kefir) for lactase-deficient individuals is usually well tolerated. This is because the healthy bacteria help digest lactose.   Lactose-hydrolyzed milk (Lactaid) contains 40-90% less lactose than milk and may also be well tolerated.    SPECIAL NOTES  Lactose is a carbohydrates. The major food source is dairy products. Reading food labels is important. Many products contain lactose even when they are not made from milk. Look for the following words: whey, milk solids, dry milk solids, nonfat dry milk powder. Typical sources of lactose other than dairy products include breads, candies, cold cuts, prepared and processed foods, and commercial sauces and  gravies.   All foods must be prepared without milk, cream, or other dairy foods.   Soy milk and lactose-free supplements (LACTASE) may be used as an alternative to milk.   FOOD GROUP ALLOWED/RECOMMENDED AVOID/USE SPARINGLY  BREADS / STARCHES 4 servings or more* Breads and rolls made without milk. [place], [place], or [place] bread. Breads and rolls that contain milk. Prepared mixes such as muffins, biscuits, waffles, pancakes. Sweet rolls, donuts, [place] toast (if made with milk or lactose).  Crackers: Soda crackers, graham crackers. Any crackers prepared without lactose. Zwieback crackers, corn curls, or any that contain lactose.  Cereals: Cooked or dry cereals prepared without lactose (read labels). Cooked or dry cereals prepared with lactose (read labels). Total, Cocoa Krispies. Special K.  Potatoes / Pasta / Rice: Any prepared without milk or lactose. Popcorn. Instant potatoes, frozen [place] fries, scalloped or au gratin potatoes.  VEGETABLES 2 servings or more Fresh, frozen, and canned vegetables. Creamed or breaded vegetables. Vegetables in a cheese sauce or with lactose-containing margarines.  FRUIT 2 servings or more All fresh, canned, or frozen fruits that are not processed with lactose. Any canned or frozen fruits processed with lactose.  MEAT & SUBSTITUTES 2 servings or more (4 to 6 oz. total per day) Plain beef, chicken, fish, [place], lamb, veal, pork, or ham. Kosher prepared meat products. Strained or junior meats that do not contain milk. Eggs, soy meat substitutes, nuts. Scrambled eggs, omelets, and souffles that contain milk. Creamed or breaded meat, fish, or fowl. Sausage products such as  wieners, liver sausage, or cold cuts that contain milk solids. Cheese, cottage cheese, or cheese spreads.  MILK None. (See "BEVERAGES" for milk substitutes. See "DESSERTS" for ice cream and frozen desserts.) Milk (whole, 2%, skim, or chocolate). Evaporated, powdered, or condensed milk; malted  milk.  SOUPS & COMBINATION FOODS Bouillon, broth, vegetable soups, clear soups, consomms. Homemade soups made with allowed ingredients. Combination or prepared foods that do not contain milk or milk products (read labels). Cream soups, chowders, commercially prepared soups containing lactose. Macaroni and cheese, pizza. Combination or prepared foods that contain milk or milk products.  DESSERTS & SWEETS In moderation Water and fruit ices; gelatin; angel food cake. Homemade cookies, pies, or cakes made from allowed ingredients. Pudding (if made with water or a milk substitute). Lactose-free tofu desserts. Sugar, honey, corn syrup, jam, jelly; marmalade; molasses (beet sugar); Pure sugar candy; marshmallows. Ice cream, ice milk, sherbet, custard, pudding, frozen yogurt. Commercial cake and cookie mixes. Desserts that contain chocolate. Pie crust made with milk-containing margarine; reduced-calorie desserts made with a sugar substitute that contains lactose. Toffee, peppermint, butterscotch, chocolate, caramels.  FATS & OILS In moderation Butter (as tolerated; contains very small amounts of lactose). Margarines and dressings that do not contain milk, Vegetable oils, shortening, Miracle Whip, mayonnaise, nondairy cream & whipped toppings without lactose or milk solids added (examples: Coffee Rich, Carnation Coffeemate, Rich's Whipped Topping, PolyRich). Berniece Salines. Margarines and salad dressings containing milk; cream, cream cheese; peanut butter with added milk solids, sour cream, chip dips, made with sour cream.  BEVERAGES Carbonated drinks; tea; coffee and freeze-dried coffee; some instant coffees (check labels). Fruit drinks; fruit and vegetable juice; Rice or Soy milk. Ovaltine, hot chocolate. Some cocoas; some instant coffees; instant iced teas; powdered fruit drinks (read labels).   CONDIMENTS / MISCELLANEOUS Soy sauce, carob powder, olives, gravy made with water, baker's cocoa, pickles, pure seasonings  and spices, wine, pure monosodium glutamate, catsup, mustard. Some chewing gums, chocolate, some cocoas. Certain antibiotics and vitamin / mineral preparations. Spice blends if they contain milk products. MSG extender. Artificial sweeteners that contain lactose such as Equal (Nutra-Sweet) and Sweet 'n Low. Some nondairy creamers (read labels).   SAMPLE MENU*  Breakfast   Orange Juice.  Banana.   Bran flakes.   Nondairy Creamer.  [place] Bread (toasted).   Butter or milk-free margarine.   Coffee or tea.    Noon Meal   Chicken Breast.  Rice.   Green beans.   Butter or milk-free margarine.  Fresh melon.   Coffee or tea.    Evening Meal   Roast Beef.  Baked potato.   Butter or milk-free margarine.   Broccoli.   Lettuce salad with vinegar and oil dressing.  [employer]   Coffee or tea.

## 2017-12-29 NOTE — Progress Notes (Signed)
   Subjective:    Patient ID: Megan [name], female    DOB: 11/20/1951, 67 y.o.   MRN: [id number]  Alycia Rossetti, MD  HPI LAST SEEN [phone] COLONOSCOPY. WAS DIAGNOSED WITH SALMONELLA LAST OF OCT 2018. SINCE THEN CRAMPS IN RIGHT OR LEFT ABDOMEN. AND SOMETIMES PAIN SHOOTS INTO HER PRIVATES. NEVER HAPPENED UNTIL SHE HAD THIS INCIDENCE. YESTERDAY WAS A BAD DAY. MAY HAPPEN EVERY 2-3 WEEKS. DRINKS NIGHTY NIGHT AND STOOL SOFT IN THE AM. IF STOOL GETS HARD WILL USE GLYCERIN SUPP.  BMs: EVERY AM AND SOMETIMES 2-3 TIMES A DAY. IF COFFEE WILL HAVE CREAMER. EATS NUTS.  GAINED 14 LBS SINCE 2015. SOME FOODS SHE EATS CASE BUBBLING BACK UP. FEELS CONSTIPATION: ONCE EVERY 3 MOS IF SHE EATS CHEESES.   PT DENIES FEVER, CHILLS, HEMATOCHEZIA, nausea, vomiting, melena, diarrhea, CHEST PAIN, SHORTNESS OF BREATH,  CHANGE IN BOWEL IN HABITS, constipation, problems swallowing, OR heartburn or indigestion.  Past Medical History:  Diagnosis Date  . High cholesterol   . Hypertension   . Kidney stone   . Leukopenia 05/04/2016  . Seasonal allergies    Past Surgical History:  Procedure Laterality Date  . BILATERAL KNEE ARTHROSCOPY    . COLONOSCOPY N/A 07/30/2014     . high cholesterol    . Right knee osteotomy     Allergies  Allergen Reactions  . Other Other (See Comments)    Nuts - pt states sometimes makes her gout flare up  . Hydrocodone     Makes her feel bad  . Naproxen     Makes her feel bad    Current Outpatient Medications  Medication Sig Dispense Refill  . cyanocobalamin (V-R VITAMIN B-12) 500 MCG tablet Take 1 tablet (500 mcg total) by mouth daily.    . Ferrous Sulfate (IRON) 325 (65 Fe) MG TABS Take by mouth as needed.    .      . lisinopril (PRINIVIL,ZESTRIL) 10 MG tablet Take 1 tablet (10 mg total) by mouth daily.    . Magnesium 250 MG TABS Take by mouth as needed.    . Pyridoxine HCl (VITAMIN B-6 PO) Take by mouth every other day.    . Red Yeast Rice Extract (RED YEAST RICE PO) Take by  mouth daily.    .      .      . magnesium oxide (MAG-OX) 400 MG tablet Take 400 mg by mouth.      Review of Systems PER HPI OTHERWISE ALL SYSTEMS ARE NEGATIVE.    Objective:   Physical Exam  Constitutional: She is oriented to person, place, and time. She appears well-developed and well-nourished. No distress.  HENT:  Head: Normocephalic and atraumatic.  Mouth/Throat: Oropharynx is clear and moist. No oropharyngeal exudate.  Eyes: Pupils are equal, round, and reactive to light. No scleral icterus.  Neck: Normal range of motion. Neck supple.  Cardiovascular: Normal rate, regular rhythm and normal heart sounds.  Pulmonary/Chest: Effort normal and breath sounds normal. No respiratory distress.  Abdominal: Soft. Bowel sounds are normal. She exhibits no distension. There is tenderness. There is no rebound and no guarding.  MILD TTP x4  Musculoskeletal: She exhibits no edema.  Lymphadenopathy:    She has no cervical adenopathy.  Neurological: She is alert and oriented to person, place, and time.  NO FOCAL DEFICITS  Psychiatric:  SLIGHTLY ANXIOUS MOOD, normal AFFECT  Vitals reviewed.      Assessment & Plan:

## 2017-12-29 NOTE — Assessment & Plan Note (Signed)
AFTER RECOVERING FROM SALMONELLA. MAY BE ASSOCIATED WITH SOFT STOOLS. SYMPTOMS NOT IDEALLY CONTROLLED AND DUE TO POST-INFECTIOUS IBS V. SMALL INTESTINE BACTERIAL OVERGROWTH.  DRINK WATER TO KEEP YOUR URINE LIGHT YELLOW. Avoid ALL ARTIFICIAL SWEETENERS AND HIGH FRUCTOSE CORN SYRUP. STEVIA OR AGAVE NECTAR ARE OK TO USE. AVOID DAIRY.  HANDOUT GIVEN. TRANSITION TO A PLANT BASED DIET-NO MEAT OR DAIRY for 6 MOS. AVOID ITEMS THAT CAUSE BLOATING & GAS. I RECOMMEND THE BOOK, "PREVENT AND REVERSE HEART DISEASE", CALDWELL ESSELSTYN JR., MD. PAGES 120-121 [place] THE DIET AND THE LAST HALF OF THE BOOK HAS QUICK AND EASY RECIPES FOR BREAKFAST, LUNCH, AND DINNER ARE AFTER P 127.  TO REDUCE AND HOPEFULLY ELIMINATE GI UPSET, TAKE XIFAXAN THREE TIMES A  DAY FOR 2 WEEKS. PLEASE CALL WITH QUESTIONS OR CONCERNS.  FOLLOW UP IN 3 MOS.

## 2017-12-30 NOTE — Progress Notes (Signed)
ON RECALL  °

## 2018-01-03 ENCOUNTER — Telehealth: Payer: Self-pay

## 2018-01-03 NOTE — Telephone Encounter (Signed)
Working on [employer] for [employer].  Received an appeal form. Please explain why this medication is medically necessary for the patient.

## 2018-01-03 NOTE — Telephone Encounter (Signed)
PT NEEDS XIFAXAN FOR POST-INFECTIOUS IBS-D.

## 2018-01-03 NOTE — Telephone Encounter (Signed)
Sent info via cover my meds.

## 2018-01-10 NOTE — Telephone Encounter (Signed)
T/C from [place] today with a couple of questions for the appeal. We should be notified about the PA via fax.

## 2018-01-20 ENCOUNTER — Telehealth: Payer: Self-pay | Admitting: Family Medicine

## 2018-01-20 NOTE — Telephone Encounter (Signed)
[facility] [place]  Patient would like 3 month supply of her lisinopril

## 2018-01-20 NOTE — Telephone Encounter (Signed)
Received notice PA approved # [id number] for Xifaxan valid January 31st-February 26th 2019.

## 2018-01-20 NOTE — Telephone Encounter (Signed)
PT is aware and will check with pharmacist. Approval to be scanned in.

## 2018-01-21 MED ORDER — LISINOPRIL 10 MG PO TABS: 10.0000 mg | ORAL_TABLET | Freq: Every day | ORAL | 1 refills | Status: DC

## 2018-01-21 NOTE — Telephone Encounter (Signed)
Refill appropriate and filled per protocol. 

## 2018-01-25 NOTE — Telephone Encounter (Signed)
Pt is aware and I am mailing a low Fod[employer].

## 2018-01-25 NOTE — Telephone Encounter (Signed)
PLEASE CALL PT. She should avoid fast food. SHE SHOULD FOLLOW A LOWFODMAP DIET. SHE SHOULD TAKE IBGARD 2 PILLS DAILY.

## 2018-01-25 NOTE — Telephone Encounter (Signed)
PT was not able to get the Xifaxan even after the approval. It was still gong to cost her $675.00. She said she could get the money, but she just refuses to pay that much for medication. She is feeling some better than when she came into the office.  She ate bad last night, chili from [facility] and had a bad time from that. She knows better she said. She started a probiotic last week and it might be helping some.

## 2018-02-10 IMAGING — CT CT ABD-PELV W/ CM
2 of 5 series · 15 of 46 positions shown, 17 images · IV contrast (Isovue)
Comparison: CT 06/18/2017, 12/26/2003

CLINICAL DATA: Low abdominal pain common nausea. Vomiting contrast.

EXAM:
CT ABDOMEN AND PELVIS WITH CONTRAST
TECHNIQUE: Multidetector CT imaging of the abdomen and pelvis was performed
using the standard protocol following bolus administration of
intravenous contrast.
CONTRAST:  100mL 9R77UK-LLL IOPAMIDOL (9R77UK-LLL) INJECTION 61%,
30mL 9R77UK-LLL IOPAMIDOL (9R77UK-LLL) INJECTION 61%

[Series 2: axial st · axial · 0.70mm/px · z∈[+1084,+1504]mm · 12 of 96 slices shown, 14 images]
[im 6/96  soft-tissue]
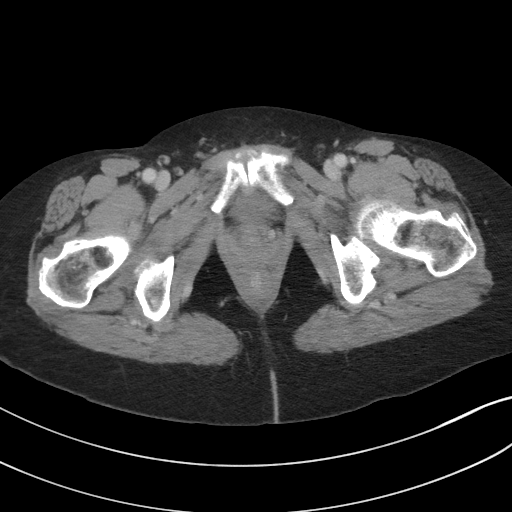
[im 6/96  bone]
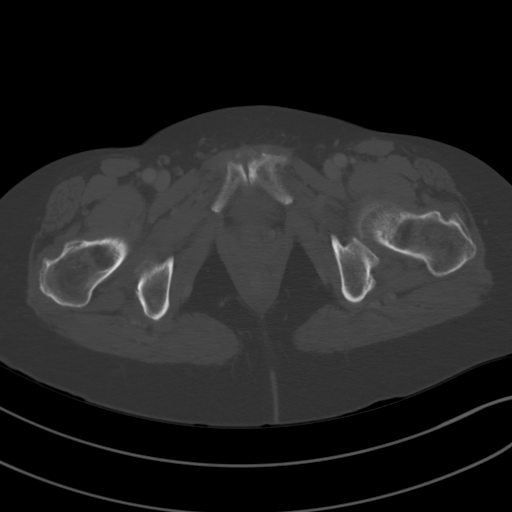
[im 12/96  soft-tissue]
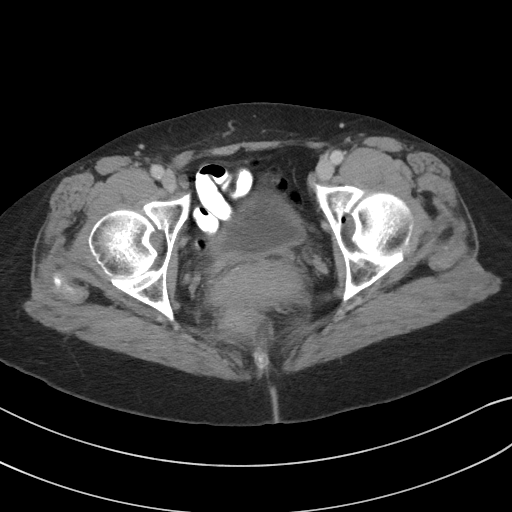
[im 24/96  soft-tissue]
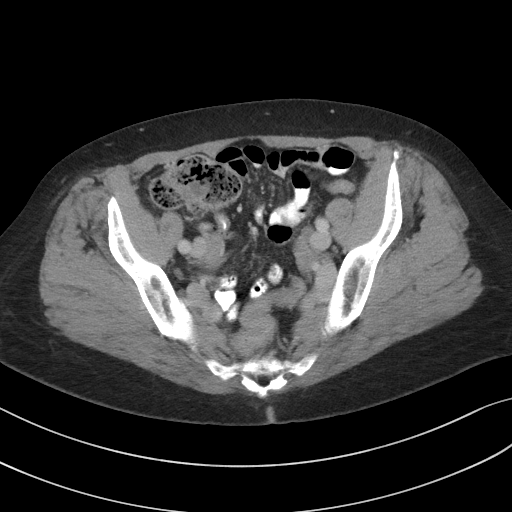
[im 30/96  soft-tissue]
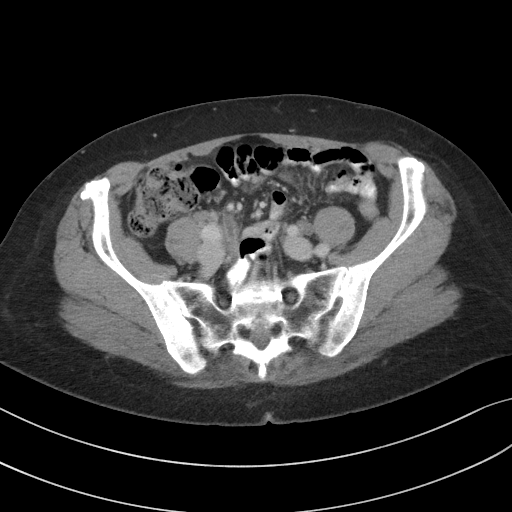
[im 36/96  soft-tissue]
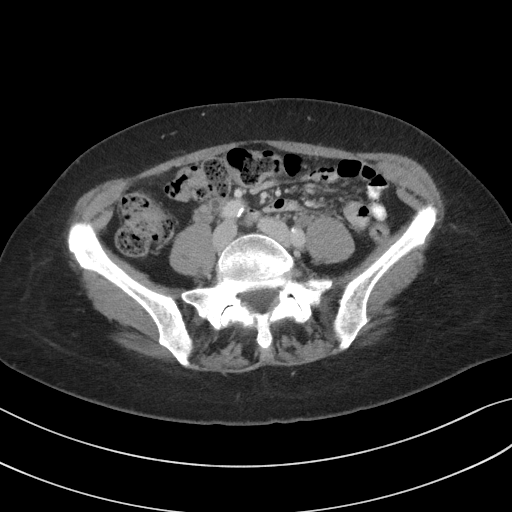
[im 42/96  soft-tissue]
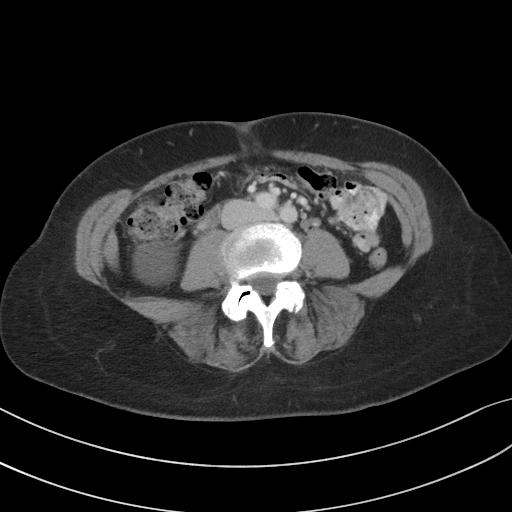
[im 54/96  soft-tissue]
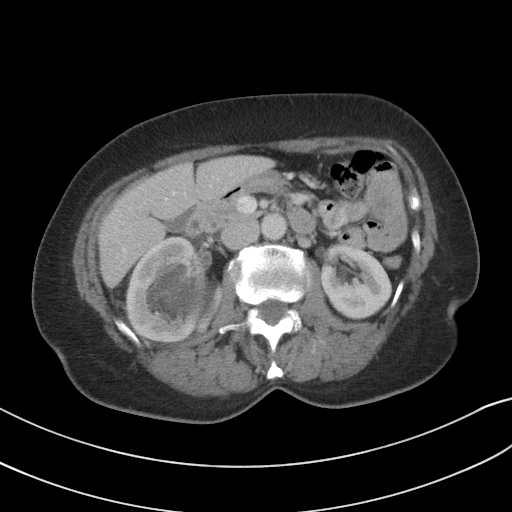
[im 60/96  soft-tissue]
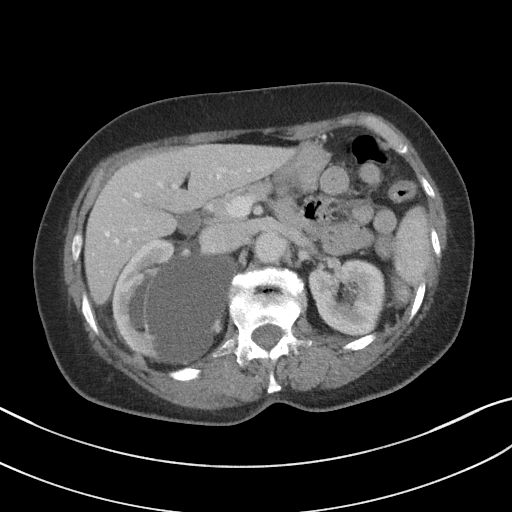
[im 66/96  soft-tissue]
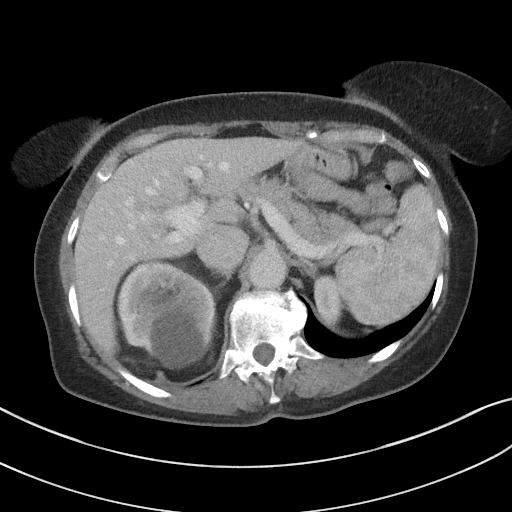
[im 66/96  bone]
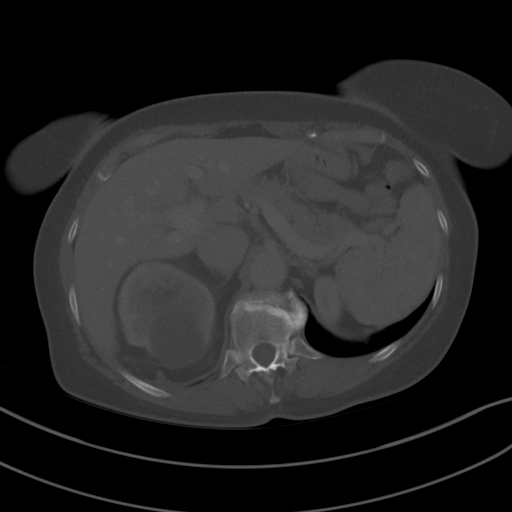
[im 72/96  soft-tissue]
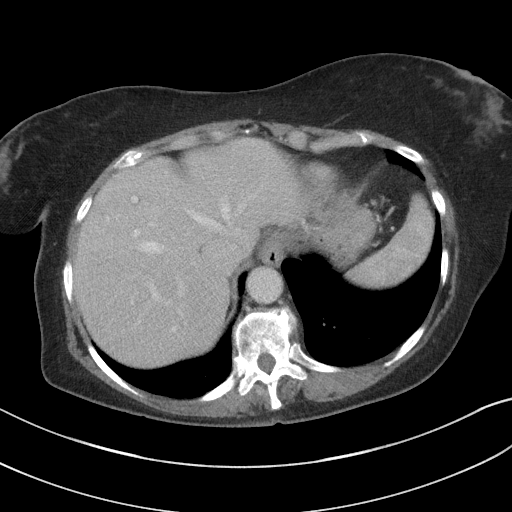
[im 84/96  soft-tissue]
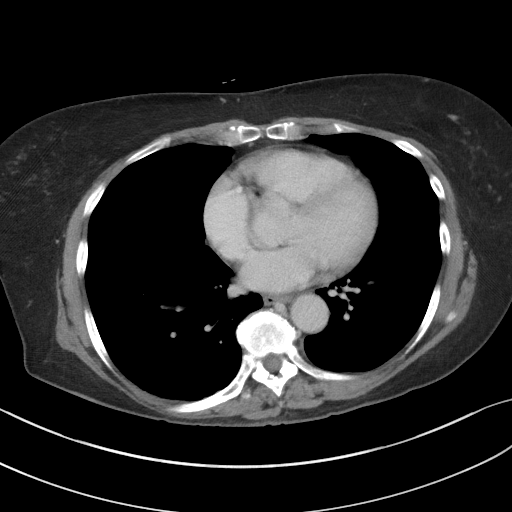
[im 90/96  soft-tissue]
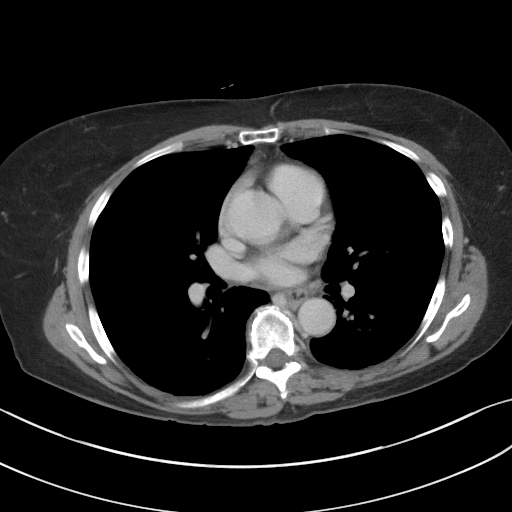

[Series 6: coronal st · coronal · 0.68mm/px · 3 of 101 slices shown]
[im 34/101  soft-tissue]
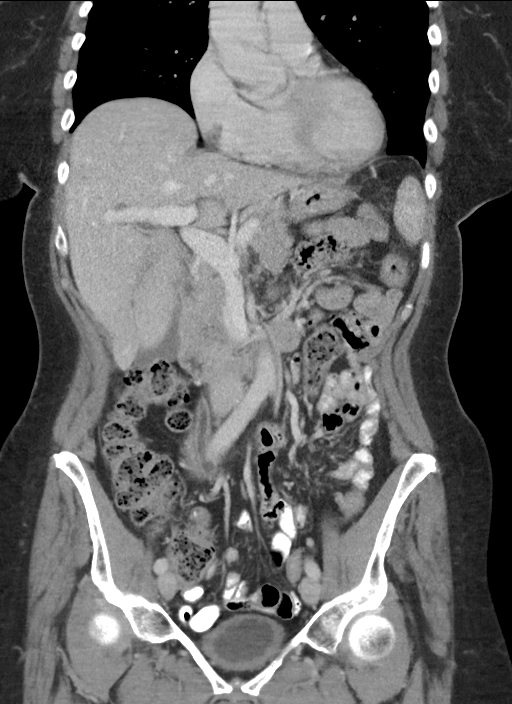
[im 45/101  soft-tissue]
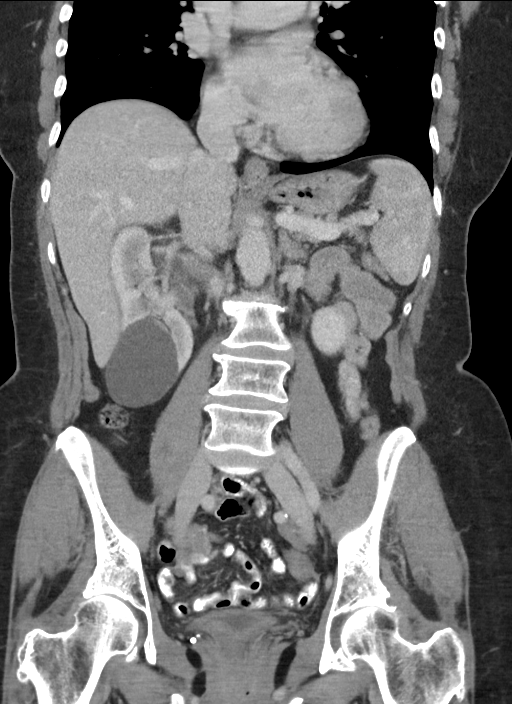
[im 56/101  soft-tissue]
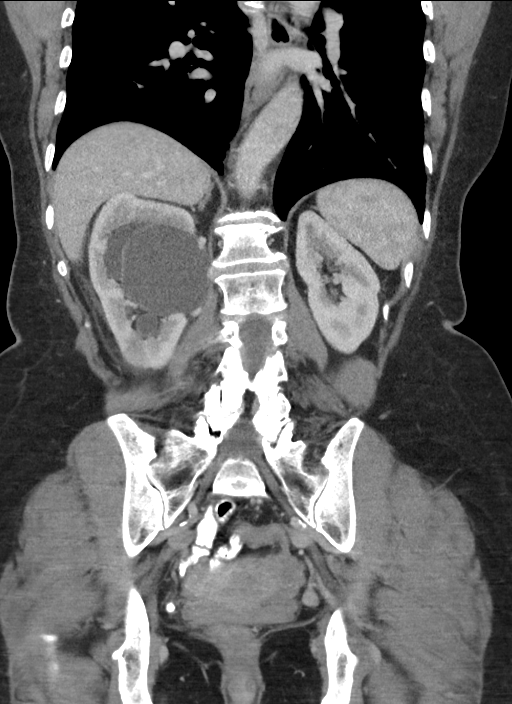

[15 of 46 positions shown; findings below may reference images not displayed]

FINDINGS: Lower chest: Lung bases are clear.

Hepatobiliary: No focal hepatic lesion. No biliary duct dilatation.
Gallbladder is normal. Common bile duct is normal.

Pancreas: Pancreas is normal. No ductal dilatation. No pancreatic
inflammation.

Spleen: Normal spleen

Adrenals/urinary tract: Adrenal glands normal.

There are large cysts within the cortex of the RIGHT kidney. The
cysts are similar to comparison exams and enlarged over time.
Largest cyst measures 8 cm in the RIGHT renal hilum. Second large
cyst measures 5 cm exophytic from the lower pole.

The RIGHT renal pelvis demonstrates enhancement of the uroepithelial
(image 42, series 2). This enhancement extends into the ureter
(image 48, series 2). The ureter is mildly dilated to 6 mm (image
51, series 2). This ureteral dilatation and enhancement extends to
the RIGHT vesicoureteral junction without obstructing lesion
identified. The bladder appears normal.

The LEFT kidney and collecting system appear normal.

There is no enhancing renal lesion on the RIGHT or LEFT.

Delayed imaging demonstrates no renal obstruction. There is mild
pelvicaliectasis on the RIGHT.

Stomach/Bowel: Stomach, small bowel, appendix, and cecum are normal.
The colon and rectosigmoid colon are normal.

Vascular/Lymphatic: Abdominal aorta is normal caliber. There is no
retroperitoneal or periportal lymphadenopathy. No pelvic
lymphadenopathy.

Reproductive: Uterus and ovaries are normal.

Other: No free fluid.

Musculoskeletal: No aggressive osseous lesion.
IMPRESSION: 1. Enhancement and mild dilatation of the RIGHT renal collecting
system and ureter from the RIGHT renal pelvis to the RIGHT
vesicoureteral junction. Findings suggest pyoureter. No clear
evidence of pyelonephritis. No obstructing lesion identified in the
course of the RIGHT ureter.
2. Large RIGHT renal cyst are nonenhancing and slowly enlarging over
time consistent benign renal cysts.
3. LEFT kidney is normal.
4. No bladder abnormality.

## 2018-02-15 ENCOUNTER — Ambulatory Visit (INDEPENDENT_AMBULATORY_CARE_PROVIDER_SITE_OTHER): Payer: Medicare Other

## 2018-02-15 ENCOUNTER — Encounter: Payer: Self-pay | Admitting: Orthopedic Surgery

## 2018-02-15 ENCOUNTER — Ambulatory Visit: Payer: Medicare Other | Admitting: Orthopedic Surgery

## 2018-02-15 VITALS — BP 109/73 | HR 76 | Ht 65.0 in | Wt 168.0 lb

## 2018-02-15 DIAGNOSIS — G8929 Other chronic pain: Secondary | ICD-10-CM

## 2018-02-15 DIAGNOSIS — M25561 Pain in right knee: Secondary | ICD-10-CM

## 2018-02-15 DIAGNOSIS — M170 Bilateral primary osteoarthritis of knee: Secondary | ICD-10-CM | POA: Diagnosis not present

## 2018-02-15 DIAGNOSIS — M25562 Pain in left knee: Secondary | ICD-10-CM

## 2018-02-15 NOTE — Progress Notes (Signed)
Progress Note   Patient ID: Megan [name], female   DOB: [birth year], 67 y.o.   MRN: [id number]  Chief Complaint  Patient presents with  . Knee Pain    bilateral left worse than right     67 year old female had a right knee high tibial osteotomy at [facility] approximately 10-12 years ago complains of medial knee pain and also has osteoarthritis of the left knee for several years associated with decreased range of motion painful weightbearing with dull aching somewhat weather related and would like that evaluated     Review of Systems  Respiratory: Negative.   Cardiovascular: Negative.   Musculoskeletal: Positive for back pain.   Current Meds  Medication Sig  . cyanocobalamin (V-R VITAMIN B-12) 500 MCG tablet Take 1 tablet (500 mcg total) by mouth daily.  . Ferrous Sulfate (IRON) 325 (65 Fe) MG TABS Take by mouth as needed.  Marland Kitchen lisinopril (PRINIVIL,ZESTRIL) 10 MG tablet Take 1 tablet (10 mg total) by mouth daily.  . Magnesium 250 MG TABS Take by mouth as needed.  . magnesium oxide (MAG-OX) 400 MG tablet Take 400 mg by mouth.   . Pyridoxine HCl (VITAMIN B-6 PO) Take by mouth every other day.  . Red Yeast Rice Extract (RED YEAST RICE PO) Take by mouth daily.  . vitamin C (ASCORBIC ACID) 500 MG tablet Take 1 tablet (500 mg total) by mouth daily. (Patient taking differently: Take 500 mg by mouth as needed. )    Past Medical History:  Diagnosis Date  . High cholesterol   . Hypertension   . Kidney stone   . Leukopenia 05/04/2016  . Seasonal allergies      Allergies  Allergen Reactions  . Other Other (See Comments)    Nuts - pt states sometimes makes her gout flare up  . Hydrocodone     Makes her feel bad  . Naproxen     Makes her feel bad     BP 109/73   Pulse 76   Ht 5\' 5"  (1.651 m)   Wt 168 lb (76.2 kg)   BMI 27.96 kg/m    Physical Exam  Constitutional: She is oriented to person, place, and time. She appears well-developed and well-nourished.  Neurological: She is alert  and oriented to person, place, and time.  Psychiatric: She has a normal mood and affect. Judgment normal.  Vitals reviewed.   Right Knee Exam   Muscle Strength  The patient has normal right knee strength.  Tenderness  The patient is experiencing tenderness in the medial joint line.   Left Knee Exam   Muscle Strength  The patient has normal left knee strength.  Tenderness  The patient is experiencing tenderness in the medial joint line.     Range of motion right knee 125 left knee 125 no contracture cruciate ligaments and both knees are normal Right knee medial incision from high tibial osteotomy left knee skin normal sensation and pulse normal on the right as well as the left no swelling or joint effusion in either knee   Medical decision-making  Imaging: See dictated report bilateral knee films Both knees have osteoarthritis medial compartment of the right knee has a opening wedge plate and screw construct from a high tibial osteotomy  Encounter Diagnoses  Name Primary?  . Chronic pain of both knees Yes  . Primary osteoarthritis of both knees     Procedure note left knee injection verbal consent was obtained to inject left knee joint  Timeout was completed  to confirm the site of injection  The medications used were 40 mg of Depo-Medrol and 1% lidocaine 3 cc  Anesthesia was provided by ethyl chloride and the skin was prepped with alcohol.  After cleaning the skin with alcohol a 20-gauge needle was used to inject the left knee joint. There were no complications. A sterile bandage was applied.  Return in 1 year     Arther Abbott, MD 02/15/2018 3:03 PM

## 2018-02-23 ENCOUNTER — Encounter: Payer: Self-pay | Admitting: Gastroenterology

## 2018-02-28 ENCOUNTER — Ambulatory Visit (INDEPENDENT_AMBULATORY_CARE_PROVIDER_SITE_OTHER): Payer: Medicare Other | Admitting: Family Medicine

## 2018-02-28 ENCOUNTER — Other Ambulatory Visit: Payer: Self-pay

## 2018-02-28 ENCOUNTER — Encounter: Payer: Self-pay | Admitting: Family Medicine

## 2018-02-28 VITALS — BP 116/72 | HR 80 | Temp 98.5°F | Resp 12 | Ht 65.0 in | Wt 169.0 lb

## 2018-02-28 DIAGNOSIS — I10 Essential (primary) hypertension: Secondary | ICD-10-CM | POA: Diagnosis not present

## 2018-02-28 DIAGNOSIS — M5136 Other intervertebral disc degeneration, lumbar region: Secondary | ICD-10-CM | POA: Diagnosis not present

## 2018-02-28 DIAGNOSIS — E039 Hypothyroidism, unspecified: Secondary | ICD-10-CM | POA: Diagnosis not present

## 2018-02-28 DIAGNOSIS — M7989 Other specified soft tissue disorders: Secondary | ICD-10-CM | POA: Diagnosis not present

## 2018-02-28 DIAGNOSIS — E7800 Pure hypercholesterolemia, unspecified: Secondary | ICD-10-CM | POA: Diagnosis not present

## 2018-02-28 NOTE — Progress Notes (Signed)
Subjective:    Patient ID: Megan [name], female    DOB: [birth year], 67 y.o.   MRN: [id number]  Patient presents for Follow-up (is fasting) and Side Pain (x5 weeks- states that she has catch in B sides, but noted now mainly in L side)   Pt here to f/u chronic medical problems HTN- taking lisinopril as prescribed, no side effects  Hyperlipidemia - trying to control with diet, on red yeast rice , last LDL 125 in Dec    Hypothyroidism- no current meds, due for repeat TFT    She has OA both knees-had injection in left knee, told she needs bilat knee replacement, has new tumeric supplement she wants to try     Has left side/flank pain muscular , has known DDD in spine that she gets catches sometimes when she is lifting her heavy buckets to water her plants when she picks up her grandson.  She tries to do a lot at one time off to wash both of her car and later on will have to catch in her side.    She uses pain patches OTC, uses topical, has used tens unit, these have helped    She had an episode where her foot swelled up she thought was gout she took some cheery tart teeth it went down after an hour she has had before  Review Of Systems:  GEN- denies fatigue, fever, weight loss,weakness, recent illness HEENT- denies eye drainage, change in vision, nasal discharge, CVS- denies chest pain, palpitations RESP- denies SOB, cough, wheeze ABD- denies N/V, change in stools, abd pain GU- denies dysuria, hematuria, dribbling, incontinence MSK- + joint pain, +muscle aches, injury Neuro- denies headache, dizziness, syncope, seizure activity       Objective:    BP 116/72   Pulse 80   Temp 98.5 F (36.9 C) (Oral)   Resp 12   Ht 5\' 5"  (1.651 m)   Wt 169 lb (76.7 kg)   SpO2 98%   BMI 28.12 kg/m  GEN- NAD, alert and oriented x3 HEENT- PERRL, EOMI, non injected sclera, pink conjunctiva, MMM, oropharynx clear Neck- Supple, no thyromegaly CVS- RRR, no murmur RESP-CTAB ABD-NABS,soft,NT,ND,  no CVA tenderness MSK- Spine NT, Fair ROM, neg SLR,mild TTP left side EXT- No edema Pulses- Radial 2+        Assessment & Plan:      Problem List Items Addressed This Visit      Unprioritized   Hypothyroidism   Relevant Orders   TSH   T4, free   Hypertension - Primary    Blood pressure is controlled no change in medication check renal function      Relevant Orders   Comprehensive metabolic panel   CBC with Differential/Platelet   Hyperlipidemia    Check lipids on red yeast rice      Relevant Orders   Lipid panel   DDD (degenerative disc disease), lumbar    MSK pain, has arthritis, does how she is overdoing things which is causing her pain to flareup from time to time.  She does feel better when she does her exercises and stretches.  She can use the TENS unit and the topicals into the area of concern.  No red flags on exam no imaging needed       Other Visit Diagnoses    Foot swelling       Current swelling but will check uric acid   Relevant Orders   Uric Acid  Note: This dictation was prepared with Dragon dictation along with smaller phrase technology. Any transcriptional errors that result from this process are unintentional.

## 2018-02-28 NOTE — Assessment & Plan Note (Signed)
MSK pain, has arthritis, does how she is overdoing things which is causing her pain to flareup from time to time.  She does feel better when she does her exercises and stretches.  She can use the TENS unit and the topicals into the area of concern.  No red flags on exam no imaging needed

## 2018-02-28 NOTE — Patient Instructions (Signed)
F/U 6 months Physical  Continue current medication

## 2018-02-28 NOTE — Assessment & Plan Note (Addendum)
Check lipids on red yeast rice

## 2018-02-28 NOTE — Assessment & Plan Note (Signed)
Blood pressure is controlled no change in medication check renal function 

## 2018-03-01 LAB — COMPREHENSIVE METABOLIC PANEL
AG Ratio: 1.7 (calc) (ref 1.0–2.5)
ALBUMIN MSPROF: 4.2 g/dL (ref 3.6–5.1)
ALKALINE PHOSPHATASE (APISO): 74 U/L (ref 33–130)
ALT: 13 U/L (ref 6–29)
AST: 16 U/L (ref 10–35)
BUN: 12 mg/dL (ref 7–25)
CO2: 30 mmol/L (ref 20–32)
CREATININE: 0.77 mg/dL (ref 0.50–0.99)
Calcium: 10 mg/dL (ref 8.6–10.4)
Chloride: 105 mmol/L (ref 98–110)
Globulin: 2.5 g/dL (calc) (ref 1.9–3.7)
Glucose, Bld: 96 mg/dL (ref 65–99)
Potassium: 4.4 mmol/L (ref 3.5–5.3)
Sodium: 140 mmol/L (ref 135–146)
Total Bilirubin: 0.3 mg/dL (ref 0.2–1.2)
Total Protein: 6.7 g/dL (ref 6.1–8.1)

## 2018-03-01 LAB — CBC WITH DIFFERENTIAL/PLATELET
BASOS ABS: 20 {cells}/uL (ref 0–200)
BASOS PCT: 0.6 %
Eosinophils Absolute: 310 cells/uL (ref 15–500)
Eosinophils Relative: 9.4 %
HEMATOCRIT: 35.5 % (ref 35.0–45.0)
HEMOGLOBIN: 12.3 g/dL (ref 11.7–15.5)
LYMPHS ABS: 809 {cells}/uL — AB (ref 850–3900)
MCH: 30.2 pg (ref 27.0–33.0)
MCHC: 34.6 g/dL (ref 32.0–36.0)
MCV: 87.2 fL (ref 80.0–100.0)
MONOS PCT: 7.6 %
MPV: 11.8 fL (ref 7.5–12.5)
NEUTROS ABS: 1911 {cells}/uL (ref 1500–7800)
Neutrophils Relative %: 57.9 %
Platelets: 207 10*3/uL (ref 140–400)
RBC: 4.07 10*6/uL (ref 3.80–5.10)
RDW: 12.5 % (ref 11.0–15.0)
Total Lymphocyte: 24.5 %
WBC mixed population: 251 cells/uL (ref 200–950)
WBC: 3.3 10*3/uL — AB (ref 3.8–10.8)

## 2018-03-01 LAB — LIPID PANEL
CHOL/HDL RATIO: 2.6 (calc) (ref <5.0)
CHOLESTEROL: 219 mg/dL — AB (ref <200)
HDL: 84 mg/dL (ref >50)
LDL CHOLESTEROL (CALC): 120 mg/dL — AB
Non-HDL Cholesterol (Calc): 135 mg/dL (calc) — ABNORMAL HIGH (ref <130)
Triglycerides: 62 mg/dL (ref <150)

## 2018-03-01 LAB — TSH: TSH: 2.28 mIU/L (ref 0.40–4.50)

## 2018-03-01 LAB — URIC ACID: Uric Acid, Serum: 4.4 mg/dL (ref 2.5–7.0)

## 2018-03-01 LAB — T4, FREE: Free T4: 1.1 ng/dL (ref 0.8–1.8)

## 2018-03-03 ENCOUNTER — Encounter: Payer: Self-pay | Admitting: *Deleted

## 2018-04-04 ENCOUNTER — Telehealth: Payer: Self-pay | Admitting: Family Medicine

## 2018-04-04 DIAGNOSIS — M5136 Other intervertebral disc degeneration, lumbar region: Secondary | ICD-10-CM

## 2018-04-04 NOTE — Telephone Encounter (Signed)
She has DDD in lumbar spine, she can see an orthopedist for her spine She does NOT need a rheumatologist

## 2018-04-04 NOTE — Telephone Encounter (Signed)
Patient called in stating that she has been experiencing pain to her left side that feels like a "catch". States that she has been having to take ibuprofen everyday for the past few days as well as use her tens unit. Patient would like to know if we could refer her to a specialist such as a rheumatologist. Please advise?

## 2018-04-04 NOTE — Addendum Note (Signed)
Addended by: Ofilia Neas R on: 04/04/2018 12:35 PM   Modules accepted: Orders

## 2018-04-05 NOTE — Telephone Encounter (Signed)
Patient is scheduled for 05/02/18 at 2:20 pm.

## 2018-05-02 ENCOUNTER — Ambulatory Visit: Payer: Medicare Other | Admitting: Orthopedic Surgery

## 2018-05-09 ENCOUNTER — Ambulatory Visit: Payer: Medicare Other | Admitting: Orthopedic Surgery

## 2018-06-30 ENCOUNTER — Ambulatory Visit: Payer: Medicare Other | Admitting: Family Medicine

## 2018-07-06 ENCOUNTER — Other Ambulatory Visit: Payer: Self-pay | Admitting: *Deleted

## 2018-07-06 MED ORDER — LISINOPRIL 10 MG PO TABS: 10.0000 mg | ORAL_TABLET | Freq: Every day | ORAL | 1 refills | Status: DC

## 2018-07-11 ENCOUNTER — Encounter: Payer: Self-pay | Admitting: Family Medicine

## 2018-07-13 ENCOUNTER — Encounter: Payer: Self-pay | Admitting: Family Medicine

## 2018-07-13 ENCOUNTER — Ambulatory Visit (HOSPITAL_COMMUNITY)
Admission: RE | Admit: 2018-07-13 | Discharge: 2018-07-13 | Disposition: A | Discharge status: Home / Self Care | Payer: Medicare Other | Source: Ambulatory Visit | Attending: Family Medicine | Admitting: Family Medicine

## 2018-07-13 ENCOUNTER — Ambulatory Visit (INDEPENDENT_AMBULATORY_CARE_PROVIDER_SITE_OTHER): Payer: Medicare Other | Admitting: Family Medicine

## 2018-07-13 VITALS — BP 110/70 | HR 85 | Temp 98.5°F | Resp 12 | Ht 65.0 in | Wt 166.0 lb

## 2018-07-13 DIAGNOSIS — I10 Essential (primary) hypertension: Secondary | ICD-10-CM

## 2018-07-13 DIAGNOSIS — E7800 Pure hypercholesterolemia, unspecified: Secondary | ICD-10-CM

## 2018-07-13 DIAGNOSIS — R05 Cough: Secondary | ICD-10-CM

## 2018-07-13 DIAGNOSIS — J40 Bronchitis, not specified as acute or chronic: Secondary | ICD-10-CM | POA: Insufficient documentation

## 2018-07-13 DIAGNOSIS — J019 Acute sinusitis, unspecified: Secondary | ICD-10-CM

## 2018-07-13 DIAGNOSIS — R059 Cough, unspecified: Secondary | ICD-10-CM

## 2018-07-13 MED ORDER — DOXYCYCLINE HYCLATE 100 MG PO TABS: 100.0000 mg | ORAL_TABLET | Freq: Two times a day (BID) | ORAL | 0 refills | Status: DC

## 2018-07-13 MED ORDER — BENZONATATE 100 MG PO CAPS: 100.0000 mg | ORAL_CAPSULE | Freq: Three times a day (TID) | ORAL | 0 refills | Status: DC | PRN

## 2018-07-13 MED ORDER — PREDNISONE 20 MG PO TABS: 40.0000 mg | ORAL_TABLET | Freq: Every day | ORAL | 0 refills | Status: AC

## 2018-07-13 MED ORDER — ALBUTEROL SULFATE HFA 108 (90 BASE) MCG/ACT IN AERS
2.0000 | INHALATION_SPRAY | RESPIRATORY_TRACT | 0 refills | Status: DC | PRN
Start: 2018-07-13 — End: 2018-10-21

## 2018-07-13 MED ORDER — ALBUTEROL SULFATE (2.5 MG/3ML) 0.083% IN NEBU
2.5000 mg | INHALATION_SOLUTION | Freq: Once | RESPIRATORY_TRACT | Status: AC
  Administered 2018-07-13: 2.5 mg via RESPIRATORY_TRACT

## 2018-07-13 MED ORDER — IPRATROPIUM-ALBUTEROL 0.5-2.5 (3) MG/3ML IN SOLN
3.0000 mL | Freq: Once | RESPIRATORY_TRACT | Status: AC
  Administered 2018-07-13: 3 mL via RESPIRATORY_TRACT

## 2018-07-13 NOTE — Progress Notes (Signed)
Patient ID: Megan [name], female    DOB: July 31, 1951, 67 y.o.   MRN: [id number]  PCP: Alycia Rossetti, MD  Chief Complaint  Patient presents with  . Cough    onset of cough 2 weeks, chills, loss of appetite, loss of appetite     Subjective:   Megan [name] is a 67 y.o. female, presents to clinic with CC of 2 weeks of acutely worsening cough following at least a month or 2 of nasal discharge and congestion that has gradually worsened until became severe about 2 weeks ago with drainage becoming purulent, yellow to orange often times having blood tinges in it.  She feels very congested still with decreased energy, body aches, sweats.  Coughing began 2 weeks ago and is productive, gradually worsening.  She feels that her cough is triggered whenever she goes to breathe or speak and she only feels wheezy when she is coughing.  She has felt short of breath with activities that she normally can do such as walking several miles in the morning or walking her dogs.  Yesterday she was outside for little while and had to sit down and rest, which is unlike her.  Sitting down and at rest she does not feel any chest pain, pain on inspiration, tightness or shortness of breath.  Sputum has also been the same color as her nasal discharge yellow to orange with some blood streaking rarely.  Sweats only occurred yesterday and a little bit today.  She denies any hot or cold chills or known fever.    She has no history of lung disease, smoking history, or asthma history.  Denies any heart disease or diabetes.  She is treated for hypertension, hyperlipidemia and hypothyroidism.  Denies any immunocompromise.  States that she had pneumonia one time approximately 10 years ago has not had any other known lung disease or recurrent infections or bronchitis.  No smoking history at all.  She tends be very natural pathic in nature, uses supplements for most of her ailments.  She has been using rice yeast supplement to help  treat her cholesterol for 6 months and about 1 month ago started taking an old expired bottle of her statin medication and request a refill today.  She was last seen for well visit 4 months ago with lab work showing continued hyperlipidemia but improved from her prior labs.  She is not fasting today.   Patient Active Problem List   Diagnosis Date Noted  . Abdominal bloating with cramps 12/29/2017  . Hypertension 05/19/2017  . DDD (degenerative disc disease), lumbar 05/19/2017  . Hyperlipidemia 05/19/2017  . Borderline diabetes 05/19/2017  . Iron deficiency 05/19/2017  . Hypothyroidism 05/19/2017  . Leukopenia 05/04/2016     Prior to Admission medications   Medication Sig Start Date End Date Taking? Authorizing Provider  cyanocobalamin (V-R VITAMIN B-12) 500 MCG tablet Take 1 tablet (500 mcg total) by mouth daily. 05/19/17  Yes [place], Modena Nunnery, MD  Ferrous Sulfate (IRON) 325 (65 Fe) MG TABS Take by mouth as needed.   Yes [provider]  lisinopril (PRINIVIL,ZESTRIL) 10 MG tablet Take 1 tablet (10 mg total) by mouth daily. 07/06/18  Yes [place], Modena Nunnery, MD  Magnesium 250 MG TABS Take by mouth as needed.   Yes [provider]  Pyridoxine HCl (VITAMIN B-6 PO) Take by mouth every other day.   Yes [provider]  Red Yeast Rice Extract (RED YEAST RICE PO) Take by mouth daily.  Yes [provider]  vitamin C (ASCORBIC ACID) 500 MG tablet Take 1 tablet (500 mg total) by mouth daily. Patient taking differently: Take 500 mg by mouth as needed.  05/19/17  Yes [place], Modena Nunnery, MD     Allergies  Allergen Reactions  . Other Other (See Comments)    Nuts - pt states sometimes makes her gout flare up  . Hydrocodone     Makes her feel bad  . Naproxen     Makes her feel bad      Family History  Problem Relation Age of Onset  . Hypertension Mother   . Asthma Mother   . Hypertension Brother   . Other Father        Rare blood disease  . Colon cancer  Paternal Uncle   . Bladder Cancer Neg Hx   . Kidney cancer Neg Hx      Social History   Socioeconomic History  . Marital status: Married    Spouse name: Not on file  . Number of children: Not on file  . Years of education: Not on file  . Highest education level: Not on file  Occupational History  . Not on file  Social Needs  . Financial resource strain: Not on file  . Food insecurity:    Worry: Not on file    Inability: Not on file  . Transportation needs:    Medical: Not on file    Non-medical: Not on file  Tobacco Use  . Smoking status: Never Smoker  . Smokeless tobacco: Never Used  Substance and Sexual Activity  . Alcohol use: No  . Drug use: No  . Sexual activity: Yes  Lifestyle  . Physical activity:    Days per week: Not on file    Minutes per session: Not on file  . Stress: Not on file  Relationships  . Social connections:    Talks on phone: Not on file    Gets together: Not on file    Attends religious service: Not on file    Active member of club or organization: Not on file    Attends meetings of clubs or organizations: Not on file    Relationship status: Not on file  . Intimate partner violence:    Fear of current or ex partner: Not on file    Emotionally abused: Not on file    Physically abused: Not on file    Forced sexual activity: Not on file  Other Topics Concern  . Not on file  Social History Narrative  . Not on file     Review of Systems  Constitutional: Positive for activity change, diaphoresis and fatigue. Negative for appetite change, chills, fever and unexpected weight change.  HENT: Positive for congestion, postnasal drip, rhinorrhea, sinus pressure, sinus pain and sneezing. Negative for ear discharge, ear pain, nosebleeds, sore throat, tinnitus, trouble swallowing and voice change.   Eyes: Negative.   Respiratory: Negative for apnea, choking and stridor.   Cardiovascular: Negative for chest pain, palpitations and leg swelling.    Gastrointestinal: Negative.   Endocrine: Negative.   Genitourinary: Negative.   Musculoskeletal: Negative.   Neurological: Negative.   Hematological: Negative.   Psychiatric/Behavioral: Negative.        Objective:    Vitals:   07/13/18 1110  BP: 110/70  Pulse: 85  Temp: 98.5 F (36.9 C)  TempSrc: Oral  SpO2: 96%  Weight: 166 lb (75.3 kg)  Height: 5\' 5"  (1.651 m)  Physical Exam  Constitutional: She is oriented to person, place, and time. She appears well-developed and well-nourished.  Non-toxic appearance. No distress.  NAD, non-toxic appearing  HENT:  Head: Normocephalic and atraumatic.  Right Ear: Tympanic membrane, external ear and ear canal normal.  Left Ear: Tympanic membrane, external ear and ear canal normal.  Nose: Mucosal edema and rhinorrhea present. Right sinus exhibits maxillary sinus tenderness and frontal sinus tenderness. Left sinus exhibits maxillary sinus tenderness and frontal sinus tenderness.  Mouth/Throat: Uvula is midline, oropharynx is clear and moist and mucous membranes are normal. No oropharyngeal exudate.  Moderate to severe nasal congestion with purulent discharge, diffuse erythema and edema  Eyes: Pupils are equal, round, and reactive to light. Conjunctivae, EOM and lids are normal.  Neck: Trachea normal, normal range of motion and phonation normal. Neck supple. No tracheal deviation present.  Cardiovascular: Normal rate, regular rhythm, normal heart sounds and normal pulses. Exam reveals no gallop and no friction rub.  No murmur heard. Pulses:      Radial pulses are 2+ on the right side, and 2+ on the left side.       Posterior tibial pulses are 2+ on the right side, and 2+ on the left side.  Pulmonary/Chest: Effort normal. No accessory muscle usage or stridor. No tachypnea. No respiratory distress. She has decreased breath sounds in the right lower field, the left middle field and the left lower field. She has wheezes. She has rhonchi  (scattered). She has rales in the right lower field, the left middle field and the left lower field. She exhibits no tenderness.  Abdominal: Soft. Normal appearance and bowel sounds are normal. She exhibits no distension. There is no tenderness. There is no rebound and no guarding.  Musculoskeletal: Normal range of motion. She exhibits no edema or deformity.  Lymphadenopathy:    She has no cervical adenopathy.  Neurological: She is alert and oriented to person, place, and time. She exhibits normal muscle tone. Gait normal.  Skin: Skin is warm, dry and intact. Capillary refill takes less than 2 seconds. No rash noted. She is not diaphoretic. No cyanosis. No pallor. Nails show no clubbing.  Psychiatric: She has a normal mood and affect. Her speech is normal and behavior is normal.  Nursing note and vitals reviewed.   Pulm exam repeated after breathing tx Rales and rhonchi persist to LLL, wheeze improved but not cleared, no distress, pt notes improvement with chest congestion      Assessment & Plan:     ICD-10-CM   1. Cough R05 DG Chest 2 View  Suspect CAP vs bronchiectasis/bronchitis Most atalectasis, rhonchi and coarse rales to LLlung field, some mild to RLL, BS improved with breathing tx, pt continued to have exp wheeze.  ipratropium-albuterol (DUONEB) 0.5-2.5 (3) MG/3ML nebulizer solution 3 mL  CXR Steroid burst albuterol inhaler, cough meds and mucinex  albuterol (PROVENTIL) (2.5 MG/3ML) 0.083% nebulizer solution 2.5 mg    predniSONE (DELTASONE) 20 MG tablet    albuterol (PROVENTIL HFA;VENTOLIN HFA) 108 (90 Base) MCG/ACT inhaler    benzonatate (TESSALON) 100 MG capsule      2. Acute sinusitis, recurrence not specified, unspecified location  Sx for at least 4 weeks, worsening, will tx for acute bacterial sinusitis J01.90 doxycycline (VIBRA-TABS) 100 MG tablet  3. Pure hypercholesterolemia E78.00 CBC with Differential/Platelet    COMPLETE METABOLIC PANEL WITH GFR    Lipid panel  4.  Essential hypertension I10 CBC with Differential/Platelet    COMPLETE METABOLIC PANEL WITH GFR  Pt has been stopping statin and trying other supplements, but restarted her statins a month ago with old expired prescription. Last routine visit for HLD/HTN was 4 months ago, labs done then and 4 months before that, will check with PCP and refill after pt gets labs done.  Future labs ordered - she was not fasting today.   Megan Grana, PA-C 07/13/18 11:18 AM

## 2018-07-13 NOTE — Patient Instructions (Addendum)
Go get the Xray done   Recheck in 2 weeks  Take steroids and antibiotics, use inhaler often as prescribed for coughing fits, wheeze, tightness in chest or shortness of breath.  Come by to do fasting labs.  I will call you with results and do refill of cholesterol medicine once I have labs and talk to your PCP

## 2018-07-27 ENCOUNTER — Other Ambulatory Visit: Payer: Medicare Other

## 2018-07-27 ENCOUNTER — Ambulatory Visit: Payer: Medicare Other | Admitting: Family Medicine

## 2018-07-27 ENCOUNTER — Telehealth: Payer: Self-pay | Admitting: Family Medicine

## 2018-07-27 DIAGNOSIS — E7800 Pure hypercholesterolemia, unspecified: Secondary | ICD-10-CM

## 2018-07-27 DIAGNOSIS — I10 Essential (primary) hypertension: Secondary | ICD-10-CM | POA: Diagnosis not present

## 2018-07-27 LAB — COMPLETE METABOLIC PANEL WITH GFR
AG Ratio: 1.3 (calc) (ref 1.0–2.5)
ALBUMIN MSPROF: 3.8 g/dL (ref 3.6–5.1)
ALKALINE PHOSPHATASE (APISO): 81 U/L (ref 33–130)
ALT: 17 U/L (ref 6–29)
AST: 22 U/L (ref 10–35)
BILIRUBIN TOTAL: 0.3 mg/dL (ref 0.2–1.2)
BUN: 15 mg/dL (ref 7–25)
CHLORIDE: 103 mmol/L (ref 98–110)
CO2: 29 mmol/L (ref 20–32)
Calcium: 10.2 mg/dL (ref 8.6–10.4)
Creat: 0.81 mg/dL (ref 0.50–0.99)
GFR, Est African American: 86 mL/min/{1.73_m2} (ref >=60)
GFR, Est Non African American: 75 mL/min/{1.73_m2} (ref >=60)
GLUCOSE: 92 mg/dL (ref 65–99)
Globulin: 3 g/dL (calc) (ref 1.9–3.7)
Potassium: 4.7 mmol/L (ref 3.5–5.3)
Sodium: 140 mmol/L (ref 135–146)
Total Protein: 6.8 g/dL (ref 6.1–8.1)

## 2018-07-27 LAB — CBC WITH DIFFERENTIAL/PLATELET
BASOS PCT: 0.3 %
Basophils Absolute: 10 cells/uL (ref 0–200)
Eosinophils Absolute: 218 cells/uL (ref 15–500)
Eosinophils Relative: 6.8 %
HEMATOCRIT: 36.5 % (ref 35.0–45.0)
HEMOGLOBIN: 12 g/dL (ref 11.7–15.5)
Lymphs Abs: 1238 cells/uL (ref 850–3900)
MCH: 29.4 pg (ref 27.0–33.0)
MCHC: 32.9 g/dL (ref 32.0–36.0)
MCV: 89.5 fL (ref 80.0–100.0)
MPV: 11.6 fL (ref 7.5–12.5)
Monocytes Relative: 14.6 %
Neutro Abs: 1267 cells/uL — ABNORMAL LOW (ref 1500–7800)
Neutrophils Relative %: 39.6 %
Platelets: 228 10*3/uL (ref 140–400)
RBC: 4.08 10*6/uL (ref 3.80–5.10)
RDW: 12.2 % (ref 11.0–15.0)
Total Lymphocyte: 38.7 %
WBC: 3.2 10*3/uL — AB (ref 3.8–10.8)
WBCMIX: 467 {cells}/uL (ref 200–950)

## 2018-07-27 LAB — LIPID PANEL
Cholesterol: 195 mg/dL (ref <200)
HDL: 81 mg/dL (ref >50)
LDL CHOLESTEROL (CALC): 98 mg/dL
NON-HDL CHOLESTEROL (CALC): 114 mg/dL (ref <130)
TRIGLYCERIDES: 71 mg/dL (ref <150)
Total CHOL/HDL Ratio: 2.4 (calc) (ref <5.0)

## 2018-07-27 MED ORDER — ATORVASTATIN CALCIUM 10 MG PO TABS: 10.0000 mg | ORAL_TABLET | Freq: Every day | ORAL | 0 refills | Status: DC

## 2018-07-27 NOTE — Telephone Encounter (Signed)
Patient had labs obtained on 07/27/2018.  Prescription sent to pharmacy.

## 2018-07-27 NOTE — Telephone Encounter (Signed)
Ok to refill 

## 2018-07-27 NOTE — Telephone Encounter (Signed)
She needs to do her labs, that was the plan.  You can do one Rx for #30 with 0 refills. She came in for cough, asked about other chronic health issues, is not my pt, I checked her hx, she needed labs, she still has not done them.  They will not be refilled again without an appointment specifically for her chronic issues.

## 2018-07-27 NOTE — Telephone Encounter (Signed)
Pt needs refill on her lipitor states she discussed it with leisa on her last OV, pt had discontinued it but would like to restart it again, please send to [facility] [place] if approved by tapia.

## 2018-10-18 ENCOUNTER — Other Ambulatory Visit: Payer: Self-pay | Admitting: Family Medicine

## 2018-10-21 ENCOUNTER — Other Ambulatory Visit: Payer: Self-pay

## 2018-10-21 ENCOUNTER — Encounter: Payer: Self-pay | Admitting: *Deleted

## 2018-10-21 ENCOUNTER — Ambulatory Visit (INDEPENDENT_AMBULATORY_CARE_PROVIDER_SITE_OTHER): Payer: Medicare Other | Admitting: Family Medicine

## 2018-10-21 ENCOUNTER — Encounter: Payer: Self-pay | Admitting: Family Medicine

## 2018-10-21 VITALS — BP 116/72 | HR 62 | Temp 98.2°F | Resp 14 | Ht 65.0 in | Wt 171.0 lb

## 2018-10-21 DIAGNOSIS — E7800 Pure hypercholesterolemia, unspecified: Secondary | ICD-10-CM

## 2018-10-21 DIAGNOSIS — N644 Mastodynia: Secondary | ICD-10-CM

## 2018-10-21 DIAGNOSIS — I10 Essential (primary) hypertension: Secondary | ICD-10-CM

## 2018-10-21 DIAGNOSIS — M1009 Idiopathic gout, multiple sites: Secondary | ICD-10-CM

## 2018-10-21 MED ORDER — PREDNISONE 10 MG PO TABS: ORAL_TABLET | ORAL | 0 refills | Status: DC

## 2018-10-21 MED ORDER — ATORVASTATIN CALCIUM 10 MG PO TABS: 10.0000 mg | ORAL_TABLET | Freq: Every day | ORAL | 2 refills | Status: DC

## 2018-10-21 MED ORDER — LISINOPRIL 10 MG PO TABS: 10.0000 mg | ORAL_TABLET | Freq: Every day | ORAL | 2 refills | Status: DC

## 2018-10-21 NOTE — Progress Notes (Signed)
Subjective:    Patient ID: Megan [name], female    DOB: 08-06-1951, 67 y.o.   MRN: [id number]  Patient presents for Gout Flare (x1 week- gout flare in R hand with swelling and pain- now moving into L hand/ thumbsome)   She has had gout in the past mostly in her toes   She work up 1 week or so ago, with swelling in her right hand , base of thumb the worst . No injury to hand. States it has gone down a lot, but still in pain and still has some swelling Uric acid was normal at 4.4 earlier this year Took cherry tart juice which helped and some ibuprofen.   Due for her  mammogram but has Felt tingling in her breast 2 weeks,ago and had a sore spot in right side.  She has not noticed any lump or not.  She has not noticed any redness has not had any drainage from the nipple.  Hyperliidemia- needs refill on lipitor          Review Of Systems:  GEN- denies fatigue, fever, weight loss,weakness, recent illness HEENT- denies eye drainage, change in vision, nasal discharge, CVS- denies chest pain, palpitations RESP- denies SOB, cough, wheeze ABD- denies N/V, change in stools, abd pain GU- denies dysuria, hematuria, dribbling, incontinence MSK- + joint pain, muscle aches, injury Neuro- denies headache, dizziness, syncope, seizure activity       Objective:    BP 116/72   Pulse 62   Temp 98.2 F (36.8 C) (Oral)   Resp 14   Ht 5\' 5"  (1.651 m)   Wt 171 lb (77.6 kg)   SpO2 98%   BMI 28.46 kg/m  GEN- NAD, alert and oriented x3 HEENT- PERRL, EOMI, non injected sclera, pink conjunctiva, MMM, oropharynx clear Neck- Supple, no thyromegaly CVS- RRR, no murmur RESP-CTAB GEN- NAD, alert and oriented, Neck- supple, no thyromegaly Breast- normal symmetry, no nipple inversion,no nipple drainage, no nodules or lumps felt, TTP at 1 oclock position right breast and 11 oclock in Left, fibrous density lower portion of breast  Nodes- no axillary nodes MSK- Right hand, swelling at base of thumb   And wrist, able to make a fist, fair ROM WRist hand, compared to left hand , no erythema, no warmth FROM Elbow/shoulder, no effusion elbow           Assessment & Plan:      Problem List Items Addressed This Visit      Unprioritized   Hyperlipidemia    Refilled lipitor Given handout on foodstlo avoid  for gout as well      Relevant Medications   atorvastatin (LIPITOR) 10 MG tablet   lisinopril (PRINIVIL,ZESTRIL) 10 MG tablet   Hypertension    Well controlled no changes      Relevant Medications   atorvastatin (LIPITOR) 10 MG tablet   lisinopril (PRINIVIL,ZESTRIL) 10 MG tablet    Other Visit Diagnoses    Acute idiopathic gout of multiple sites    -  Primary   Recheck uric acid, given steroid dosepak   Relevant Orders   CBC with Differential/Platelet (Completed)   Basic metabolic panel (Completed)   Uric Acid (Completed)   Sedimentation Rate (Completed)   Breast pain       No specific mass palpated, obtain diagnostic mammogram   Relevant Orders   MM DIAG BREAST TOMO BILATERAL      Note: This dictation was prepared with Dragon dictation along with smaller phrase technology.  Any transcriptional errors that result from this process are unintentional.

## 2018-10-21 NOTE — Patient Instructions (Addendum)
We will call with results Take the steroids Mammogram to be scheduled F/U 3-4 months for Physical

## 2018-10-22 LAB — CBC WITH DIFFERENTIAL/PLATELET
BASOS PCT: 0.2 %
Basophils Absolute: 8 cells/uL (ref 0–200)
Eosinophils Absolute: 271 cells/uL (ref 15–500)
Eosinophils Relative: 6.6 %
HCT: 31.6 % — ABNORMAL LOW (ref 35.0–45.0)
HEMOGLOBIN: 10.8 g/dL — AB (ref 11.7–15.5)
Lymphs Abs: 1382 cells/uL (ref 850–3900)
MCH: 29.6 pg (ref 27.0–33.0)
MCHC: 34.2 g/dL (ref 32.0–36.0)
MCV: 86.6 fL (ref 80.0–100.0)
MONOS PCT: 10.1 %
MPV: 11.4 fL (ref 7.5–12.5)
NEUTROS ABS: 2025 {cells}/uL (ref 1500–7800)
Neutrophils Relative %: 49.4 %
PLATELETS: 248 10*3/uL (ref 140–400)
RBC: 3.65 10*6/uL — AB (ref 3.80–5.10)
RDW: 12.2 % (ref 11.0–15.0)
TOTAL LYMPHOCYTE: 33.7 %
WBC mixed population: 414 cells/uL (ref 200–950)
WBC: 4.1 10*3/uL (ref 3.8–10.8)

## 2018-10-22 LAB — BASIC METABOLIC PANEL
BUN: 12 mg/dL (ref 7–25)
CO2: 26 mmol/L (ref 20–32)
Calcium: 9.8 mg/dL (ref 8.6–10.4)
Chloride: 104 mmol/L (ref 98–110)
Creat: 0.82 mg/dL (ref 0.50–0.99)
GLUCOSE: 79 mg/dL (ref 65–99)
Potassium: 4.1 mmol/L (ref 3.5–5.3)
SODIUM: 138 mmol/L (ref 135–146)

## 2018-10-22 LAB — SEDIMENTATION RATE: SED RATE: 56 mm/h — AB (ref 0–30)

## 2018-10-22 LAB — URIC ACID: Uric Acid, Serum: 5 mg/dL (ref 2.5–7.0)

## 2018-10-23 NOTE — Assessment & Plan Note (Signed)
Refilled lipitor Given handout on foodstlo avoid  for gout as well

## 2018-10-23 NOTE — Assessment & Plan Note (Signed)
Well controlled no changes 

## 2018-10-25 ENCOUNTER — Other Ambulatory Visit: Payer: Self-pay | Admitting: *Deleted

## 2018-10-25 DIAGNOSIS — N644 Mastodynia: Secondary | ICD-10-CM

## 2018-10-28 ENCOUNTER — Other Ambulatory Visit: Payer: Self-pay | Admitting: Family Medicine

## 2018-10-31 NOTE — Telephone Encounter (Signed)
Call pt and see why she is refilling again See if hand swelling went down

## 2018-11-02 ENCOUNTER — Telehealth: Payer: Self-pay | Admitting: Family Medicine

## 2018-11-02 NOTE — Telephone Encounter (Signed)
Please see labs for more information.

## 2018-11-02 NOTE — Telephone Encounter (Signed)
Patient calling about lab work results from last visit  [phone]

## 2018-11-03 ENCOUNTER — Other Ambulatory Visit: Payer: Self-pay | Admitting: *Deleted

## 2018-11-03 DIAGNOSIS — D649 Anemia, unspecified: Secondary | ICD-10-CM

## 2018-11-12 ENCOUNTER — Emergency Department (HOSPITAL_COMMUNITY)
Admission: EM | Admit: 2018-11-12 | Discharge: 2018-11-12 | Disposition: A | Discharge status: Home / Self Care | Payer: Medicare Other | Attending: Emergency Medicine | Admitting: Emergency Medicine

## 2018-11-12 ENCOUNTER — Other Ambulatory Visit: Payer: Self-pay

## 2018-11-12 ENCOUNTER — Encounter (HOSPITAL_COMMUNITY): Payer: Self-pay | Admitting: Emergency Medicine

## 2018-11-12 ENCOUNTER — Emergency Department (HOSPITAL_COMMUNITY): Payer: Medicare Other

## 2018-11-12 DIAGNOSIS — M79641 Pain in right hand: Secondary | ICD-10-CM | POA: Diagnosis not present

## 2018-11-12 DIAGNOSIS — Z79899 Other long term (current) drug therapy: Secondary | ICD-10-CM | POA: Insufficient documentation

## 2018-11-12 DIAGNOSIS — I10 Essential (primary) hypertension: Secondary | ICD-10-CM | POA: Insufficient documentation

## 2018-11-12 DIAGNOSIS — E039 Hypothyroidism, unspecified: Secondary | ICD-10-CM | POA: Diagnosis not present

## 2018-11-12 HISTORY — DX: Gout, unspecified: M10.9

## 2018-11-12 MED ORDER — PREDNISONE 20 MG PO TABS: 40.0000 mg | ORAL_TABLET | Freq: Every day | ORAL | 0 refills | Status: DC

## 2018-11-12 MED ORDER — HYDROCODONE-ACETAMINOPHEN 5-325 MG PO TABS: 1.0000 | ORAL_TABLET | Freq: Four times a day (QID) | ORAL | 0 refills | Status: DC | PRN

## 2018-11-12 MED ORDER — PREDNISONE 20 MG PO TABS: 40.0000 mg | ORAL_TABLET | Freq: Every day | ORAL | 0 refills | Status: AC

## 2018-11-12 NOTE — ED Provider Notes (Signed)
Medical screening examination/treatment/procedure(s) were conducted as a shared visit with non-physician practitioner(s) and myself.  I personally evaluated the patient during the encounter.  None   Results for orders placed or performed in visit on 10/21/18  CBC with Differential/Platelet  Result Value Ref Range   WBC 4.1 3.8 - 10.8 Thousand/uL   RBC 3.65 (L) 3.80 - 5.10 Million/uL   Hemoglobin 10.8 (L) 11.7 - 15.5 g/dL   HCT 31.6 (L) 35.0 - 45.0 %   MCV 86.6 80.0 - 100.0 fL   MCH 29.6 27.0 - 33.0 pg   MCHC 34.2 32.0 - 36.0 g/dL   RDW 12.2 11.0 - 15.0 %   Platelets 248 140 - 400 Thousand/uL   MPV 11.4 7.5 - 12.5 fL   Neutro Abs 2,025 1,500 - 7,800 cells/uL   Lymphs Abs 1,382 850 - 3,900 cells/uL   WBC mixed population 414 200 - 950 cells/uL   Eosinophils Absolute 271 15 - 500 cells/uL   Basophils Absolute 8 0 - 200 cells/uL   Neutrophils Relative % 49.4 %   Total Lymphocyte 33.7 %   Monocytes Relative 10.1 %   Eosinophils Relative 6.6 %   Basophils Relative 0.2 %  Basic metabolic panel  Result Value Ref Range   Glucose, Bld 79 65 - 99 mg/dL   BUN 12 7 - 25 mg/dL   Creat 0.82 0.50 - 0.99 mg/dL   BUN/Creatinine Ratio NOT APPLICABLE 6 - 22 (calc)   Sodium 138 135 - 146 mmol/L   Potassium 4.1 3.5 - 5.3 mmol/L   Chloride 104 98 - 110 mmol/L   CO2 26 20 - 32 mmol/L   Calcium 9.8 8.6 - 10.4 mg/dL  Uric Acid  Result Value Ref Range   Uric Acid, Serum 5.0 2.5 - 7.0 mg/dL  Sedimentation Rate  Result Value Ref Range   Sed Rate 56 (H) 0 - 30 mm/h   Dg Hand Complete Right  Result Date: 11/12/2018 CLINICAL DATA:  Pain for 2 months, improved on steroids but has returned after steroid completion. EXAM: RIGHT HAND - COMPLETE 3+ VIEW COMPARISON:  None. FINDINGS: Severe degenerative changes between the base of the first metacarpal and the trapezium without associated bony erosion. They were acute abnormalities noted. IMPRESSION: Severe degenerative changes between the base of the first  metacarpal and trapezium. Electronically Signed   By: Dorise Bullion III M.D   On: 11/12/2018 10:18    Patient seen by me along with physician assistant.  Patient with a lot of swelling to her right thumb side of her wrist.  She has had some pain there for 2 months seen with by her primary care doctor diagnosed with gout was given a 4 to 5-day course of steroids with some improvement but it never improved completely.  Now with increased swelling and increased pain again.  Patient's had similar findings in her feet.  Patient definitely with some inflammation and swelling over the snuffbox area x-rays seem to be consistent with inflammatory arthritis.  Gout not completely ruled out but based on the x-ray is more suggestive of not being gout.  Patient does have an elevated sed rate of 56.  Her uric acid was normal and these are based on labs from November.  Clinically consistent with inflammatory arthritis no concern for septic joint.  Will not retreat with steroids will have her follow-up with her regular doctor and just treat with pain medication at this point.  Patient does have good cap refill to the fingers.  No pain with range of motion at the elbow or shoulder.  Radial pulses 2+.   Fredia Sorrow, MD 11/12/18 1049

## 2018-11-12 NOTE — ED Provider Notes (Signed)
[facility] EMERGENCY DEPARTMENT Provider Note   CSN: [id number] Arrival date & time: 11/12/18  4010     History   Chief Complaint Chief Complaint  Patient presents with  . Hand Pain    HPI Megan [name] is a 67 y.o. female with past medical history significant for hypercholesterolemia, hypertension, leukopenia, gout who presents for evaluation of right hand pain.  Patient states she has had intermittent right hand pain x2 months.  Was seen by her PCP approximately 1 month ago and was told this is most likely gout.  Was given 4-day course of steroids at that time.  Patient states is helped with her swelling and pain, however this returned after completion of steroids.  Patient states she has had increased pain and swelling to her left thumb mostly over the base of the first metacarpal and some pain to the left radial side of wrist over trapezium.  Has had noted swelling.  Denies warmth.  Has had some mild erythema to the area.  Denies IV drug use or immunocompromise state.  Has given ibuprofen with mild relief of her symptoms.  Rates her pain a 7/10.  Pain does not radiate.  Denies additional aggravating or alleviating factors.  Admits to history of gout, however her previous gout flares have only been located to the great toes.  Denies fever, chills, nausea, vomiting, numbness or tingling in her extremities, decreased range of motion, recent trauma or injury.   History obtained from patient.  No interpreter was used.  HPI  Past Medical History:  Diagnosis Date  . Gout   . High cholesterol   . Hypertension   . Kidney stone   . Leukopenia 05/04/2016  . Seasonal allergies     Patient Active Problem List   Diagnosis Date Noted  . Abdominal bloating with cramps 12/29/2017  . Hypertension 05/19/2017  . DDD (degenerative disc disease), lumbar 05/19/2017  . Hyperlipidemia 05/19/2017  . Borderline diabetes 05/19/2017  . Iron deficiency 05/19/2017  . Hypothyroidism 05/19/2017  .  Leukopenia 05/04/2016    Past Surgical History:  Procedure Laterality Date  . BILATERAL KNEE ARTHROSCOPY    . COLONOSCOPY N/A 07/30/2014   Procedure: COLONOSCOPY;  Surgeon: Danie Binder, MD;  Location: [facility];  Service: Endoscopy;  Laterality: N/A;  9:30 AM  . high cholesterol    . Right knee osteotomy       OB History   No obstetric history on file.      Home Medications    Prior to Admission medications   Medication Sig Start Date End Date Taking? Authorizing Provider  atorvastatin (LIPITOR) 10 MG tablet Take 1 tablet (10 mg total) by mouth daily. 10/21/18  Yes [place], Modena Nunnery, MD  cyanocobalamin (V-R VITAMIN B-12) 500 MCG tablet Take 1 tablet (500 mcg total) by mouth daily. 05/19/17  Yes [place], Modena Nunnery, MD  Ferrous Sulfate (IRON) 325 (65 Fe) MG TABS Take by mouth as needed.   Yes [provider]  ibuprofen (ADVIL,MOTRIN) 200 MG tablet Take 200 mg by mouth every 6 (six) hours as needed.   Yes [provider]  lisinopril (PRINIVIL,ZESTRIL) 10 MG tablet Take 1 tablet (10 mg total) by mouth daily. 10/21/18  Yes [place], Modena Nunnery, MD  Magnesium 250 MG TABS Take by mouth as needed.   Yes [provider]  Pyridoxine HCl (VITAMIN B-6 PO) Take by mouth every other day.   Yes [provider]  vitamin C (ASCORBIC ACID) 500 MG tablet  Take 1 tablet (500 mg total) by mouth daily. Patient taking differently: Take 500 mg by mouth as needed.  05/19/17  Yes [place], Modena Nunnery, MD  HYDROcodone-acetaminophen (NORCO/VICODIN) 5-325 MG tablet Take 1 tablet by mouth every 6 (six) hours as needed for severe pain. 11/12/18   Henderly, Britni A, PA-C  predniSONE (DELTASONE) 20 MG tablet Take 2 tablets (40 mg total) by mouth daily for 5 days. 11/12/18 11/17/18  Henderly, Britni A, PA-C    Family History Family History  Problem Relation Age of Onset  . Hypertension Mother   . Asthma Mother   . Hypertension Brother   . Other Father        Rare blood  disease  . Colon cancer Paternal Uncle   . Bladder Cancer Neg Hx   . Kidney cancer Neg Hx     Social History Social History   Tobacco Use  . Smoking status: Never Smoker  . Smokeless tobacco: Never Used  Substance Use Topics  . Alcohol use: No  . Drug use: No     Allergies   Other; Hydrocodone; and Naproxen   Review of Systems Review of Systems  Constitutional: Negative for chills, fatigue and fever.  Respiratory: Negative for cough and shortness of breath.   Cardiovascular: Negative for chest pain and leg swelling.  Musculoskeletal: Positive for arthralgias and joint swelling. Negative for back pain, gait problem, myalgias, neck pain and neck stiffness.  Skin: Negative for color change, rash and wound.  Neurological: Negative for weakness and numbness.  All other systems reviewed and are negative.    Physical Exam Updated Vital Signs BP (!) 146/88 (BP Location: Left Arm)   Pulse 76   Temp 98.5 F (36.9 C) (Oral)   Resp 18   Ht 5' 5.5" (1.664 m)   Wt 74.8 kg   SpO2 100%   BMI 27.04 kg/m   Physical Exam Vitals signs and nursing note reviewed.  Constitutional:      General: She is not in acute distress.    Appearance: Normal appearance. She is well-developed. She is not ill-appearing, toxic-appearing or diaphoretic.  HENT:     Head: Normocephalic and atraumatic.  Eyes:     Pupils: Pupils are equal, round, and reactive to light.  Neck:     Musculoskeletal: Normal range of motion.  Cardiovascular:     Rate and Rhythm: Normal rate.  Pulmonary:     Effort: No respiratory distress.  Abdominal:     General: There is no distension.  Musculoskeletal: Normal range of motion.     Comments: Tenderness to palpation over the right first metacarpal and trapezium. Mild edema with mild erythema to first metatarsal. No warmth. Full ROM bilateral upper extremities.  Skin:    General: Skin is warm and dry.     Coloration: Skin is not pale.     Findings: No bruising,  lesion or rash.     Comments: No rashes, lesions ecchymosis to bilateral upper extremity.  Neurological:     Mental Status: She is alert.     Comments: Intact sensation to sharp and dull to bilateral upper extremity. 5/5 strength to bilateral upper extremity except for thumb opposition to right upper extremity 4/5 secondary to pain.        ED Treatments / Results  Labs (all labs ordered are listed, but only abnormal results are displayed) Labs Reviewed - No data to display  EKG None  Radiology Dg Hand Complete Right  Result Date: 11/12/2018 CLINICAL DATA:  Pain for 2 months, improved on steroids but has returned after steroid completion. EXAM: RIGHT HAND - COMPLETE 3+ VIEW COMPARISON:  None. FINDINGS: Severe degenerative changes between the base of the first metacarpal and the trapezium without associated bony erosion. They were acute abnormalities noted. IMPRESSION: Severe degenerative changes between the base of the first metacarpal and trapezium. Electronically Signed   By: Dorise Bullion III M.D   On: 11/12/2018 10:18    Procedures Procedures (including critical care time)  Medications Ordered in ED Medications - No data to display   Initial Impression / Assessment and Plan / ED Course  I have reviewed the triage vital signs and the nursing notes.  Pertinent labs & imaging results that were available during my care of the patient were reviewed by me and considered in my medical decision making (see chart for details).  67 year old female with history of gout who appears otherwise well presents for evaluation of right hand pain.  Intermittent over the last 2 months.  Has been seen by PCP who was diagnosed with gout.  Was given 4-day course of steroids with relief of symptoms, however symptoms returned after completion of steroids.  Denies history of IV drug use or immunocompromise state.  Tenderness palpation over base of first metacarpal on right hand as well as over  trapezius.  Mild edema with erythema.  No streaking.  No evidence of cellulitis.  Low suspicion for septic joint, abscess or cellulitis.  Seen at PCP 2 months ago.  Did have elevation in sed rate at that time at 56.  Uric acid at that time was negative and she did not have any leukocytosis.  Will obtain plain film and reevaluate.  Plain film with degenerative changes between base of first metacarpal and trapezium.  No evidence of fracture or dislocation.  Exam most consistent with inflammatory arthritis.  Will treat pain as well as anti-inflammatories and have patient follow-up with PCP for reevaluation.   Afebrile, nonseptic, non-ill-appearing.  Patient is hemodynamically stable and appropriate for DC home at this time. Discussed return precautions as well as close follow-up with PCP.  Patient voiced understanding return precautions and is agreeable for follow-up.  Patient was seen and evaluated by my attending, Dr. Rogene Houston agrees with above treatment, plan and disposition.    Final Clinical Impressions(s) / ED Diagnoses   Final diagnoses:  Right hand pain    ED Discharge Orders         Ordered    predniSONE (DELTASONE) 20 MG tablet  Daily,   Status:  Discontinued     11/12/18 1103    HYDROcodone-acetaminophen (NORCO/VICODIN) 5-325 MG tablet  Every 6 hours PRN     11/12/18 1109    predniSONE (DELTASONE) 20 MG tablet  Daily     11/12/18 1113           Henderly, Britni A, PA-C 11/12/18 1118    Fredia Sorrow, MD 11/16/18 1054

## 2018-11-12 NOTE — ED Triage Notes (Signed)
Patient c/o right hand pain x2 months. Per patient seen by PCP and diagnosed with gout. Patient states given 4-5 days of steroids with some improvement but never went away and pain and swelling has increased again since steroids complete. Denies any fevers.

## 2018-11-12 NOTE — Discharge Instructions (Addendum)
You were evaluated today for right hand pain.  This is likely arthritis. I have prescribed you Steroids as well as medication for your pain. Please take as prescribed. Do not drive while taking pain medication. Please follow up with your PCP on Monday for re-evaluation. I have also referred you to a hand surgeon for re-evaluation. Please call the number on your discharge paperwork to schedule an appointment.   Return to the ED with any new or worsening symptoms  Contact a doctor if: You have another gout attack. You still have symptoms of a gout attack after10 days of treatment. You have problems (side effects) because of your medicines. You have chills or a fever. You have burning pain when you pee (urinate). You have pain in your lower back or belly. Get help right away if: You have very bad pain. Your pain cannot be controlled. You cannot pee.

## 2018-11-15 ENCOUNTER — Ambulatory Visit (HOSPITAL_COMMUNITY): Payer: Medicare Other

## 2018-11-15 ENCOUNTER — Ambulatory Visit (HOSPITAL_COMMUNITY)
Admission: RE | Admit: 2018-11-15 | Discharge: 2018-11-15 | Disposition: A | Discharge status: Home / Self Care | Payer: Medicare Other | Source: Ambulatory Visit | Attending: Family Medicine | Admitting: Family Medicine

## 2018-11-15 ENCOUNTER — Ambulatory Visit (HOSPITAL_COMMUNITY): Admission: RE | Admit: 2018-11-15 | Payer: Medicare Other | Source: Ambulatory Visit

## 2018-11-15 DIAGNOSIS — N644 Mastodynia: Secondary | ICD-10-CM

## 2018-11-15 DIAGNOSIS — R928 Other abnormal and inconclusive findings on diagnostic imaging of breast: Secondary | ICD-10-CM | POA: Diagnosis not present

## 2018-11-21 ENCOUNTER — Encounter: Payer: Self-pay | Admitting: Orthopedic Surgery

## 2018-11-21 ENCOUNTER — Encounter

## 2018-11-21 ENCOUNTER — Ambulatory Visit: Payer: Medicare Other | Admitting: Orthopedic Surgery

## 2018-11-21 VITALS — BP 147/82 | HR 79 | Ht 65.0 in | Wt 162.0 lb

## 2018-11-21 DIAGNOSIS — S63054A Dislocation of other carpometacarpal joint of right hand, initial encounter: Secondary | ICD-10-CM

## 2018-11-21 NOTE — Progress Notes (Signed)
NEW PROBLEM OFFICE VISIT  Chief Complaint  Patient presents with  . 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  Musculoskeletal: Positive for joint pain.  Skin: Negative.      Past Medical History:  Diagnosis Date  . Gout   . High cholesterol   . Hypertension   . Kidney stone   . Leukopenia 05/04/2016  . Seasonal allergies     Past Surgical History:  Procedure Laterality Date  . BILATERAL KNEE ARTHROSCOPY    . COLONOSCOPY N/A 07/30/2014   Procedure: COLONOSCOPY;  Surgeon: Danie Binder, MD;  Location: [facility];  Service: Endoscopy;  Laterality: N/A;  9:30 AM  . high cholesterol    . Right knee osteotomy      Family History  Problem Relation Age of Onset  . Hypertension Mother   . Asthma Mother   . Hypertension Brother   . Other Father        Rare blood disease  . Colon cancer Paternal Uncle   . Bladder Cancer Neg Hx   . Kidney cancer Neg Hx    Social History   Tobacco Use  . Smoking status: Never Smoker  . Smokeless tobacco: Never Used  Substance Use Topics  . Alcohol use: No  . Drug use: No    Allergies  Allergen Reactions  . Other Other (See Comments)    Nuts - pt states sometimes makes her gout flare up  . Hydrocodone     Makes her feel bad  . Naproxen     Makes her feel bad     Current Meds  Medication Sig  . atorvastatin (LIPITOR) 10 MG tablet Take 1 tablet (10 mg total) by mouth daily.  . cyanocobalamin (V-R VITAMIN B-12) 500 MCG tablet Take 1 tablet (500 mcg total) by mouth daily.  . Ferrous Sulfate (IRON) 325 (65 Fe) MG TABS Take by mouth as needed.  Marland Kitchen HYDROcodone-acetaminophen  (NORCO/VICODIN) 5-325 MG tablet Take 1 tablet by mouth every 6 (six) hours as needed for severe pain.  Marland Kitchen ibuprofen (ADVIL,MOTRIN) 200 MG tablet Take 200 mg by mouth every 6 (six) hours as needed.  Marland Kitchen lisinopril (PRINIVIL,ZESTRIL) 10 MG tablet Take 1 tablet (10 mg total) by mouth daily.  . Magnesium 250 MG TABS Take by mouth as needed.  . Pyridoxine HCl (VITAMIN B-6 PO) Take by mouth every other day.  . vitamin C (ASCORBIC ACID) 500 MG tablet Take 1 tablet (500 mg total) by mouth daily. (Patient taking differently: Take 500 mg by mouth as needed. )    BP (!) 147/82   Pulse 79   Ht 5\' 5"  (1.651 m)   Wt 162 lb (73.5 kg)   BMI 26.96 kg/m   Physical Exam Vitals signs reviewed.  Constitutional:      Appearance: She is well-developed.  Neurological:     Mental Status: She is alert and oriented to person, place, and time.  Psychiatric:        Judgment: Judgment normal.     Ortho Exam Right thumb is painful and swollen and tender over the [facility] joint with loss of motion weak  pinch it is unstable with subluxation positive grind test skin is warm dry and intact with no rash or erythema pulse temperature normal in the hand color capillary refill normal no lymphadenopathy sensation is intact there are subtle changes on the left thumb but no pain or tenderness   MEDICAL DECISION SECTION  Xrays were done at [facility]  My independent reading of xrays:  [facility] joint arthritis with 50% subluxation of the joint joint looks totally destroyed  Encounter Diagnosis  Name Primary?  . Dislocation of carpometacarpal joint of right hand, initial encounter Yes    PLAN: (Rx., injectx, surgery, frx, mri/ct) Recommend universal right thumb splint  Referral to hand surgery for definitive treatment  No orders of the defined types were placed in this encounter.   Arther Abbott, MD  11/21/2018 8:50 AM

## 2018-11-21 NOTE — Addendum Note (Signed)
Addended by: Elizabeth Sauer on: 11/21/2018 09:23 AM   Modules accepted: Orders

## 2018-11-25 ENCOUNTER — Telehealth: Payer: Self-pay | Admitting: Radiology

## 2018-11-25 NOTE — Telephone Encounter (Signed)
Called patient to see if she has heard anything from the hand center, if not will provide her the number to call, she has been referred twice, once by [place] and once by the ER.  Unable to leave message, mailbox full

## 2018-12-01 DIAGNOSIS — M1811 Unilateral primary osteoarthritis of first carpometacarpal joint, right hand: Secondary | ICD-10-CM | POA: Insufficient documentation

## 2018-12-01 DIAGNOSIS — M7701 Medial epicondylitis, right elbow: Secondary | ICD-10-CM | POA: Diagnosis not present

## 2018-12-01 DIAGNOSIS — M1812 Unilateral primary osteoarthritis of first carpometacarpal joint, left hand: Secondary | ICD-10-CM | POA: Diagnosis not present

## 2018-12-06 ENCOUNTER — Telehealth: Payer: Self-pay | Admitting: Radiology

## 2018-12-06 DIAGNOSIS — M7701 Medial epicondylitis, right elbow: Secondary | ICD-10-CM | POA: Diagnosis not present

## 2018-12-06 DIAGNOSIS — M109 Gout, unspecified: Secondary | ICD-10-CM | POA: Diagnosis not present

## 2018-12-06 DIAGNOSIS — M180 Bilateral primary osteoarthritis of first carpometacarpal joints: Secondary | ICD-10-CM | POA: Diagnosis not present

## 2018-12-06 NOTE — Telephone Encounter (Signed)
Called patient again about the referral to hand center, no answer or voice mail unable to reach about appointment scheduling  If she calls in the future, they have her information at the hand center, she just needs to call and schedule appointment I am closing referral at this time, unable to locate patient.

## 2018-12-21 ENCOUNTER — Ambulatory Visit: Payer: Medicare Other | Admitting: Orthopedic Surgery

## 2018-12-26 DIAGNOSIS — M1811 Unilateral primary osteoarthritis of first carpometacarpal joint, right hand: Secondary | ICD-10-CM | POA: Diagnosis not present

## 2018-12-26 DIAGNOSIS — M180 Bilateral primary osteoarthritis of first carpometacarpal joints: Secondary | ICD-10-CM | POA: Diagnosis not present

## 2018-12-26 DIAGNOSIS — M654 Radial styloid tenosynovitis [de Quervain]: Secondary | ICD-10-CM | POA: Diagnosis not present

## 2018-12-26 DIAGNOSIS — G8918 Other acute postprocedural pain: Secondary | ICD-10-CM | POA: Diagnosis not present

## 2018-12-28 DIAGNOSIS — M180 Bilateral primary osteoarthritis of first carpometacarpal joints: Secondary | ICD-10-CM | POA: Diagnosis not present

## 2018-12-28 DIAGNOSIS — M654 Radial styloid tenosynovitis [de Quervain]: Secondary | ICD-10-CM | POA: Diagnosis not present

## 2018-12-29 DIAGNOSIS — M1811 Unilateral primary osteoarthritis of first carpometacarpal joint, right hand: Secondary | ICD-10-CM | POA: Diagnosis not present

## 2019-01-09 DIAGNOSIS — M1811 Unilateral primary osteoarthritis of first carpometacarpal joint, right hand: Secondary | ICD-10-CM | POA: Diagnosis not present

## 2019-01-09 DIAGNOSIS — M79644 Pain in right finger(s): Secondary | ICD-10-CM | POA: Diagnosis not present

## 2019-01-16 DIAGNOSIS — Z1389 Encounter for screening for other disorder: Secondary | ICD-10-CM | POA: Diagnosis not present

## 2019-01-16 DIAGNOSIS — I10 Essential (primary) hypertension: Secondary | ICD-10-CM | POA: Diagnosis not present

## 2019-01-16 DIAGNOSIS — R739 Hyperglycemia, unspecified: Secondary | ICD-10-CM | POA: Diagnosis not present

## 2019-01-16 DIAGNOSIS — Z1331 Encounter for screening for depression: Secondary | ICD-10-CM | POA: Diagnosis not present

## 2019-01-16 DIAGNOSIS — E785 Hyperlipidemia, unspecified: Secondary | ICD-10-CM | POA: Diagnosis not present

## 2019-01-16 DIAGNOSIS — Z0001 Encounter for general adult medical examination with abnormal findings: Secondary | ICD-10-CM | POA: Diagnosis not present

## 2019-01-16 DIAGNOSIS — Z0000 Encounter for general adult medical examination without abnormal findings: Secondary | ICD-10-CM | POA: Diagnosis not present

## 2019-01-16 DIAGNOSIS — R7303 Prediabetes: Secondary | ICD-10-CM | POA: Diagnosis not present

## 2019-01-27 ENCOUNTER — Ambulatory Visit (HOSPITAL_COMMUNITY)
Admission: RE | Admit: 2019-01-27 | Discharge: 2019-01-27 | Disposition: A | Discharge status: Home / Self Care | Payer: Medicare HMO | Source: Ambulatory Visit | Attending: Internal Medicine | Admitting: Internal Medicine

## 2019-01-27 ENCOUNTER — Other Ambulatory Visit (HOSPITAL_COMMUNITY): Payer: Self-pay | Admitting: Internal Medicine

## 2019-01-27 DIAGNOSIS — M542 Cervicalgia: Secondary | ICD-10-CM | POA: Insufficient documentation

## 2019-01-27 DIAGNOSIS — I10 Essential (primary) hypertension: Secondary | ICD-10-CM | POA: Diagnosis not present

## 2019-01-31 DIAGNOSIS — M25641 Stiffness of right hand, not elsewhere classified: Secondary | ICD-10-CM | POA: Diagnosis not present

## 2019-01-31 DIAGNOSIS — M1811 Unilateral primary osteoarthritis of first carpometacarpal joint, right hand: Secondary | ICD-10-CM | POA: Diagnosis not present

## 2019-01-31 DIAGNOSIS — M1812 Unilateral primary osteoarthritis of first carpometacarpal joint, left hand: Secondary | ICD-10-CM | POA: Diagnosis not present

## 2019-01-31 DIAGNOSIS — M79644 Pain in right finger(s): Secondary | ICD-10-CM | POA: Diagnosis not present

## 2019-02-03 ENCOUNTER — Encounter: Payer: Self-pay | Admitting: Urology

## 2019-02-03 ENCOUNTER — Ambulatory Visit: Payer: Medicare HMO | Admitting: Urology

## 2019-02-07 DIAGNOSIS — Z825 Family history of asthma and other chronic lower respiratory diseases: Secondary | ICD-10-CM | POA: Diagnosis not present

## 2019-02-07 DIAGNOSIS — I951 Orthostatic hypotension: Secondary | ICD-10-CM | POA: Diagnosis not present

## 2019-02-07 DIAGNOSIS — M1990 Unspecified osteoarthritis, unspecified site: Secondary | ICD-10-CM | POA: Diagnosis not present

## 2019-02-07 DIAGNOSIS — Z888 Allergy status to other drugs, medicaments and biological substances status: Secondary | ICD-10-CM | POA: Diagnosis not present

## 2019-02-07 DIAGNOSIS — Z8249 Family history of ischemic heart disease and other diseases of the circulatory system: Secondary | ICD-10-CM | POA: Diagnosis not present

## 2019-02-07 DIAGNOSIS — G8929 Other chronic pain: Secondary | ICD-10-CM | POA: Diagnosis not present

## 2019-02-09 ENCOUNTER — Telehealth: Payer: Self-pay | Admitting: *Deleted

## 2019-02-09 DIAGNOSIS — M1009 Idiopathic gout, multiple sites: Secondary | ICD-10-CM

## 2019-02-09 NOTE — Telephone Encounter (Signed)
Received call from patient.   States that she has increased pain in joints and requested referral to rheumatology.   Referral orders placed.

## 2019-02-15 ENCOUNTER — Ambulatory Visit (INDEPENDENT_AMBULATORY_CARE_PROVIDER_SITE_OTHER): Payer: Medicare HMO

## 2019-02-15 ENCOUNTER — Other Ambulatory Visit: Payer: Self-pay

## 2019-02-15 ENCOUNTER — Ambulatory Visit: Payer: Medicare HMO | Admitting: Orthopedic Surgery

## 2019-02-15 ENCOUNTER — Encounter: Payer: Self-pay | Admitting: Orthopedic Surgery

## 2019-02-15 VITALS — BP 132/90 | HR 80 | Ht 64.0 in | Wt 154.0 lb

## 2019-02-15 DIAGNOSIS — M170 Bilateral primary osteoarthritis of knee: Secondary | ICD-10-CM

## 2019-02-15 NOTE — Progress Notes (Signed)
Progress Note   Patient ID: Megan [name], female   DOB: 29-Jul-1951, 68 y.o.   MRN: [id number]   Chief Complaint  Patient presents with  . Knee Pain    history of tibial osteotomy left     68 year old female had a right high tibial osteotomy with an opening wedge plate and screws 10 to 12 years ago and is also followed for osteoarthritis left knee  Comes in today for routine x-rays.  She says she has no pain in her knees except if it rains or gets cold    Review of Systems  Musculoskeletal: Positive for neck pain.       Status post right thumb surgery for OA doing well except for some stiffness     Allergies  Allergen Reactions  . Other Other (See Comments)    Nuts - pt states sometimes makes her gout flare up  . Hydrocodone     Makes her feel bad  . Naproxen     Makes her feel bad      BP 132/90   Pulse 80   Ht 5\' 4"  (1.626 m)   Wt 154 lb (69.9 kg)   BMI 26.43 kg/m   Physical Exam Vitals signs and nursing note reviewed.  Constitutional:      Appearance: Normal appearance.  Neurological:     Mental Status: She is alert and oriented to person, place, and time.  Psychiatric:        Mood and Affect: Mood normal.    Right knee she has excellent range of motion in the right knee surgical incision healed well. Knee is stable  Left knee mild varus to the left knee No tenderness over the medial joint line no effusion Range of motion 130 degrees    Medical decisions:   Data  Imaging:   See dictated reports well-functioning HTO with internal fixation right knee  Left knee mild medial compartment arthrosis   Encounter Diagnosis  Name Primary?  . Primary osteoarthritis of both knees Yes    PLAN:   X-ray in 1 year both knees    Arther Abbott, MD 02/15/2019 10:48 AM

## 2019-02-20 ENCOUNTER — Encounter: Payer: Medicare Other | Admitting: Family Medicine

## 2019-02-28 DIAGNOSIS — M1812 Unilateral primary osteoarthritis of first carpometacarpal joint, left hand: Secondary | ICD-10-CM | POA: Diagnosis not present

## 2019-02-28 DIAGNOSIS — M25641 Stiffness of right hand, not elsewhere classified: Secondary | ICD-10-CM | POA: Diagnosis not present

## 2019-02-28 DIAGNOSIS — M1811 Unilateral primary osteoarthritis of first carpometacarpal joint, right hand: Secondary | ICD-10-CM | POA: Diagnosis not present

## 2019-02-28 DIAGNOSIS — M7701 Medial epicondylitis, right elbow: Secondary | ICD-10-CM | POA: Diagnosis not present

## 2019-03-28 DIAGNOSIS — R5382 Chronic fatigue, unspecified: Secondary | ICD-10-CM | POA: Diagnosis not present

## 2019-03-28 DIAGNOSIS — M7989 Other specified soft tissue disorders: Secondary | ICD-10-CM | POA: Diagnosis not present

## 2019-03-28 DIAGNOSIS — E663 Overweight: Secondary | ICD-10-CM | POA: Diagnosis not present

## 2019-03-28 DIAGNOSIS — M2550 Pain in unspecified joint: Secondary | ICD-10-CM | POA: Diagnosis not present

## 2019-03-28 DIAGNOSIS — Z6826 Body mass index (BMI) 26.0-26.9, adult: Secondary | ICD-10-CM | POA: Diagnosis not present

## 2019-05-02 DIAGNOSIS — M7989 Other specified soft tissue disorders: Secondary | ICD-10-CM | POA: Diagnosis not present

## 2019-05-02 DIAGNOSIS — M2550 Pain in unspecified joint: Secondary | ICD-10-CM | POA: Diagnosis not present

## 2019-05-02 DIAGNOSIS — R69 Illness, unspecified: Secondary | ICD-10-CM | POA: Diagnosis not present

## 2019-05-02 DIAGNOSIS — R5382 Chronic fatigue, unspecified: Secondary | ICD-10-CM | POA: Diagnosis not present

## 2019-05-02 DIAGNOSIS — M0609 Rheumatoid arthritis without rheumatoid factor, multiple sites: Secondary | ICD-10-CM | POA: Diagnosis not present

## 2019-05-09 ENCOUNTER — Other Ambulatory Visit: Payer: Self-pay

## 2019-05-09 ENCOUNTER — Encounter: Payer: Self-pay | Admitting: Family Medicine

## 2019-05-09 ENCOUNTER — Ambulatory Visit (INDEPENDENT_AMBULATORY_CARE_PROVIDER_SITE_OTHER): Payer: Medicare HMO | Admitting: Family Medicine

## 2019-05-09 DIAGNOSIS — M1990 Unspecified osteoarthritis, unspecified site: Secondary | ICD-10-CM

## 2019-05-09 DIAGNOSIS — M069 Rheumatoid arthritis, unspecified: Secondary | ICD-10-CM | POA: Insufficient documentation

## 2019-05-09 MED ORDER — METHOTREXATE (PF) 25 MG/0.4ML ~~LOC~~ SOAJ: SUBCUTANEOUS | 1 refills | Status: DC

## 2019-05-09 MED ORDER — FOLIC ACID 1 MG PO TABS: 1.0000 mg | ORAL_TABLET | Freq: Every day | ORAL | 3 refills | Status: DC

## 2019-05-09 NOTE — Progress Notes (Signed)
   Subjective:    Patient ID: Megan [name], female    DOB: December 08, 1950, 68 y.o.   MRN: [id number]  Patient presents for Hypertension  Pt here to f/u chronic medical problems Would like for me to review her last rheumatology note and give the okay for her to take the cetraxate.  She also had some over-the-counter supplements for inflammation want to know if this would help.  She takes celery root she had vitamin K and some other herbal.  States that she also drinks herbal teas.  Viewed rheumatology note.  Her uric acid was normal her labs did show elevated inflammatory markers consistent with inflammatory arthritis.  She still has pain and swelling in her foot as well as her hands.  Seen by Ortho in In March-    Review Of Systems:  GEN- denies fatigue, fever, weight loss,weakness, recent illness HEENT- denies eye drainage, change in vision, nasal discharge, CVS- denies chest pain, palpitations RESP- denies SOB, cough, wheeze ABD- denies N/V, change in stools, abd pain GU- denies dysuria, hematuria, dribbling, incontinence MSK-+joint pain, muscle aches, injury Neuro- denies headache, dizziness, syncope, seizure activity       Objective:    BP 118/78   Pulse 70   Temp 98.2 F (36.8 C) (Oral)   Resp 15   Ht 5\' 5"  (1.651 m)   Wt 160 lb 8 oz (72.8 kg)   SpO2 97%   BMI 26.71 kg/m  GEN- NAD, alert and oriented x3 HEENT- PERRL, EOMI, non injected sclera, pink conjunctiva, MMM, oropharynx clear CVS- RRR, no murmur RESP-CTAB EXT-swelling bilateral ankles, mild swelling in the base of her left thumb  pulses- Radial, DP- 2+        Assessment & Plan:      Problem List Items Addressed This Visit      Unprioritized   Inflammatory arthritis    After review of the notes I do recommend that she try the methotrexate to help with inflammation and pain.  I do not think this is gout based on the appearance no improvement with previous gout medications normal uric acid.  She is willing  to try the methotrexate injection.  We will also take this with folic acid.  I think she can take the celery root and drink herbal tea but she does not need the vitamin K supplement or the other herbal that she has here in the office.      Relevant Medications   Methotrexate, PF, 25 MG/0.4ML SOAJ      Note: This dictation was prepared with Dragon dictation along with smaller phrase technology. Any transcriptional errors that result from this process are unintentional.

## 2019-05-09 NOTE — Patient Instructions (Addendum)
Okay to take the methotrexate  Take the folic acid  F/U 3-4 months for Physical

## 2019-05-09 NOTE — Assessment & Plan Note (Signed)
After review of the notes I do recommend that she try the methotrexate to help with inflammation and pain.  I do not think this is gout based on the appearance no improvement with previous gout medications normal uric acid.  She is willing to try the methotrexate injection.  We will also take this with folic acid.  I think she can take the celery root and drink herbal tea but she does not need the vitamin K supplement or the other herbal that she has here in the office.

## 2019-05-30 ENCOUNTER — Ambulatory Visit (INDEPENDENT_AMBULATORY_CARE_PROVIDER_SITE_OTHER): Payer: Medicare HMO | Admitting: Family Medicine

## 2019-05-30 ENCOUNTER — Other Ambulatory Visit: Payer: Self-pay

## 2019-05-30 ENCOUNTER — Encounter: Payer: Self-pay | Admitting: Family Medicine

## 2019-05-30 VITALS — BP 112/68 | HR 86 | Temp 98.3°F | Resp 16 | Ht 65.0 in | Wt 160.0 lb

## 2019-05-30 DIAGNOSIS — M1990 Unspecified osteoarthritis, unspecified site: Secondary | ICD-10-CM

## 2019-05-30 DIAGNOSIS — M5136 Other intervertebral disc degeneration, lumbar region: Secondary | ICD-10-CM

## 2019-05-30 NOTE — Patient Instructions (Addendum)
Try the folic acid every other day  Continue the methotrexate  F/U as needed

## 2019-05-30 NOTE — Progress Notes (Signed)
   Subjective:    Patient ID: Megan [name], female    DOB: 1951/05/04, 68 y.o.   MRN: [id number]  Patient presents for Lower Back Pain (x1 week- states that she had L sided back pain that radiated to R side- used pain meds, pain patches, and magnsium- reports that back is improved)  Had back pain across the pain, had some side pain, in the past has had flares around the same time each year. No change in bowel or bladder, she was working out in her garden pulling bags of mulch.  no tingling or numbness in feet or legs   Tried magnesium and pain patch - OTC inflammatory patch   Ibuprofen a few days then stopped then tried aleve   She is currently on the methotrexate , states folic acid   Back pain has resolved now     Review Of Systems:  GEN- denies fatigue, fever, weight loss,weakness, recent illness HEENT- denies eye drainage, change in vision, nasal discharge, CVS- denies chest pain, palpitations RESP- denies SOB, cough, wheeze ABD- denies N/V, change in stools, abd pain GU- denies dysuria, hematuria, dribbling, incontinence MSK- + joint pain, muscle aches, injury Neuro- denies headache, dizziness, syncope, seizure activity       Objective:    BP 112/68   Pulse 86   Temp 98.3 F (36.8 C) (Oral)   Resp 16   Ht 5\' 5"  (1.651 m)   Wt 160 lb (72.6 kg)   SpO2 99%   BMI 26.63 kg/m  GEN- NAD, alert and oriented x3 HEENT- PERRL, EOMI, non injected sclera, pink conjunctiva, MMM, oropharynx clear CVS- RRR, no murmur RESP-CTAB ABD-NABS,soft,NT,ND MSK- Mild TTP lumbar paraspinals, spine NT, fair ROM back, HIPS, neg SLR Neuro- normal tone LE, sensation in tact, strength equal bilat, normal gait  EXT- No edema Pulses- Radial, DP- 2+        Assessment & Plan:      Problem List Items Addressed This Visit      Unprioritized   DDD (degenerative disc disease), lumbar    No inflammatory arthritis she is on autoimmune drug.  She has no degenerative disc disease and mild  thoracic lumbar scoliosis which I showed her on the x-rays from years back.  There are no new red flags her pain is actually already improved.  This is likely just inflammation from moving the mulch bags and things that she was doing over the weekend.  At this time she can continue her regular medication she can use ibuprofen the topical patches when it flares.  Follow-up with her rheumatologist.  Will not repeat any imaging at this time.  She will call back if any changes in her symptoms      Inflammatory arthritis - Primary    Note she also said that the folic acid made her feel good when she takes it every day she thought it was making her head hurt.  This is very unclear.  Advised her to take it every other day and if she still has a problem with the folic acid to call her rheumatologist.  I am never seen any significant headaches with folic acid         Note: This dictation was prepared with Dragon dictation along with smaller phrase technology. Any transcriptional errors that result from this process are unintentional.

## 2019-05-31 ENCOUNTER — Encounter: Payer: Self-pay | Admitting: Family Medicine

## 2019-05-31 NOTE — Assessment & Plan Note (Signed)
No inflammatory arthritis she is on autoimmune drug.  She has no degenerative disc disease and mild thoracic lumbar scoliosis which I showed her on the x-rays from years back.  There are no new red flags her pain is actually already improved.  This is likely just inflammation from moving the mulch bags and things that she was doing over the weekend.  At this time she can continue her regular medication she can use ibuprofen the topical patches when it flares.  Follow-up with her rheumatologist.  Will not repeat any imaging at this time.  She will call back if any changes in her symptoms

## 2019-05-31 NOTE — Assessment & Plan Note (Signed)
Note she also said that the folic acid made her feel good when she takes it every day she thought it was making her head hurt.  This is very unclear.  Advised her to take it every other day and if she still has a problem with the folic acid to call her rheumatologist.  I am never seen any significant headaches with folic acid

## 2019-06-06 ENCOUNTER — Telehealth: Payer: Self-pay | Admitting: Family Medicine

## 2019-06-06 DIAGNOSIS — R69 Illness, unspecified: Secondary | ICD-10-CM | POA: Diagnosis not present

## 2019-06-06 NOTE — Telephone Encounter (Signed)
Patient called in stating that she has been experiencing right lower quadrant pain since in the early hours of this morning. She has tried otc gas medications, green tea, and vinegar with no relief. Patient states that since the onset of the pain she has been doubled over and unable to stand straight due to pain. Advised patient to go to the ER, or Urgent care asap for  Further evaluation. Patient verbalized understanding.

## 2019-06-07 ENCOUNTER — Telehealth: Payer: Self-pay | Admitting: *Deleted

## 2019-06-07 NOTE — Telephone Encounter (Signed)
Received call from patient.   Reports that she was seen at [facility] in [place] for abd pain. States that she was given Nabumetone 500mg . States that she is to take medication BID. Reports that it has improved pain slightly.   States that she was informed at [facility] that pain may be related to cyst on kidney. Inquired as to if she requires CT Scan to F/U cyst. Advised to use medication as directed. If pain continues, F/U with PCP. Reports that she would like to schedule appt with PCP to F/U next week. Appointment scheduled. Records requested from [facility].

## 2019-06-13 ENCOUNTER — Ambulatory Visit: Payer: Medicare HMO | Admitting: Family Medicine

## 2019-06-13 DIAGNOSIS — M0609 Rheumatoid arthritis without rheumatoid factor, multiple sites: Secondary | ICD-10-CM | POA: Diagnosis not present

## 2019-06-13 DIAGNOSIS — M7989 Other specified soft tissue disorders: Secondary | ICD-10-CM | POA: Diagnosis not present

## 2019-06-13 DIAGNOSIS — M2550 Pain in unspecified joint: Secondary | ICD-10-CM | POA: Diagnosis not present

## 2019-06-13 DIAGNOSIS — E663 Overweight: Secondary | ICD-10-CM | POA: Diagnosis not present

## 2019-06-13 DIAGNOSIS — R5382 Chronic fatigue, unspecified: Secondary | ICD-10-CM | POA: Diagnosis not present

## 2019-06-13 DIAGNOSIS — Z6826 Body mass index (BMI) 26.0-26.9, adult: Secondary | ICD-10-CM | POA: Diagnosis not present

## 2019-06-20 ENCOUNTER — Encounter: Payer: Self-pay | Admitting: Gastroenterology

## 2019-07-04 ENCOUNTER — Other Ambulatory Visit (HOSPITAL_COMMUNITY): Payer: Self-pay | Admitting: Preventative Medicine

## 2019-07-04 ENCOUNTER — Other Ambulatory Visit: Payer: Self-pay | Admitting: Preventative Medicine

## 2019-07-04 ENCOUNTER — Ambulatory Visit: Payer: Medicare HMO | Admitting: Family Medicine

## 2019-07-04 ENCOUNTER — Other Ambulatory Visit: Payer: Self-pay

## 2019-07-04 DIAGNOSIS — R1031 Right lower quadrant pain: Secondary | ICD-10-CM | POA: Diagnosis not present

## 2019-07-04 DIAGNOSIS — N281 Cyst of kidney, acquired: Secondary | ICD-10-CM | POA: Diagnosis not present

## 2019-07-04 DIAGNOSIS — Z87448 Personal history of other diseases of urinary system: Secondary | ICD-10-CM

## 2019-07-05 ENCOUNTER — Ambulatory Visit: Payer: Medicare HMO | Admitting: Family Medicine

## 2019-07-06 ENCOUNTER — Other Ambulatory Visit: Payer: Self-pay

## 2019-07-06 ENCOUNTER — Ambulatory Visit (HOSPITAL_COMMUNITY)
Admission: RE | Admit: 2019-07-06 | Discharge: 2019-07-06 | Disposition: A | Discharge status: Home / Self Care | Payer: Medicare HMO | Source: Ambulatory Visit | Attending: Preventative Medicine | Admitting: Preventative Medicine

## 2019-07-06 ENCOUNTER — Other Ambulatory Visit (HOSPITAL_COMMUNITY)
Admission: RE | Admit: 2019-07-06 | Discharge: 2019-07-06 | Disposition: A | Discharge status: Home / Self Care | Payer: Medicare HMO | Source: Ambulatory Visit | Attending: Preventative Medicine | Admitting: Preventative Medicine

## 2019-07-06 DIAGNOSIS — R1031 Right lower quadrant pain: Secondary | ICD-10-CM | POA: Insufficient documentation

## 2019-07-06 DIAGNOSIS — Z01812 Encounter for preprocedural laboratory examination: Secondary | ICD-10-CM | POA: Insufficient documentation

## 2019-07-06 DIAGNOSIS — N281 Cyst of kidney, acquired: Secondary | ICD-10-CM | POA: Diagnosis not present

## 2019-07-06 DIAGNOSIS — Z87448 Personal history of other diseases of urinary system: Secondary | ICD-10-CM | POA: Insufficient documentation

## 2019-07-06 LAB — CREATININE, SERUM
Creatinine, Ser: 0.62 mg/dL (ref 0.44–1.00)
GFR calc Af Amer: >60 mL/min (ref >60)
GFR calc non Af Amer: >60 mL/min (ref >60)

## 2019-07-06 LAB — BUN: BUN: 13 mg/dL (ref 8–23)

## 2019-07-06 MED ORDER — IOHEXOL 300 MG/ML  SOLN
100.0000 mL | Freq: Once | INTRAMUSCULAR | Status: AC | PRN
  Administered 2019-07-06: 100 mL via INTRAVENOUS

## 2019-07-10 DIAGNOSIS — N281 Cyst of kidney, acquired: Secondary | ICD-10-CM | POA: Diagnosis not present

## 2019-07-10 DIAGNOSIS — N2889 Other specified disorders of kidney and ureter: Secondary | ICD-10-CM | POA: Diagnosis not present

## 2019-07-13 ENCOUNTER — Other Ambulatory Visit: Payer: Self-pay

## 2019-07-14 ENCOUNTER — Ambulatory Visit (INDEPENDENT_AMBULATORY_CARE_PROVIDER_SITE_OTHER): Payer: Medicare HMO | Admitting: Family Medicine

## 2019-07-14 ENCOUNTER — Encounter: Payer: Self-pay | Admitting: Family Medicine

## 2019-07-14 VITALS — BP 120/62 | HR 80 | Temp 98.4°F | Resp 14 | Ht 65.0 in | Wt 160.0 lb

## 2019-07-14 DIAGNOSIS — M1990 Unspecified osteoarthritis, unspecified site: Secondary | ICD-10-CM | POA: Diagnosis not present

## 2019-07-14 DIAGNOSIS — N281 Cyst of kidney, acquired: Secondary | ICD-10-CM | POA: Diagnosis not present

## 2019-07-14 DIAGNOSIS — K219 Gastro-esophageal reflux disease without esophagitis: Secondary | ICD-10-CM | POA: Diagnosis not present

## 2019-07-14 NOTE — Patient Instructions (Addendum)
Call your rheumatology about something new  Try Tums OR pepcid  [facility] [phone] F/U as previous

## 2019-07-14 NOTE — Progress Notes (Signed)
   Subjective:    Patient ID: Megan [name], female    DOB: 02/03/1951, 68 y.o.   MRN: [id number]  Patient presents for Follow-up ([facility])   Stopped methotrexate and folic acid felt she was having side effects, has not called her rheumatologist but then ended up at [facility] with right side pain, abd discomfort. Given Nabumetone after CT showed old renal cyst, also showed fluid around esophogus concerning for reflux which she admits she has, often feels at night. No vomiting After 2 days on NSAIDS pain resolved. Felt to be more MSK or her inflammatory arthritis  Today wants to review scans and discuss next steps       Review Of Systems:  GEN- denies fatigue, fever, weight loss,weakness, recent illness HEENT- denies eye drainage, change in vision, nasal discharge, CVS- denies chest pain, palpitations RESP- denies SOB, cough, wheeze ABD- denies N/V, change in stools, abd pain GU- denies dysuria, hematuria, dribbling, incontinence MSK- + joint pain, muscle aches, injury Neuro- denies headache, dizziness, syncope, seizure activity       Objective:    BP 120/62   Pulse 80   Temp 98.4 F (36.9 C) (Oral)   Resp 14   Ht 5\' 5"  (1.651 m)   Wt 160 lb (72.6 kg)   SpO2 98%   BMI 26.63 kg/m  GEN- NAD, alert and oriented x3 HEENT- PERRL, EOMI, non injected sclera, pink conjunctiva, MMM, oropharynx clear CVS- RRR, no murmur RESP-CTAB ABD-NABS,soft,NT,ND EXT- No edema Pulses- Radial 2+        Assessment & Plan:      Problem List Items Addressed This Visit      Unprioritized   Acquired renal cyst of right kidney    Reviewed 2018 evaluation with her [facility] has already contacted her, she will call back for the appt  No urinary symptoms at this time      GERD (gastroesophageal reflux disease)    Recommend she try TUMS or pepcid does not want daily med Also discussed trigger foods  No unintentional weight loss       Inflammatory arthritis - Primary    Recommend again she  call her rheumatologist for another option to control her joint pain         Note: This dictation was prepared with Dragon dictation along with smaller phrase technology. Any transcriptional errors that result from this process are unintentional.

## 2019-07-16 ENCOUNTER — Encounter: Payer: Self-pay | Admitting: Family Medicine

## 2019-07-16 NOTE — Assessment & Plan Note (Signed)
Recommend again she call her rheumatologist for another option to control her joint pain

## 2019-07-16 NOTE — Assessment & Plan Note (Signed)
Reviewed 2018 evaluation with her [facility] has already contacted her, she will call back for the appt  No urinary symptoms at this time

## 2019-07-16 NOTE — Assessment & Plan Note (Signed)
Recommend she try TUMS or pepcid does not want daily med Also discussed trigger foods  No unintentional weight loss

## 2019-08-01 DIAGNOSIS — M2550 Pain in unspecified joint: Secondary | ICD-10-CM | POA: Diagnosis not present

## 2019-08-01 DIAGNOSIS — R5382 Chronic fatigue, unspecified: Secondary | ICD-10-CM | POA: Diagnosis not present

## 2019-08-01 DIAGNOSIS — E663 Overweight: Secondary | ICD-10-CM | POA: Diagnosis not present

## 2019-08-01 DIAGNOSIS — Z6826 Body mass index (BMI) 26.0-26.9, adult: Secondary | ICD-10-CM | POA: Diagnosis not present

## 2019-08-01 DIAGNOSIS — M0609 Rheumatoid arthritis without rheumatoid factor, multiple sites: Secondary | ICD-10-CM | POA: Diagnosis not present

## 2019-08-01 DIAGNOSIS — M7989 Other specified soft tissue disorders: Secondary | ICD-10-CM | POA: Diagnosis not present

## 2019-08-04 DIAGNOSIS — R69 Illness, unspecified: Secondary | ICD-10-CM | POA: Diagnosis not present

## 2019-08-15 DIAGNOSIS — N281 Cyst of kidney, acquired: Secondary | ICD-10-CM | POA: Diagnosis not present

## 2019-08-15 DIAGNOSIS — R351 Nocturia: Secondary | ICD-10-CM | POA: Diagnosis not present

## 2019-09-11 ENCOUNTER — Other Ambulatory Visit: Payer: Self-pay

## 2019-09-11 ENCOUNTER — Encounter: Payer: Self-pay | Admitting: Family Medicine

## 2019-09-11 ENCOUNTER — Ambulatory Visit (INDEPENDENT_AMBULATORY_CARE_PROVIDER_SITE_OTHER): Payer: Medicare HMO | Admitting: Family Medicine

## 2019-09-11 VITALS — BP 134/80 | HR 64 | Temp 98.5°F | Resp 16 | Ht 65.0 in | Wt 161.0 lb

## 2019-09-11 DIAGNOSIS — E039 Hypothyroidism, unspecified: Secondary | ICD-10-CM | POA: Diagnosis not present

## 2019-09-11 DIAGNOSIS — R7303 Prediabetes: Secondary | ICD-10-CM

## 2019-09-11 DIAGNOSIS — D509 Iron deficiency anemia, unspecified: Secondary | ICD-10-CM | POA: Diagnosis not present

## 2019-09-11 DIAGNOSIS — Z0000 Encounter for general adult medical examination without abnormal findings: Secondary | ICD-10-CM | POA: Diagnosis not present

## 2019-09-11 DIAGNOSIS — I10 Essential (primary) hypertension: Secondary | ICD-10-CM

## 2019-09-11 DIAGNOSIS — Z1159 Encounter for screening for other viral diseases: Secondary | ICD-10-CM

## 2019-09-11 DIAGNOSIS — E7800 Pure hypercholesterolemia, unspecified: Secondary | ICD-10-CM

## 2019-09-11 NOTE — Patient Instructions (Addendum)
I recommend eye visit once a year I recommend dental visit every 6 months Goal is to  Exercise 30 minutes 5 days a week We will call with lab results  F/U 6 months

## 2019-09-11 NOTE — Progress Notes (Signed)
Subjective:   Patient presents for Medicare Annual/Subsequent preventive examination.     Rheumatoid arthriitis- on methotrexate shots, she is not on  folic acid , she plans to get it over the counter   she has follow up in November   She is using magnesium- using for sleep and and bowels 2-3 times a week   Vitamin C / B 12/ vitamin D 1000IU   Iron def anemia- takes daily    Review Past Medical/Family/Social: Per EMR   Risk Factors  Current exercise habits: She stays active  Dietary issues discussed: No major concerns trying to watch her fat intake and her sodium  Cardiac risk factors: Hypertension  Depression Screen  (Note: if answer to either of the following is "Yes", a more complete depression screening is indicated)  Over the past two weeks, have you felt down, depressed or hopeless? No Over the past two weeks, have you felt little interest or pleasure in doing things? No Have you lost interest or pleasure in daily life? No Do you often feel hopeless? No Do you cry easily over simple problems? No   Activities of Daily Living  In your present state of health, do you have any difficulty performing the following activities?:  Driving? No  Managing money? No  Feeding yourself? No  Getting from bed to chair? No  Climbing a flight of stairs? No  Preparing food and eating?: No  Bathing or showering? No  Getting dressed: No  Getting to the toilet? No  Using the toilet:No  Moving around from place to place: No  In the past year have you fallen or had a near fall?:No  Are you sexually active? No  Do you have more than one partner? No   Hearing Difficulties: No  Do you often ask people to speak up or repeat themselves? No  Do you experience ringing or noises in your ears? No Do you have difficulty understanding soft or whispered voices? No  Do you feel that you have a problem with memory? No Do you often misplace items? No  Do you feel safe at home? Yes  Cognitive  Testing  Alert? Yes Normal Appearance?Yes  Oriented to person? Yes Place? Yes  Time? Yes  Recall of three objects? Yes  Can perform simple calculations? Yes  Displays appropriate judgment?Yes  Can read the correct time from a watch face?Yes   List the Names of Other Physician/Practitioners you currently use:  Eye doctor- [facility] - Dr. Burney Gauze     Screening Tests / Date Colonoscopy - done in 2015                    Zostavax - Declines  Bone density- UTD  Mammogram - Due in December  Influenza Vaccine  Declines  Tetanus/tdap UTD    ROS: GEN- denies fatigue, fever, weight loss,weakness, recent illness HEENT- denies eye drainage, change in vision, nasal discharge, CVS- denies chest pain, palpitations RESP- denies SOB, cough, wheeze ABD- denies N/V, change in stools, abd pain GU- denies dysuria, hematuria, dribbling, incontinence MSK- denies joint pain, muscle aches, injury Neuro- denies headache, dizziness, syncope, seizure activity  Physical: GEN- NAD, alert and oriented x3 HEENT- PERRL, EOMI, non injected sclera, pink conjunctiva, MMM, oropharynx clear Neck- Supple, no thryomegaly CVS- RRR, no murmur RESP-CTAB ABD-NABS,SOFT,ND,NT EXT- No edema Pulses- Radial, DP- 2+   Assessment:    Annual wellness medicare exam   Plan:    During the course of the visit the  patient was educated and counseled about appropriate screening and preventive services including:  Screening mammography -patient will schedule in December when her mammogram is due. She declines immunizations. History of iron deficiency anemia we will recheck her iron stores. Hypertension blood pressures controlled. History of borderline diabetes mellitus we will recheck an A1c. Rheumatoid arthritis she is followed by rheumatology she is on methotrexate discussed that she should get the folic acid over-the-counter she did not like the prescription count.    History of abnormal  TSH- thyroid disorder- recheck TSH    Fall.AUDIT C/Depression screen neg  Discussed advanced directives- given handout        Diet review for nutrition referral? Yes ____ Not Indicated __x__  Patient Instructions (the written plan) was given to the patient.  Medicare Attestation  I have personally reviewed:  The patient's medical and social history  Their use of alcohol, tobacco or illicit drugs  Their current medications and supplements  The patient's functional ability including ADLs,fall risks, home safety risks, cognitive, and hearing and visual impairment  Diet and physical activities  Evidence for depression or mood disorders  The patient's weight, height, BMI, and visual acuity have been recorded in the chart. I have made referrals, counseling, and provided education to the patient based on review of the above and I have provided the patient with a written personalized care plan for preventive services.

## 2019-09-12 LAB — CBC WITH DIFFERENTIAL/PLATELET
Absolute Monocytes: 315 cells/uL (ref 200–950)
Basophils Absolute: 21 cells/uL (ref 0–200)
Basophils Relative: 0.7 %
Eosinophils Absolute: 168 cells/uL (ref 15–500)
Eosinophils Relative: 5.6 %
HCT: 35.1 % (ref 35.0–45.0)
Hemoglobin: 11.6 g/dL — ABNORMAL LOW (ref 11.7–15.5)
Lymphs Abs: 957 cells/uL (ref 850–3900)
MCH: 28.9 pg (ref 27.0–33.0)
MCHC: 33 g/dL (ref 32.0–36.0)
MCV: 87.5 fL (ref 80.0–100.0)
MPV: 11.6 fL (ref 7.5–12.5)
Monocytes Relative: 10.5 %
Neutro Abs: 1539 cells/uL (ref 1500–7800)
Neutrophils Relative %: 51.3 %
Platelets: 246 10*3/uL (ref 140–400)
RBC: 4.01 10*6/uL (ref 3.80–5.10)
RDW: 14.5 % (ref 11.0–15.0)
Total Lymphocyte: 31.9 %
WBC: 3 10*3/uL — ABNORMAL LOW (ref 3.8–10.8)

## 2019-09-12 LAB — HEMOGLOBIN A1C
Hgb A1c MFr Bld: 5.7 % of total Hgb — ABNORMAL HIGH (ref <5.7)
Mean Plasma Glucose: 117 (calc)
eAG (mmol/L): 6.5 (calc)

## 2019-09-12 LAB — COMPREHENSIVE METABOLIC PANEL
AG Ratio: 1.6 (calc) (ref 1.0–2.5)
ALT: 16 U/L (ref 6–29)
AST: 21 U/L (ref 10–35)
Albumin: 4.1 g/dL (ref 3.6–5.1)
Alkaline phosphatase (APISO): 82 U/L (ref 37–153)
BUN: 10 mg/dL (ref 7–25)
CO2: 27 mmol/L (ref 20–32)
Calcium: 10.3 mg/dL (ref 8.6–10.4)
Chloride: 103 mmol/L (ref 98–110)
Creat: 0.72 mg/dL (ref 0.50–0.99)
Globulin: 2.6 g/dL (calc) (ref 1.9–3.7)
Glucose, Bld: 82 mg/dL (ref 65–99)
Potassium: 4.2 mmol/L (ref 3.5–5.3)
Sodium: 140 mmol/L (ref 135–146)
Total Bilirubin: 0.3 mg/dL (ref 0.2–1.2)
Total Protein: 6.7 g/dL (ref 6.1–8.1)

## 2019-09-12 LAB — IRON,TIBC AND FERRITIN PANEL
%SAT: 21 % (calc) (ref 16–45)
Ferritin: 193 ng/mL (ref 16–288)
Iron: 62 ug/dL (ref 45–160)
TIBC: 293 mcg/dL (calc) (ref 250–450)

## 2019-09-12 LAB — LIPID PANEL
Cholesterol: 214 mg/dL — ABNORMAL HIGH (ref <200)
HDL: 74 mg/dL (ref >=50)
LDL Cholesterol (Calc): 126 mg/dL (calc) — ABNORMAL HIGH
Non-HDL Cholesterol (Calc): 140 mg/dL (calc) — ABNORMAL HIGH (ref <130)
Total CHOL/HDL Ratio: 2.9 (calc) (ref <5.0)
Triglycerides: 58 mg/dL (ref <150)

## 2019-09-12 LAB — TSH: TSH: 1.52 mIU/L (ref 0.40–4.50)

## 2019-09-12 LAB — HEPATITIS C ANTIBODY
Hepatitis C Ab: NONREACTIVE
SIGNAL TO CUT-OFF: 0.01 (ref <1.00)

## 2019-09-14 ENCOUNTER — Encounter: Payer: Self-pay | Admitting: *Deleted

## 2019-09-30 DIAGNOSIS — H524 Presbyopia: Secondary | ICD-10-CM | POA: Diagnosis not present

## 2019-09-30 DIAGNOSIS — Z0100 Encounter for examination of eyes and vision without abnormal findings: Secondary | ICD-10-CM | POA: Diagnosis not present

## 2019-10-09 ENCOUNTER — Other Ambulatory Visit: Payer: Self-pay

## 2019-10-09 ENCOUNTER — Encounter: Payer: Self-pay | Admitting: Family Medicine

## 2019-10-09 ENCOUNTER — Ambulatory Visit (INDEPENDENT_AMBULATORY_CARE_PROVIDER_SITE_OTHER): Payer: Medicare HMO | Admitting: Family Medicine

## 2019-10-09 DIAGNOSIS — R42 Dizziness and giddiness: Secondary | ICD-10-CM | POA: Diagnosis not present

## 2019-10-09 DIAGNOSIS — R030 Elevated blood-pressure reading, without diagnosis of hypertension: Secondary | ICD-10-CM

## 2019-10-09 DIAGNOSIS — E7800 Pure hypercholesterolemia, unspecified: Secondary | ICD-10-CM

## 2019-10-09 MED ORDER — ATORVASTATIN CALCIUM 10 MG PO TABS: 10.0000 mg | ORAL_TABLET | Freq: Every day | ORAL | 3 refills | Status: DC

## 2019-10-09 NOTE — Progress Notes (Signed)
Virtual Visit via Telephone Note  I connected with Megan [name] on 10/09/19 at 3:23pm by telephone and verified that I am speaking with the correct person using two identifiers.      Pt location: at home   Physician location:  In office, [facility], Vic Blackbird MD     On call: patient and physician   I discussed the limitations, risks, security and privacy concerns of performing an evaluation and management service by telephone and the availability of in person appointments. I also discussed with the patient that there may be a patient responsible charge related to this service. The patient expressed understanding and agreed to proceed.   History of Present Illness: Friday night had fish from a local restaurant, her BP was elevated Sat 163/ 80's. She very dizzy in the middle of the night and that is when she checked her BP.  She took vinegar and water, then took an old lisinopril 10mg  . She had 1 episode of diarrhea, no vomiting, She took a cholesterol pill as well, thinking this was the cause of her symptoms due to cholesterol buildup  BP later that day 114/70 BP today was 104/67 She admits that she has been stressed out recently trying to find a job, and that may have contributed as well   No cough, congestion, no HA , no fever, no CP, no SOB She still has some mild dizziness, she took ginger which helped  Feels well today She is starting a new job at the end of the week     Observations/Objective: NAD noted on phone  Assessment and Plan: Elevated BP-blood pressure likely came from eating at the restaurant where she became sick.  Her hypertension is now resolved.  Advised that she does not need to continue the lisinopril she should go and call if more hypotensive and worsening dizziness.  She is now eating and drinking at her baseline feels well otherwise.  She does want a go ahead and restart her cholesterol medication she did have recent labs that showed elevated  LDL.  She is can restart Lipitor 10 mg once a day at bedtime.  Advised if she has recurrent symptoms any cough congestion fever that she should go ahead and get Covid testing as she will be starting a new job but I do not think she needs it at this time.  Follow Up Instructions:    I discussed the assessment and treatment plan with the patient. The patient was provided an opportunity to ask questions and all were answered. The patient agreed with the plan and demonstrated an understanding of the instructions.   The patient was advised to call back or seek an in-person evaluation if the symptoms worsen or if the condition fails to improve as anticipated.  I provided 43minutes of non-face-to-face time during this encounter. End time 3:36pm  Vic Blackbird, MD

## 2019-10-25 DIAGNOSIS — R5382 Chronic fatigue, unspecified: Secondary | ICD-10-CM | POA: Diagnosis not present

## 2019-10-25 DIAGNOSIS — M0609 Rheumatoid arthritis without rheumatoid factor, multiple sites: Secondary | ICD-10-CM | POA: Diagnosis not present

## 2019-10-25 DIAGNOSIS — M2550 Pain in unspecified joint: Secondary | ICD-10-CM | POA: Diagnosis not present

## 2019-10-25 DIAGNOSIS — M7989 Other specified soft tissue disorders: Secondary | ICD-10-CM | POA: Diagnosis not present

## 2019-10-25 DIAGNOSIS — Z6827 Body mass index (BMI) 27.0-27.9, adult: Secondary | ICD-10-CM | POA: Diagnosis not present

## 2019-10-25 DIAGNOSIS — E663 Overweight: Secondary | ICD-10-CM | POA: Diagnosis not present

## 2020-01-24 ENCOUNTER — Other Ambulatory Visit (HOSPITAL_COMMUNITY): Payer: Self-pay | Admitting: Family Medicine

## 2020-01-24 DIAGNOSIS — Z1231 Encounter for screening mammogram for malignant neoplasm of breast: Secondary | ICD-10-CM

## 2020-01-26 ENCOUNTER — Telehealth: Payer: Self-pay | Admitting: Family Medicine

## 2020-01-26 NOTE — Telephone Encounter (Signed)
Patient is calling to talk about the sinus infection she has and if she should go get covid tested, patient left message and wanted to speak to you regarding this  [phone]

## 2020-01-26 NOTE — Telephone Encounter (Signed)
Call placed to patient. No answer. VM full. 

## 2020-01-27 ENCOUNTER — Other Ambulatory Visit: Payer: Self-pay

## 2020-01-27 ENCOUNTER — Ambulatory Visit
Admission: EM | Admit: 2020-01-27 | Discharge: 2020-01-27 | Disposition: A | Discharge status: Home / Self Care | Payer: Medicare HMO

## 2020-01-27 DIAGNOSIS — J209 Acute bronchitis, unspecified: Secondary | ICD-10-CM

## 2020-01-27 DIAGNOSIS — Z20822 Contact with and (suspected) exposure to covid-19: Secondary | ICD-10-CM

## 2020-01-27 MED ORDER — BENZONATATE 100 MG PO CAPS: 100.0000 mg | ORAL_CAPSULE | Freq: Three times a day (TID) | ORAL | 0 refills | Status: DC

## 2020-01-27 MED ORDER — AZITHROMYCIN 250 MG PO TABS: 250.0000 mg | ORAL_TABLET | Freq: Every day | ORAL | 0 refills | Status: DC

## 2020-01-27 MED ORDER — FLUTICASONE PROPIONATE 50 MCG/ACT NA SUSP: 1.0000 | Freq: Every day | NASAL | 0 refills | Status: DC

## 2020-01-27 NOTE — Discharge Instructions (Addendum)
Take azithromycin as prescribed for bronchitis Take Flonase for congestion Take Tessalon Perles for cough  COVID testing ordered.  It will take between 2-7 days for test results.  Someone will contact you regarding abnormal results.    In the meantime: You should remain isolated in your home for 10 days from symptom onset AND greater than 72 hours after symptoms resolution (absence of fever without the use of fever-reducing medication and improvement in respiratory symptoms), whichever is longer Get plenty of rest and push fluids Use medications daily for symptom relief Use OTC medications like ibuprofen or tylenol as needed fever or pain Call or go to the ED if you have any new or worsening symptoms such as fever, worsening cough, shortness of breath, chest tightness, chest pain, turning blue, changes in mental status, etc..Marland Kitchen

## 2020-01-27 NOTE — ED Triage Notes (Signed)
Pt presents with c/o sinus symptoms for past 2 weeks

## 2020-01-27 NOTE — ED Provider Notes (Deleted)
.  [email or web address]   Emerson Monte, FNP 01/27/20 1019

## 2020-01-27 NOTE — ED Provider Notes (Signed)
[facility]    CSN: [id number] Arrival date & time: 01/27/20  1002      History   Chief Complaint Chief Complaint  Patient presents with  . Nasal Congestion    HPI Megan [name] is a 69 y.o. female.   Who presented to the urgent care for complaint of sneezing, congestion, mild cough with white mucus for the past 2 weeks.  Denies sick exposure to COVID, flu or strep.  Denies recent travel.  Denies aggravating or alleviating symptoms.  Denies previous COVID infection.   Denies fever, chills, fatigue, nasal congestion, rhinorrhea, sore throat SOB, wheezing, chest pain, nausea, vomiting, changes in bowel or bladder habits.       Past Medical History:  Diagnosis Date  . Gout   . High cholesterol   . Hypertension   . Kidney stone   . Leukopenia 05/04/2016  . Seasonal allergies     Patient Active Problem List   Diagnosis Date Noted  . GERD (gastroesophageal reflux disease) 07/14/2019  . Acquired renal cyst of right kidney 07/14/2019  . Rheumatoid arthritis ([place]) 05/09/2019  . Arthritis of carpometacarpal [facility]) joint of right thumb 12/01/2018  . Medial epicondylitis of elbow, right 12/01/2018  . Abdominal bloating with cramps 12/29/2017  . Hypertension 05/19/2017  . DDD (degenerative disc disease), lumbar 05/19/2017  . Hyperlipidemia 05/19/2017  . Borderline diabetes 05/19/2017  . Iron deficiency 05/19/2017  . Hypothyroidism 05/19/2017  . Leukopenia 05/04/2016  . Acquired pes planus 03/01/2012  . Arthritis of right knee 03/01/2012  . Knee pain 02/25/2012    Past Surgical History:  Procedure Laterality Date  . BILATERAL KNEE ARTHROSCOPY    . COLONOSCOPY N/A 07/30/2014   Procedure: COLONOSCOPY;  Surgeon: Danie Binder, MD;  Location[facility];  Service: Endoscopy;  Laterality: N/A;  9:30 AM  . high cholesterol    . Right knee osteotomy      OB History   No obstetric history on file.      Home Medications    Prior to Admission medications     Medication Sig Start Date End Date Taking? Authorizing Provider  atorvastatin (LIPITOR) 10 MG tablet Take 1 tablet (10 mg total) by mouth at bedtime. 10/09/19   [place], Modena Nunnery, MD  azithromycin (ZITHROMAX) 250 MG tablet Take 1 tablet (250 mg total) by mouth daily. Take first 2 tablets together, then 1 every day until finished. 01/27/20   Dashawn Bartnick, Darrelyn Hillock, FNP  benzonatate (TESSALON) 100 MG capsule Take 1 capsule (100 mg total) by mouth every 8 (eight) hours. 01/27/20   Miia Blanks, Darrelyn Hillock, FNP  cyanocobalamin (V-R VITAMIN B-12) 500 MCG tablet Take 1 tablet (500 mcg total) by mouth daily. 05/19/17   Alycia Rossetti, MD  Ferrous Sulfate (IRON) 325 (65 Fe) MG TABS Take by mouth as needed.    [provider]  fluticasone (FLONASE) 50 MCG/ACT nasal spray Place 1 spray into both nostrils daily for 14 days. 01/27/20 02/10/20  Keven Osborn, Darrelyn Hillock, FNP  methotrexate 50 MG/2ML injection Inject 15 mg into the skin once a week. 12/12/19   [provider]  Pyridoxine HCl (VITAMIN B-6 PO) Take by mouth every other day.    [provider]  vitamin C (ASCORBIC ACID) 500 MG tablet Take 1 tablet (500 mg total) by mouth daily. Patient taking differently: Take 500 mg by mouth as needed.  05/19/17   Alycia Rossetti, MD    Family History Family History  Problem Relation Age of  Onset  . Hypertension Mother   . Asthma Mother   . Hypertension Brother   . Other Father        Rare blood disease  . Colon cancer Paternal Uncle   . Bladder Cancer Neg Hx   . Kidney cancer Neg Hx     Social History Social History   Tobacco Use  . Smoking status: Never Smoker  . Smokeless tobacco: Never Used  Substance Use Topics  . Alcohol use: No  . Drug use: No     Allergies   Other, Hydrocodone, and Naproxen   Review of Systems Review of Systems  Constitutional: Negative.   HENT: Positive for congestion.   Respiratory: Positive for cough.   Cardiovascular: Negative.   All other systems  reviewed and are negative.    Physical Exam Triage Vital Signs ED Triage Vitals  Enc Vitals Group     BP 01/27/20 1017 (!) 149/87     Pulse Rate 01/27/20 1017 72     Resp 01/27/20 1017 18     Temp 01/27/20 1017 98.4 F (36.9 C)     Temp src --      SpO2 01/27/20 1017 95 %     Weight --      Height --      Head Circumference --      Peak Flow --      Pain Score 01/27/20 1014 0     Pain Loc --      Pain Edu? --      Excl. in [place]? --    No data found.  Updated Vital Signs BP (!) 149/87   Pulse 72   Temp 98.4 F (36.9 C)   Resp 18   SpO2 95%   Visual Acuity Right Eye Distance:   Left Eye Distance:   Bilateral Distance:    Right Eye Near:   Left Eye Near:    Bilateral Near:     Physical Exam Vitals and nursing note reviewed.  Constitutional:      General: She is not in acute distress.    Appearance: Normal appearance. She is normal weight. She is not ill-appearing, toxic-appearing or diaphoretic.  HENT:     Head: Normocephalic.     Right Ear: Tympanic membrane, ear canal and external ear normal. There is no impacted cerumen.     Left Ear: Tympanic membrane, ear canal and external ear normal. There is no impacted cerumen.     Nose: Congestion present.     Mouth/Throat:     Mouth: Mucous membranes are moist.     Pharynx: Oropharynx is clear. No oropharyngeal exudate.  Cardiovascular:     Rate and Rhythm: Normal rate and regular rhythm.     Pulses: Normal pulses.     Heart sounds: Normal heart sounds. No murmur. No gallop.   Pulmonary:     Effort: Pulmonary effort is normal. No respiratory distress.     Breath sounds: Normal breath sounds. No stridor. No wheezing, rhonchi or rales.  Chest:     Chest wall: No tenderness.  Neurological:     Mental Status: She is alert and oriented to person, place, and time.      UC Treatments / Results  Labs (all labs ordered are listed, but only abnormal results are displayed) Labs Reviewed  NOVEL CORONAVIRUS, NAA     EKG   Radiology No results found.  Procedures Procedures (including critical care time)  Medications Ordered in UC Medications - No data  to display  Initial Impression / Assessment and Plan / UC Course  I have reviewed the triage vital signs and the nursing notes.  Pertinent labs & imaging results that were available during my care of the patient were reviewed by me and considered in my medical decision making (see chart for details).   Patient is stable for discharge. Will treat patient for possible bronchitis Azithromycin was prescribed Flonase will be prescribed Tessalon Perles for cough COVID-19 test was completed for rule out Advised patient to go to ED for worsening of symptoms  Final Clinical Impressions(s) / UC Diagnoses   Final diagnoses:  Acute bronchitis, unspecified organism  COVID-19 ruled out     Discharge Instructions     Take azithromycin as prescribed for bronchitis Take Flonase for congestion Take Tessalon Perles for cough  COVID testing ordered.  It will take between 2-7 days for test results.  Someone will contact you regarding abnormal results.    In the meantime: You should remain isolated in your home for 10 days from symptom onset AND greater than 72 hours after symptoms resolution (absence of fever without the use of fever-reducing medication and improvement in respiratory symptoms), whichever is longer Get plenty of rest and push fluids Use medications daily for symptom relief Use OTC medications like ibuprofen or tylenol as needed fever or pain Call or go to the ED if you have any new or worsening symptoms such as fever, worsening cough, shortness of breath, chest tightness, chest pain, turning blue, changes in mental status, etc...     ED Prescriptions    Medication Sig Dispense Auth. Provider   fluticasone (FLONASE) 50 MCG/ACT nasal spray Place 1 spray into both nostrils daily for 14 days. 16 g Millena Callins, Darrelyn Hillock, FNP    azithromycin (ZITHROMAX) 250 MG tablet Take 1 tablet (250 mg total) by mouth daily. Take first 2 tablets together, then 1 every day until finished. 6 tablet Lorae Roig, Darrelyn Hillock, FNP   benzonatate (TESSALON) 100 MG capsule Take 1 capsule (100 mg total) by mouth every 8 (eight) hours. 21 capsule Aarsh Fristoe, Darrelyn Hillock, FNP     PDMP not reviewed this encounter.   Emerson Monte, [place] 01/27/20 1034

## 2020-01-28 LAB — NOVEL CORONAVIRUS, NAA: SARS-CoV-2, NAA: DETECTED — AB

## 2020-01-29 ENCOUNTER — Other Ambulatory Visit: Payer: Self-pay

## 2020-01-29 ENCOUNTER — Ambulatory Visit (HOSPITAL_COMMUNITY)
Admission: RE | Admit: 2020-01-29 | Discharge: 2020-01-29 | Disposition: A | Discharge status: Home / Self Care | Payer: Medicare HMO | Source: Ambulatory Visit | Attending: Family Medicine | Admitting: Family Medicine

## 2020-01-29 DIAGNOSIS — Z1231 Encounter for screening mammogram for malignant neoplasm of breast: Secondary | ICD-10-CM

## 2020-01-29 NOTE — Telephone Encounter (Signed)
Call placed to patient. No answer. No VM.  

## 2020-01-30 ENCOUNTER — Telehealth (HOSPITAL_COMMUNITY): Payer: Self-pay | Admitting: Emergency Medicine

## 2020-01-30 NOTE — Telephone Encounter (Signed)

## 2020-01-30 NOTE — Telephone Encounter (Signed)
Received call from patient.   Reports that previous number is incorrect. Requested number to be changed to [phone].  Call placed to patient.   States that she had sneezing x2 weeks. Reports that she did go get tested for COVID and was noted positive. States that she had no fever, SOB, cough, loss of taste or smell, fatigue.   States that she is feeling better now, but she remains under quarantine until 02/07/2020.  Inquired as to when she can receive the COVID vaccine. Advised that the CDC recommends that people must have completed the quarantine period and be symptom-free.  Given information to schedule COVID vaccine.

## 2020-01-30 NOTE — Telephone Encounter (Signed)
Multiple calls placed to patient with no answer and no return call.   Message to be closed.  

## 2020-02-07 ENCOUNTER — Other Ambulatory Visit: Payer: Self-pay

## 2020-02-07 ENCOUNTER — Ambulatory Visit (INDEPENDENT_AMBULATORY_CARE_PROVIDER_SITE_OTHER): Payer: Medicare HMO | Admitting: Nurse Practitioner

## 2020-02-07 DIAGNOSIS — Z7189 Other specified counseling: Secondary | ICD-10-CM | POA: Diagnosis not present

## 2020-02-07 DIAGNOSIS — J069 Acute upper respiratory infection, unspecified: Secondary | ICD-10-CM | POA: Diagnosis not present

## 2020-02-07 DIAGNOSIS — J0101 Acute recurrent maxillary sinusitis: Secondary | ICD-10-CM

## 2020-02-07 MED ORDER — DOXYCYCLINE HYCLATE 100 MG PO TABS: 100.0000 mg | ORAL_TABLET | Freq: Two times a day (BID) | ORAL | 0 refills | Status: AC

## 2020-02-07 MED ORDER — ALBUTEROL SULFATE HFA 108 (90 BASE) MCG/ACT IN AERS: 2.0000 | INHALATION_SPRAY | Freq: Four times a day (QID) | RESPIRATORY_TRACT | 0 refills | Status: DC | PRN

## 2020-02-07 NOTE — Progress Notes (Signed)
Virtual Visit via Telephone Note    I connected withSusan [name] on 02/07/2020 at 1122 by telephoneas she was unable to come into office or login to video mychart visit and verified that I am speaking with the correct person using two identifiers.  Pt location: at home   Physician location:  In office, [place], Ishmael Holter, FNP-C    On call: patient and physician   I discussed the limitations, risks, security and privacy concerns of performing an evaluation and management service by telephone and the availability of in person appointments. I also discussed with the patient that there may be a patient responsible charge related to this service. The patient expressed understanding and agreed to proceed.   History of Present Illness:  Pt called in desiring to discuss her COVID positive 13 days ago. She feels better however nasal congestion, sinus pressure and sneezing/cough white discharge continues since her visit to the urgent care. She did complete treatment of Zpack with some decrease in symptoms however sxs have returned. She does feel like she needs to sit to catch her breath if she becomes very active but that sxs resolves almost immediately. No others in household are sick. Pt does have h/o seasonal allergies and notes that recently she has noticed pollen irritation of her allergies. Denies any fever/ chills/ bodyaches/ no vomiting or diarrhea.  Time spent discussing the importance of office visit for non resolving symptoms.   Observations/Objective: NAD noted, able to speak in full sentences but very congested sounding, no cough, no wheeze heard. Skin color appears normal, no visual SOB.   Assessment and Plan:  Your symptoms are consistent with acute bacterial upper respiratory infection with acute sinusitis most likely secondary to post COVID viral infection duration of symptoms.  You May take OTC Zyrtec daily for seasonal allergies with spring.  Should  continue using Flonase  Start and complete antibiotic Inhaler as needed  Go to ER for increasing shortness of breath , difficulty breathing Make an office appointment for worsening or non resolving symptoms   Follow Up Instructions:  I discussed the assessment and treatment plan with the patient. The patient was provided an opportunity to ask questions and all were answered. The patient agreed with the plan and demonstrated an understanding of the instructions.  The patient was advised to call back or seek an in-person evaluation if the symptoms worsen or if the condition fails to improve as anticipated.  I provided 20  minutes of non-face-to-face time during this encounter. End Time [place], FNP-C

## 2020-02-09 ENCOUNTER — Ambulatory Visit: Payer: Medicare HMO | Attending: Internal Medicine

## 2020-02-09 ENCOUNTER — Other Ambulatory Visit: Payer: Self-pay

## 2020-02-09 DIAGNOSIS — Z20822 Contact with and (suspected) exposure to covid-19: Secondary | ICD-10-CM | POA: Diagnosis not present

## 2020-02-10 LAB — NOVEL CORONAVIRUS, NAA: SARS-CoV-2, NAA: NOT DETECTED

## 2020-02-14 ENCOUNTER — Ambulatory Visit: Payer: Self-pay | Admitting: Orthopedic Surgery

## 2020-03-11 ENCOUNTER — Encounter: Payer: Self-pay | Admitting: Family Medicine

## 2020-03-11 ENCOUNTER — Other Ambulatory Visit: Payer: Self-pay

## 2020-03-11 ENCOUNTER — Ambulatory Visit (INDEPENDENT_AMBULATORY_CARE_PROVIDER_SITE_OTHER): Payer: Medicare HMO | Admitting: Family Medicine

## 2020-03-11 VITALS — BP 130/68 | HR 68 | Temp 98.0°F | Resp 14 | Ht 65.0 in | Wt 164.0 lb

## 2020-03-11 DIAGNOSIS — M059 Rheumatoid arthritis with rheumatoid factor, unspecified: Secondary | ICD-10-CM

## 2020-03-11 DIAGNOSIS — E039 Hypothyroidism, unspecified: Secondary | ICD-10-CM

## 2020-03-11 DIAGNOSIS — E611 Iron deficiency: Secondary | ICD-10-CM

## 2020-03-11 DIAGNOSIS — E875 Hyperkalemia: Secondary | ICD-10-CM | POA: Diagnosis not present

## 2020-03-11 DIAGNOSIS — R7303 Prediabetes: Secondary | ICD-10-CM

## 2020-03-11 DIAGNOSIS — E7800 Pure hypercholesterolemia, unspecified: Secondary | ICD-10-CM

## 2020-03-11 DIAGNOSIS — I10 Essential (primary) hypertension: Secondary | ICD-10-CM

## 2020-03-11 MED ORDER — METHOTREXATE 2.5 MG PO TABS: ORAL_TABLET | ORAL | 0 refills | Status: DC

## 2020-03-11 NOTE — Assessment & Plan Note (Signed)
Recheck thyroid function study.  No current treatment as she has declined in the past.

## 2020-03-11 NOTE — Patient Instructions (Addendum)
F/U 6 months for Physical  Vitamin D  1000Iu tablets once a day

## 2020-03-11 NOTE — Progress Notes (Signed)
   Subjective:    Patient ID: Megan [name], female    DOB: Nov 06, 1951, 69 y.o.   MRN: [id number]  Patient presents for Follow-up (is not fasting)  Patient here to follow-up chronic medical problems.  She is much improved from her COVID-19 infection which occurred at the end of February.  She does use albuterol prn, mostly when she is outside and pollen causes cough   She is followed by rheumatology for her rheumatoid arthritis her last visit was in November 2020.  She is on methotrexate pill   She continues to follow with ophthalmology  Hyperlipidemia - she stopped the liptor due to SE, taking Red Yeast rice,  her last LDL was 126 6 months ago total cholesterol 214  SHe still taking her vitamins and supplements including iron tablets   Borderline diabetes mellitus A1c 5.7% 6 months ago due for recheck  She is finishing steroids for gout- given by rheumatologist    Review Of Systems:  GEN- denies fatigue, fever, weight loss,weakness, recent illness HEENT- denies eye drainage, change in vision, nasal discharge, CVS- denies chest pain, palpitations RESP- denies SOB, cough, wheeze ABD- denies N/V, change in stools, abd pain GU- denies dysuria, hematuria, dribbling, incontinence MSK- denies joint pain, muscle aches, injury Neuro- denies headache, dizziness, syncope, seizure activity       Objective:    BP 130/68   Pulse 68   Temp 98 F (36.7 C) (Temporal)   Resp 14   Ht 5\' 5"  (1.651 m)   Wt 164 lb (74.4 kg)   SpO2 99%   BMI 27.29 kg/m  GEN- NAD, alert and oriented x3 HEENT- PERRL, EOMI, non injected sclera, pink conjunctiva, MMM, oropharynx clear Neck- Supple, no thyromegaly CVS- RRR, no murmur RESP-CTAB ABD-NABS,soft,NT,ND EXT- No edema Pulses- Radial, DP- 2+        Assessment & Plan:      Problem List Items Addressed This Visit      Unprioritized   Borderline diabetes    Recheck her labs today.  Of note she had 2 other new supplements one was ginkgo  biloba.  She is already taking multiple supplements advised that she not add these in at this time.  I do recommend that she take vitamin D at 1000 international units consistently daily      Relevant Orders   Hemoglobin A1c   Hyperlipidemia   Relevant Orders   Lipid panel   Hypertension - Primary    Blood pressure control no change in medication.      Relevant Orders   CBC with Differential/Platelet   Comprehensive metabolic panel   Lipid panel   Hypothyroidism    Recheck thyroid function study.  No current treatment as she has declined in the past.      Relevant Orders   TSH   Iron deficiency   Rheumatoid arthritis [facility])    Per rheumatology though the way she speaks I am unsure if she is actually going to continue with the methotrexate treatment.  She is very weary of medications and the side effects.      Relevant Medications   methotrexate (RHEUMATREX) 2.5 MG tablet      Note: This dictation was prepared with Dragon dictation along with smaller phrase technology. Any transcriptional errors that result from this process are unintentional.

## 2020-03-11 NOTE — Assessment & Plan Note (Signed)
Per rheumatology though the way she speaks I am unsure if she is actually going to continue with the methotrexate treatment.  She is very weary of medications and the side effects.

## 2020-03-11 NOTE — Assessment & Plan Note (Addendum)
Recheck her labs today.  Of note she had 2 other new supplements one was ginkgo biloba.  She is already taking multiple supplements advised that she not add these in at this time.  I do recommend that she take vitamin D at 1000 international units consistently daily

## 2020-03-11 NOTE — Assessment & Plan Note (Signed)
Blood pressure control no change in medication. 

## 2020-03-12 LAB — COMPREHENSIVE METABOLIC PANEL
AG Ratio: 1.5 (calc) (ref 1.0–2.5)
ALT: 43 U/L — ABNORMAL HIGH (ref 6–29)
AST: 32 U/L (ref 10–35)
Albumin: 4.2 g/dL (ref 3.6–5.1)
Alkaline phosphatase (APISO): 110 U/L (ref 37–153)
BUN: 16 mg/dL (ref 7–25)
CO2: 29 mmol/L (ref 20–32)
Calcium: 10.4 mg/dL (ref 8.6–10.4)
Chloride: 102 mmol/L (ref 98–110)
Creat: 0.7 mg/dL (ref 0.50–0.99)
Globulin: 2.8 g/dL (calc) (ref 1.9–3.7)
Glucose, Bld: 96 mg/dL (ref 65–99)
Potassium: 5.9 mmol/L — ABNORMAL HIGH (ref 3.5–5.3)
Sodium: 138 mmol/L (ref 135–146)
Total Bilirubin: 0.5 mg/dL (ref 0.2–1.2)
Total Protein: 7 g/dL (ref 6.1–8.1)

## 2020-03-12 LAB — CBC WITH DIFFERENTIAL/PLATELET
Absolute Monocytes: 480 cells/uL (ref 200–950)
Basophils Absolute: 10 cells/uL (ref 0–200)
Basophils Relative: 0.2 %
Eosinophils Absolute: 100 cells/uL (ref 15–500)
Eosinophils Relative: 2 %
HCT: 38.5 % (ref 35.0–45.0)
Hemoglobin: 12.9 g/dL (ref 11.7–15.5)
Lymphs Abs: 980 cells/uL (ref 850–3900)
MCH: 30.9 pg (ref 27.0–33.0)
MCHC: 33.5 g/dL (ref 32.0–36.0)
MCV: 92.1 fL (ref 80.0–100.0)
MPV: 12.3 fL (ref 7.5–12.5)
Monocytes Relative: 9.6 %
Neutro Abs: 3430 cells/uL (ref 1500–7800)
Neutrophils Relative %: 68.6 %
Platelets: 220 10*3/uL (ref 140–400)
RBC: 4.18 10*6/uL (ref 3.80–5.10)
RDW: 13.5 % (ref 11.0–15.0)
Total Lymphocyte: 19.6 %
WBC: 5 10*3/uL (ref 3.8–10.8)

## 2020-03-12 LAB — HEMOGLOBIN A1C
Hgb A1c MFr Bld: 5.8 % of total Hgb — ABNORMAL HIGH (ref <5.7)
Mean Plasma Glucose: 120 (calc)
eAG (mmol/L): 6.6 (calc)

## 2020-03-12 LAB — LIPID PANEL
Cholesterol: 241 mg/dL — ABNORMAL HIGH (ref <200)
HDL: 98 mg/dL (ref >=50)
LDL Cholesterol (Calc): 128 mg/dL (calc) — ABNORMAL HIGH
Non-HDL Cholesterol (Calc): 143 mg/dL (calc) — ABNORMAL HIGH (ref <130)
Total CHOL/HDL Ratio: 2.5 (calc) (ref <5.0)
Triglycerides: 59 mg/dL (ref <150)

## 2020-03-12 LAB — TSH: TSH: 1.52 mIU/L (ref 0.40–4.50)

## 2020-03-12 MED ORDER — SODIUM POLYSTYRENE SULFONATE 15 GM/60ML PO SUSP: 30.0000 g | Freq: Once | ORAL | 0 refills | Status: AC

## 2020-03-12 NOTE — Addendum Note (Signed)
Addended by: Vic Blackbird F on: 03/12/2020 05:14 PM   Modules accepted: Orders

## 2020-03-14 ENCOUNTER — Other Ambulatory Visit: Payer: Self-pay

## 2020-03-14 ENCOUNTER — Other Ambulatory Visit: Payer: Medicare HMO

## 2020-03-14 ENCOUNTER — Ambulatory Visit: Payer: Medicare HMO | Attending: Internal Medicine

## 2020-03-14 DIAGNOSIS — Z23 Encounter for immunization: Secondary | ICD-10-CM

## 2020-03-14 DIAGNOSIS — E875 Hyperkalemia: Secondary | ICD-10-CM | POA: Diagnosis not present

## 2020-03-14 NOTE — Progress Notes (Signed)
   Covid-19 Vaccination Clinic  Name:  Megan [name]    MRN: [id number] DOB: Jun 26, 1951  03/14/2020  Megan [name] was observed post Covid-19 immunization for 15 minutes without incident. She was provided with Vaccine Information Sheet and instruction to access the V-Safe system.   Megan [name] was instructed to call 911 with any severe reactions post vaccine: Marland Kitchen Difficulty breathing  . Swelling of face and throat  . A fast heartbeat  . A bad rash all over body  . Dizziness and weakness   Immunizations Administered    Name Date Dose VIS Date Route   Moderna COVID-19 Vaccine 03/14/2020 11:19 AM 0.5 mL 10/31/2019 Intramuscular   Manufacturer: Moderna   Lot: [id number]   [id number]

## 2020-03-15 DIAGNOSIS — M0609 Rheumatoid arthritis without rheumatoid factor, multiple sites: Secondary | ICD-10-CM | POA: Diagnosis not present

## 2020-03-15 DIAGNOSIS — Z6828 Body mass index (BMI) 28.0-28.9, adult: Secondary | ICD-10-CM | POA: Diagnosis not present

## 2020-03-15 DIAGNOSIS — M2550 Pain in unspecified joint: Secondary | ICD-10-CM | POA: Diagnosis not present

## 2020-03-15 DIAGNOSIS — E663 Overweight: Secondary | ICD-10-CM | POA: Diagnosis not present

## 2020-03-15 LAB — BASIC METABOLIC PANEL
BUN: 15 mg/dL (ref 7–25)
CO2: 29 mmol/L (ref 20–32)
Calcium: 9.7 mg/dL (ref 8.6–10.4)
Chloride: 104 mmol/L (ref 98–110)
Creat: 0.66 mg/dL (ref 0.50–0.99)
Glucose, Bld: 98 mg/dL (ref 65–99)
Potassium: 4.4 mmol/L (ref 3.5–5.3)
Sodium: 141 mmol/L (ref 135–146)

## 2020-04-03 ENCOUNTER — Ambulatory Visit: Payer: Medicare HMO | Admitting: Orthopedic Surgery

## 2020-04-16 ENCOUNTER — Telehealth: Payer: Self-pay | Admitting: Family Medicine

## 2020-04-16 ENCOUNTER — Ambulatory Visit: Payer: Medicare HMO | Attending: Internal Medicine

## 2020-04-16 DIAGNOSIS — Z23 Encounter for immunization: Secondary | ICD-10-CM

## 2020-04-16 NOTE — Telephone Encounter (Signed)
Patient called in with c/o of bilateral warmth to below the knee. Patient states that Megan [name] has been experiencing warmth below the knee on both legs for a few weeks Megan [name] is unsure if it is related to her previous covid infection or if its related to her arthritis. Megan [name] denies swelling in the site and states that it feel warm like see is urinating on them. Advised to schedule appointment patient states Megan [name] is unable to come in today but Megan [name] scheduled for an 8 am appointment on tomorrow.

## 2020-04-16 NOTE — Progress Notes (Signed)
   Covid-19 Vaccination Clinic  Name:  Megan [name]    MRN: [id number] DOB: [birth year]  04/16/2020  [name] was observed post Covid-19 immunization for 15 minutes without incident. She was provided with Vaccine Information Sheet and instruction to access the V-Safe system.   [name] was instructed to call 911 with any severe reactions post vaccine: Marland Kitchen Difficulty breathing  . Swelling of face and throat  . A fast heartbeat  . A bad rash all over body  . Dizziness and weakness   Immunizations Administered    Name Date Dose VIS Date Route   Moderna COVID-19 Vaccine 04/16/2020 12:12 PM 0.5 mL 10/2019 Intramuscular   Manufacturer: Levan Hurst   Lot: [id number]   [id number]

## 2020-04-17 ENCOUNTER — Encounter: Payer: Self-pay | Admitting: Family Medicine

## 2020-04-17 ENCOUNTER — Ambulatory Visit (INDEPENDENT_AMBULATORY_CARE_PROVIDER_SITE_OTHER): Payer: Medicare HMO | Admitting: Family Medicine

## 2020-04-17 ENCOUNTER — Ambulatory Visit: Payer: Medicare HMO

## 2020-04-17 ENCOUNTER — Other Ambulatory Visit: Payer: Self-pay

## 2020-04-17 VITALS — BP 134/62 | HR 82 | Temp 97.9°F | Resp 14 | Ht 65.0 in | Wt 169.0 lb

## 2020-04-17 DIAGNOSIS — M62830 Muscle spasm of back: Secondary | ICD-10-CM

## 2020-04-17 DIAGNOSIS — E663 Overweight: Secondary | ICD-10-CM | POA: Diagnosis not present

## 2020-04-17 DIAGNOSIS — M2550 Pain in unspecified joint: Secondary | ICD-10-CM | POA: Diagnosis not present

## 2020-04-17 DIAGNOSIS — M549 Dorsalgia, unspecified: Secondary | ICD-10-CM | POA: Diagnosis not present

## 2020-04-17 DIAGNOSIS — M0609 Rheumatoid arthritis without rheumatoid factor, multiple sites: Secondary | ICD-10-CM | POA: Diagnosis not present

## 2020-04-17 DIAGNOSIS — M059 Rheumatoid arthritis with rheumatoid factor, unspecified: Secondary | ICD-10-CM

## 2020-04-17 DIAGNOSIS — Z6828 Body mass index (BMI) 28.0-28.9, adult: Secondary | ICD-10-CM | POA: Diagnosis not present

## 2020-04-17 MED ORDER — TIZANIDINE HCL 4 MG PO TABS: 4.0000 mg | ORAL_TABLET | Freq: Four times a day (QID) | ORAL | 0 refills | Status: DC | PRN

## 2020-04-17 NOTE — Progress Notes (Signed)
Subjective:    Patient ID: Megan [name], female    DOB: 1951/03/03, 69 y.o.   MRN: [id number]  Patient presents for Joint Pain (R shoulder blade pain, back pain- had COVID shot on 04/16/2020) and Skin Sensation (episodes of warmth to LE that goes from knees to ankles- no edema or pain)   Pt here with her chronic arthritis pain she feels like she is in a flare of her rheumatoid arthritis.  She has had discomfort for the past week or so.  She has a lot of discomfort in her upper extremities especially the right she feels like To her shoulder blade that feels like a spasm.  She has been doing topicals.  She is currently on methotrexate once a week.  She has not called her rheumatologist but states that her flare started when she switched from the injection to the pills.  She also states that she gets of warm sensation that goes from her knees down to her ankles but she has not noticed any redness or swelling of the joints or skin.  It goes away after couple seconds.  She is already scheduled to see orthopedics on June 7 for her chronic knee pain with x-rays.  She has not had any fever cough congestion.  She had her second COVID-19 vaccine yesterday and only complaint is some soreness in her arm.  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 and requests a note out of work for this week because of her flare   No new tingling or numbness in ext         Review Of Systems:  GEN- denies fatigue, fever, weight loss,weakness, recent illness HEENT- denies eye drainage, change in vision, nasal discharge, CVS- denies chest pain, palpitations RESP- denies SOB, cough, wheeze ABD- denies N/V, change in stools, abd pain GU- denies dysuria, hematuria, dribbling, incontinence MSK-+ joint pain, muscle aches, injury Neuro- denies headache, dizziness, syncope, seizure activity       Objective:    BP 134/62   Pulse 82   Temp 97.9 F (36.6 C)  (Temporal)   Resp 14   Ht 5\' 5"  (1.651 m)   Wt 169 lb (76.7 kg)   SpO2 97%   BMI 28.12 kg/m  GEN- NAD, alert and oriented x3 HEENT- PERRL, EOMI, non injected sclera, pink conjunctiva, MMM, oropharynx clear Neck- Supple, no thyromegaly CVS- RRR, no murmur RESP-CTAB MSK- Decreased ROM RUE compare to left, TTP shoulder blade and mid paraspinals, +spasm, rotator cuff in tact, no swelling of RUE Bilat knee, no effusion, no erythema, no warmth, fair ROM bilat ankles/knees Fair ROM neck, C spien NT  NEURO-CNII-XII in tact, no focal deficits, sensation grossly intact LE, moving LE equally  EXT- No edema Pulses- Radial,2+        Assessment & Plan:      Problem List Items Addressed This Visit      Unprioritized   Rheumatoid arthritis ([place]) - Primary    Currently in the midst of arthritis flare.  Recommend that she call her rheumatologist to see what they would like to change in her medications.  For the spasm that she has in her back and shoulder blade I will give her Zanaflex to take.  She can also use topicals and acetaminophen.  No imaging needed today.  The shoulder discomfort and catches  Are some of her typical symptoms with her flares per patient.  I will  give her a note to be out of work this week.    For the warm Sensation from the knees this is likely associated with her inflammatory state.  I do not see any effusion on exam and no sign of any infection.  She will follow-up with orthopedics as scheduled in June.      Relevant Medications   tiZANidine (ZANAFLEX) 4 MG tablet    Other Visit Diagnoses    Muscle spasm of back          Note: This dictation was prepared with Dragon dictation along with smaller phrase technology. Any transcriptional errors that result from this process are unintentional.

## 2020-04-17 NOTE — Assessment & Plan Note (Addendum)
Currently in the midst of arthritis flare.  Recommend that she call her rheumatologist to see what they would like to change in her medications.  For the spasm that she has in her back and shoulder blade I will give her Zanaflex to take.  She can also use topicals and acetaminophen.  No imaging needed today.  The shoulder discomfort and catches  Are some of her typical symptoms with her flares per patient.  I will give her a note to be out of work this week.    For the warm Sensation from the knees this is likely associated with her inflammatory state.  I do not see any effusion on exam and no sign of any infection.  She will follow-up with orthopedics as scheduled in June.

## 2020-04-17 NOTE — Patient Instructions (Addendum)
Call your rheumatologist Use the topical rubs Take the muscle relaxer F/U 5 months for Physical

## 2020-05-03 DIAGNOSIS — M1990 Unspecified osteoarthritis, unspecified site: Secondary | ICD-10-CM | POA: Diagnosis not present

## 2020-05-03 DIAGNOSIS — E663 Overweight: Secondary | ICD-10-CM | POA: Diagnosis not present

## 2020-05-03 DIAGNOSIS — Z8249 Family history of ischemic heart disease and other diseases of the circulatory system: Secondary | ICD-10-CM | POA: Diagnosis not present

## 2020-05-03 DIAGNOSIS — R030 Elevated blood-pressure reading, without diagnosis of hypertension: Secondary | ICD-10-CM | POA: Diagnosis not present

## 2020-05-03 DIAGNOSIS — Z833 Family history of diabetes mellitus: Secondary | ICD-10-CM | POA: Diagnosis not present

## 2020-05-03 DIAGNOSIS — Z6829 Body mass index (BMI) 29.0-29.9, adult: Secondary | ICD-10-CM | POA: Diagnosis not present

## 2020-05-03 DIAGNOSIS — Z79899 Other long term (current) drug therapy: Secondary | ICD-10-CM | POA: Diagnosis not present

## 2020-05-03 DIAGNOSIS — M069 Rheumatoid arthritis, unspecified: Secondary | ICD-10-CM | POA: Diagnosis not present

## 2020-05-03 DIAGNOSIS — Z825 Family history of asthma and other chronic lower respiratory diseases: Secondary | ICD-10-CM | POA: Diagnosis not present

## 2020-05-03 DIAGNOSIS — Z008 Encounter for other general examination: Secondary | ICD-10-CM | POA: Diagnosis not present

## 2020-05-06 ENCOUNTER — Encounter: Payer: Self-pay | Admitting: Orthopedic Surgery

## 2020-05-06 ENCOUNTER — Other Ambulatory Visit: Payer: Self-pay

## 2020-05-06 ENCOUNTER — Ambulatory Visit: Payer: Medicare HMO

## 2020-05-06 ENCOUNTER — Ambulatory Visit: Payer: Medicare HMO | Admitting: Orthopedic Surgery

## 2020-05-06 VITALS — BP 126/91 | HR 80 | Ht 65.0 in | Wt 167.2 lb

## 2020-05-06 DIAGNOSIS — M170 Bilateral primary osteoarthritis of knee: Secondary | ICD-10-CM | POA: Diagnosis not present

## 2020-05-06 DIAGNOSIS — M1710 Unilateral primary osteoarthritis, unspecified knee: Secondary | ICD-10-CM

## 2020-05-06 DIAGNOSIS — M25562 Pain in left knee: Secondary | ICD-10-CM

## 2020-05-06 DIAGNOSIS — M25561 Pain in right knee: Secondary | ICD-10-CM

## 2020-05-06 DIAGNOSIS — G8929 Other chronic pain: Secondary | ICD-10-CM | POA: Diagnosis not present

## 2020-05-06 NOTE — Progress Notes (Signed)
Chief Complaint  Patient presents with  . Knee Pain    Bilateral/ doing ok    Status post high tibial osteotomy opening wedge right knee osteoarthritis left knee  Patient says she has gained 20 pounds but has lost 4 of the pounds after a course of steroids  Minimal complaints right and left knee feels some increased pressure on the right knee she attributes to the weight gain  She has excellent range of motion in both knees both knees are stable good strength is noted bilaterally neurovascular exam is intact  Bilateral knee x-rays show a fixed plate opening wedge osteotomy with moderate arthritis medial compartment and then mild to moderate arthritis left knee  Recommend continue current management weight loss as indicated  Follow-up in a year for repeat x-ray  Encounter Diagnoses  Name Primary?  Megan [name] Arthritis of knee Yes  . Chronic pain of left knee   . Chronic pain of right knee

## 2020-10-09 DIAGNOSIS — E663 Overweight: Secondary | ICD-10-CM | POA: Diagnosis not present

## 2020-10-09 DIAGNOSIS — M0609 Rheumatoid arthritis without rheumatoid factor, multiple sites: Secondary | ICD-10-CM | POA: Diagnosis not present

## 2020-10-09 DIAGNOSIS — Z6828 Body mass index (BMI) 28.0-28.9, adult: Secondary | ICD-10-CM | POA: Diagnosis not present

## 2020-10-09 DIAGNOSIS — M549 Dorsalgia, unspecified: Secondary | ICD-10-CM | POA: Diagnosis not present

## 2020-10-09 DIAGNOSIS — M2550 Pain in unspecified joint: Secondary | ICD-10-CM | POA: Diagnosis not present

## 2020-11-19 ENCOUNTER — Other Ambulatory Visit: Payer: Self-pay

## 2020-11-19 ENCOUNTER — Ambulatory Visit (INDEPENDENT_AMBULATORY_CARE_PROVIDER_SITE_OTHER): Payer: Medicare HMO | Admitting: Family Medicine

## 2020-11-19 ENCOUNTER — Encounter: Payer: Self-pay | Admitting: Family Medicine

## 2020-11-19 VITALS — BP 110/70 | HR 80 | Temp 98.2°F | Resp 16 | Ht 65.0 in | Wt 171.0 lb

## 2020-11-19 DIAGNOSIS — M059 Rheumatoid arthritis with rheumatoid factor, unspecified: Secondary | ICD-10-CM | POA: Diagnosis not present

## 2020-11-19 DIAGNOSIS — E039 Hypothyroidism, unspecified: Secondary | ICD-10-CM | POA: Diagnosis not present

## 2020-11-19 DIAGNOSIS — E611 Iron deficiency: Secondary | ICD-10-CM | POA: Diagnosis not present

## 2020-11-19 DIAGNOSIS — R7303 Prediabetes: Secondary | ICD-10-CM | POA: Diagnosis not present

## 2020-11-19 DIAGNOSIS — R0989 Other specified symptoms and signs involving the circulatory and respiratory systems: Secondary | ICD-10-CM | POA: Diagnosis not present

## 2020-11-19 DIAGNOSIS — E7800 Pure hypercholesterolemia, unspecified: Secondary | ICD-10-CM | POA: Diagnosis not present

## 2020-11-19 NOTE — Assessment & Plan Note (Signed)
She is on iron supplementation.

## 2020-11-19 NOTE — Assessment & Plan Note (Signed)
He is using natural anti-inflammatories to treat her joint pain instead of DMARD

## 2020-11-19 NOTE — Progress Notes (Signed)
   Subjective:    Patient ID: Megan [name], female    DOB: [birth year], 69 y.o.   MRN: [id number]  Patient presents for Follow-up (Is fasting/)   Pt here to f/u chronic medical problems  She is not on any meds for her RA, she is using joint supplements and inflammatory teas that she drinks  CBD cream has also helped.    she is using topical on her knees as well, seen by ortho in the summer    Borderline DM- 5.8% in April of this year, she is concerned about developing diabetes  She is fasting today    COIVD booster on Thursday     She has some intermittant mucous in throat/chest for the past few days, minimal cough, but she is able to get the mucous out, no sinus pressure,no fever  mucinex does not work for her Feels well, no GI symptoms  she has had COVID-19 in past   she did get COVID -19 vaccine        Review Of Systems:  GEN- denies fatigue, fever, weight loss,weakness, recent illness HEENT- denies eye drainage, change in vision, nasal discharge, CVS- denies chest pain, palpitations RESP- denies SOB, +cough, wheeze ABD- denies N/V, change in stools, abd pain GU- denies dysuria, hematuria, dribbling, incontinence MSK- + joint pain, muscle aches, injury Neuro- denies headache, dizziness, syncope, seizure activity       Objective:    BP 110/70   Pulse 80   Temp 98.2 F (36.8 C) (Temporal)   Resp 16   Ht 5\' 5"  (1.651 m)   Wt 171 lb (77.6 kg)   SpO2 98%   BMI 28.46 kg/m  GEN- NAD, alert and oriented x3 HEENT- PERRL, EOMI, non injected sclera, pink conjunctiva, MMM, oropharynx clear, nares clear, TM clear no effusion, no sinus tenderness  Neck- Supple, no thyromegaly CVS- RRR, no murmur RESP-CTAB ABD-NABS,soft,NT,ND EXT- No edema Pulses- Radial, DP- 2+        Assessment & Plan:      Problem List Items Addressed This Visit      Unprioritized   Borderline diabetes   Relevant Orders   CBC with Differential/Platelet   Comprehensive metabolic panel    Hemoglobin A1c   Hyperlipidemia   Relevant Orders   Lipid panel   Hypothyroidism - Primary    Recheck TSH.  She wants to have her labs checked regularly but is not on any prescription medications like to use natural supplements.      Relevant Orders   TSH   Iron deficiency    She is on iron supplementation.      Relevant Orders   Iron, TIBC and Ferritin Panel   Rheumatoid arthritis ([place])    He is using natural anti-inflammatories to treat her joint pain instead of DMARD       Other Visit Diagnoses    Chest congestion       Chest congestion/mucous however she is not displaying full URI symptoms yet.  She states Mucinex does not work.  She can try Coricidin mixed nasal spray      Note: This dictation was prepared with Dragon dictation along with smaller phrase technology. Any transcriptional errors that result from this process are unintentional.

## 2020-11-19 NOTE — Assessment & Plan Note (Signed)
Recheck TSH.  She wants to have her labs checked regularly but is not on any prescription medications like to use natural supplements.

## 2020-11-19 NOTE — Patient Instructions (Addendum)
Nasal saline rinse/ flonase  Coricidan HBP for congestion We will call with lab results  F/U 6 months for wellness visit

## 2020-11-20 LAB — TSH: TSH: 1.73 mIU/L (ref 0.40–4.50)

## 2020-11-20 LAB — COMPREHENSIVE METABOLIC PANEL
AG Ratio: 1.6 (calc) (ref 1.0–2.5)
ALT: 12 U/L (ref 6–29)
AST: 21 U/L (ref 10–35)
Albumin: 4.3 g/dL (ref 3.6–5.1)
Alkaline phosphatase (APISO): 96 U/L (ref 37–153)
BUN: 10 mg/dL (ref 7–25)
CO2: 28 mmol/L (ref 20–32)
Calcium: 10.5 mg/dL — ABNORMAL HIGH (ref 8.6–10.4)
Chloride: 103 mmol/L (ref 98–110)
Creat: 0.73 mg/dL (ref 0.60–0.93)
Globulin: 2.7 g/dL (calc) (ref 1.9–3.7)
Glucose, Bld: 74 mg/dL (ref 65–99)
Potassium: 4 mmol/L (ref 3.5–5.3)
Sodium: 140 mmol/L (ref 135–146)
Total Bilirubin: 0.5 mg/dL (ref 0.2–1.2)
Total Protein: 7 g/dL (ref 6.1–8.1)

## 2020-11-20 LAB — IRON,TIBC AND FERRITIN PANEL
%SAT: 21 % (calc) (ref 16–45)
Ferritin: 121 ng/mL (ref 16–288)
Iron: 70 ug/dL (ref 45–160)
TIBC: 338 mcg/dL (calc) (ref 250–450)

## 2020-11-20 LAB — CBC WITH DIFFERENTIAL/PLATELET
Absolute Monocytes: 315 cells/uL (ref 200–950)
Basophils Absolute: 9 cells/uL (ref 0–200)
Basophils Relative: 0.3 %
Eosinophils Absolute: 177 cells/uL (ref 15–500)
Eosinophils Relative: 5.9 %
HCT: 37.3 % (ref 35.0–45.0)
Hemoglobin: 12.9 g/dL (ref 11.7–15.5)
Lymphs Abs: 1293 cells/uL (ref 850–3900)
MCH: 30.4 pg (ref 27.0–33.0)
MCHC: 34.6 g/dL (ref 32.0–36.0)
MCV: 87.8 fL (ref 80.0–100.0)
MPV: 12.2 fL (ref 7.5–12.5)
Monocytes Relative: 10.5 %
Neutro Abs: 1206 cells/uL — ABNORMAL LOW (ref 1500–7800)
Neutrophils Relative %: 40.2 %
Platelets: 205 10*3/uL (ref 140–400)
RBC: 4.25 10*6/uL (ref 3.80–5.10)
RDW: 11.8 % (ref 11.0–15.0)
Total Lymphocyte: 43.1 %
WBC: 3 10*3/uL — ABNORMAL LOW (ref 3.8–10.8)

## 2020-11-20 LAB — HEMOGLOBIN A1C
Hgb A1c MFr Bld: 5.9 % of total Hgb — ABNORMAL HIGH (ref <5.7)
Mean Plasma Glucose: 123 mg/dL
eAG (mmol/L): 6.8 mmol/L

## 2020-11-20 LAB — LIPID PANEL
Cholesterol: 257 mg/dL — ABNORMAL HIGH (ref <200)
HDL: 93 mg/dL (ref >=50)
LDL Cholesterol (Calc): 148 mg/dL (calc) — ABNORMAL HIGH
Non-HDL Cholesterol (Calc): 164 mg/dL (calc) — ABNORMAL HIGH (ref <130)
Total CHOL/HDL Ratio: 2.8 (calc) (ref <5.0)
Triglycerides: 67 mg/dL (ref <150)

## 2020-11-21 ENCOUNTER — Telehealth: Payer: Self-pay | Admitting: Family Medicine

## 2020-11-21 ENCOUNTER — Ambulatory Visit: Payer: Medicare HMO | Attending: Internal Medicine

## 2020-11-21 DIAGNOSIS — Z23 Encounter for immunization: Secondary | ICD-10-CM

## 2020-11-21 NOTE — Telephone Encounter (Signed)
Called pt, vm for call bk

## 2020-11-21 NOTE — Telephone Encounter (Signed)
Call for Lab results

## 2020-11-21 NOTE — Progress Notes (Signed)
   Covid-19 Vaccination Clinic  Name:  Megan [name]    MRN: [id number] DOB: 1951-09-10  11/21/2020  Megan [name] was observed post Covid-19 immunization for 15 minutes without incident. She was provided with Vaccine Information Sheet and instruction to access the V-Safe system.   Megan [name] was instructed to call 911 with any severe reactions post vaccine: Marland Kitchen Difficulty breathing  . Swelling of face and throat  . A fast heartbeat  . A bad rash all over body  . Dizziness and weakness   Immunizations Administered    Name Date Dose VIS Date Route   Moderna Covid-19 Booster Vaccine 11/21/2020  1:43 PM 0.25 mL 09/18/2020 Intramuscular   Manufacturer: Moderna   Lot: [id number]   [place]: [id number]

## 2020-11-27 ENCOUNTER — Telehealth: Payer: Self-pay | Admitting: Family Medicine

## 2020-11-27 NOTE — Telephone Encounter (Signed)
Called pt to give lab results multiple times, cell goes to vm and no answer when calling husbands cell

## 2020-11-27 NOTE — Telephone Encounter (Signed)
call for lab results

## 2020-12-24 ENCOUNTER — Encounter: Payer: Medicare HMO | Admitting: Family Medicine

## 2021-01-24 DIAGNOSIS — M549 Dorsalgia, unspecified: Secondary | ICD-10-CM | POA: Diagnosis not present

## 2021-01-24 DIAGNOSIS — M0609 Rheumatoid arthritis without rheumatoid factor, multiple sites: Secondary | ICD-10-CM | POA: Diagnosis not present

## 2021-01-24 DIAGNOSIS — M2550 Pain in unspecified joint: Secondary | ICD-10-CM | POA: Diagnosis not present

## 2021-01-24 DIAGNOSIS — Z6829 Body mass index (BMI) 29.0-29.9, adult: Secondary | ICD-10-CM | POA: Diagnosis not present

## 2021-01-24 DIAGNOSIS — E663 Overweight: Secondary | ICD-10-CM | POA: Diagnosis not present

## 2021-01-27 ENCOUNTER — Encounter: Payer: Self-pay | Admitting: Family Medicine

## 2021-01-27 ENCOUNTER — Other Ambulatory Visit: Payer: Self-pay

## 2021-01-27 ENCOUNTER — Ambulatory Visit (INDEPENDENT_AMBULATORY_CARE_PROVIDER_SITE_OTHER): Payer: Medicare HMO | Admitting: Family Medicine

## 2021-01-27 VITALS — BP 112/72 | HR 92 | Temp 98.2°F | Resp 14 | Ht 65.0 in | Wt 177.0 lb

## 2021-01-27 DIAGNOSIS — I10 Essential (primary) hypertension: Secondary | ICD-10-CM | POA: Diagnosis not present

## 2021-01-27 DIAGNOSIS — D700 Congenital agranulocytosis: Secondary | ICD-10-CM | POA: Diagnosis not present

## 2021-01-27 DIAGNOSIS — R7303 Prediabetes: Secondary | ICD-10-CM | POA: Diagnosis not present

## 2021-01-27 DIAGNOSIS — M059 Rheumatoid arthritis with rheumatoid factor, unspecified: Secondary | ICD-10-CM

## 2021-01-27 DIAGNOSIS — Z0000 Encounter for general adult medical examination without abnormal findings: Secondary | ICD-10-CM

## 2021-01-27 DIAGNOSIS — Z780 Asymptomatic menopausal state: Secondary | ICD-10-CM

## 2021-01-27 DIAGNOSIS — Z0001 Encounter for general adult medical examination with abnormal findings: Secondary | ICD-10-CM

## 2021-01-27 DIAGNOSIS — E611 Iron deficiency: Secondary | ICD-10-CM

## 2021-01-27 DIAGNOSIS — E039 Hypothyroidism, unspecified: Secondary | ICD-10-CM | POA: Diagnosis not present

## 2021-01-27 DIAGNOSIS — Z1231 Encounter for screening mammogram for malignant neoplasm of breast: Secondary | ICD-10-CM | POA: Diagnosis not present

## 2021-01-27 MED ORDER — PRAVASTATIN SODIUM 20 MG PO TABS: ORAL_TABLET | ORAL | 3 refills | Status: DC

## 2021-01-27 NOTE — Addendum Note (Signed)
Addended by: Vic Blackbird F on: 01/27/2021 05:36 PM   Modules accepted: Orders

## 2021-01-27 NOTE — Patient Instructions (Addendum)
Schedule bone density and mammogram at [facility]  We will call call with lab results  Schedule with new primary care

## 2021-01-27 NOTE — Progress Notes (Addendum)
Subjective:   Patient presents for Medicare Annual/Subsequent preventive examination.               Pt here Wellness visit   Rheumatoid arthriitis- she had visit recently, off all meds, doing okay    She is using Lion's Mane, herbal tea, takes natural pain pill and liver cleanse intermitantly   Borderline DM- last A1C 5.9% in December  HLD- has high ldl, willing to try    She is using magnesium- using for sleep and and bowels 2-3 times a week   Vitamin C / B 12/ vitamin D 1000IU    She had had mildly elevated calcium at  10.5,occ vitamin D    Takes cod liver oil/ black seed oil  Iron def anemia- takes randomly    She takes all her supplements randomly    Review Past Medical/Family/Social: Per EMR   Risk Factors  Current exercise habits: She stays active , exercise class and walks at home  Dietary issues discussed: She has gained 7lbs from eating  "too many baked goods" during the holidays   Cardiac risk factors: Hypertension  Depression Screen  (Note: if answer to either of the following is "Yes", a more complete depression screening is indicated)  Over the past two weeks, have you felt down, depressed or hopeless? No Over the past two weeks, have you felt little interest or pleasure in doing things? No Have you lost interest or pleasure in daily life? No Do you often feel hopeless? No Do you cry easily over simple problems? No   Activities of Daily Living  In your present state of health, do you have any difficulty performing the following activities?:  Driving? No  Managing money? No  Feeding yourself? No  Getting from bed to chair? No  Climbing a flight of stairs? No  Preparing food and eating?: No  Bathing or showering? No  Getting dressed: No  Getting to the toilet? No  Using the toilet:No  Moving around from place to place: No  In the past year have you fallen or had a near fall?:No   Hearing Difficulties: No  Do you often ask people to speak up  or repeat themselves? No  Do you experience ringing or noises in your ears? No Do you have difficulty understanding soft or whispered voices? No  Do you feel that you have a problem with memory? No Do you often misplace items? No  Do you feel safe at home? Yes  Cognitive Testing  Alert? Yes Normal Appearance?Yes  Oriented to person? Yes Place? Yes  Time? Yes  Recall of three objects? Yes  Can perform simple calculations? Yes  Displays appropriate judgment?Yes  Can read the correct time from a watch face?Yes   List the Names of Other Physician/Practitioners you currently use:  Eye doctor- [facility] - Dr. Burney Gauze     Screening Tests / Date Colonoscopy - done in 2015                    Zostavax - Declines  Bone density- Due  Mammogram - Due  Influenza Vaccine  Declines  Tetanus/tdap UTD    ROS: GEN- denies fatigue, fever, weight loss,weakness, recent illness HEENT- denies eye drainage, change in vision, nasal discharge, CVS- denies chest pain, palpitations RESP- denies SOB, cough, wheeze ABD- denies N/V, change in stools, abd pain GU- denies dysuria, hematuria, dribbling, incontinence MSK- denies joint pain, muscle aches, injury Neuro- denies headache, dizziness, syncope, seizure activity  Physical: GEN- NAD, alert and oriented x3 HEENT- PERRL, EOMI, non injected sclera, pink conjunctiva, MMM, oropharynx clear Neck- Supple, no thryomegaly CVS- RRR, no murmur RESP-CTAB ABD-NABS,SOFT,ND,NT Psych normal affect and mood  EXT- No edema Pulses- Radial, DP- 2+   Assessment:    Annual wellness medicare exam   Plan:    During the course of the visit the patient was educated and counseled about appropriate screening and preventive services including:  Screening mammography -patient will schedule with Bone density  She declines immunizations.   History of iron deficiency anemia we will recheck her iron stores.   Hypertension  blood pressures controlled.  History of borderline diabetes mellitus - recent A1c stable, discussed reducing sugars  ADVISED she would be safe to lose the the 7-8 lbs she picked up, she though her weight should go down 30lbs, which would be unhealthy for her at this age   Rheumatoid arthritis she is followed by rheumatologytaking supplemenst, no current meds     History of abnormal TSH-no replacement, recent TSH at goal  Hypercalcemia- was in supplements, recheck today   HLD she agrees to TIW pravastatin   Fall.AUDIT C/Depression screen neg  Full Code   Diet review for nutrition referral? Yes ____ Not Indicated __x__  Patient Instructions (the written plan) was given to the patient.  Medicare Attestation  I have personally reviewed:  The patient's medical and social history  Their use of alcohol, tobacco or illicit drugs  Their current medications and supplements  The patient's functional ability including ADLs,fall risks, home safety risks, cognitive, and hearing and visual impairment  Diet and physical activities  Evidence for depression or mood disorders  The patient's weight, height, BMI, and visual acuity have been recorded in the chart. I have made referrals, counseling, and provided education to the patient based on review of the above and I have provided the patient with a written personalized care plan for preventive services.

## 2021-01-28 ENCOUNTER — Encounter: Payer: Medicare HMO | Admitting: Family Medicine

## 2021-01-28 LAB — CBC WITH DIFFERENTIAL/PLATELET
Absolute Monocytes: 424 cells/uL (ref 200–950)
Basophils Absolute: 20 cells/uL (ref 0–200)
Basophils Relative: 0.5 %
Eosinophils Absolute: 216 cells/uL (ref 15–500)
Eosinophils Relative: 5.4 %
HCT: 37.1 % (ref 35.0–45.0)
Hemoglobin: 12.8 g/dL (ref 11.7–15.5)
Lymphs Abs: 1376 cells/uL (ref 850–3900)
MCH: 30.3 pg (ref 27.0–33.0)
MCHC: 34.5 g/dL (ref 32.0–36.0)
MCV: 87.9 fL (ref 80.0–100.0)
MPV: 12.2 fL (ref 7.5–12.5)
Monocytes Relative: 10.6 %
Neutro Abs: 1964 cells/uL (ref 1500–7800)
Neutrophils Relative %: 49.1 %
Platelets: 226 10*3/uL (ref 140–400)
RBC: 4.22 10*6/uL (ref 3.80–5.10)
RDW: 12.3 % (ref 11.0–15.0)
Total Lymphocyte: 34.4 %
WBC: 4 10*3/uL (ref 3.8–10.8)

## 2021-01-28 LAB — COMPREHENSIVE METABOLIC PANEL
AG Ratio: 1.5 (calc) (ref 1.0–2.5)
ALT: 10 U/L (ref 6–29)
AST: 18 U/L (ref 10–35)
Albumin: 4.1 g/dL (ref 3.6–5.1)
Alkaline phosphatase (APISO): 94 U/L (ref 37–153)
BUN: 17 mg/dL (ref 7–25)
CO2: 28 mmol/L (ref 20–32)
Calcium: 10.7 mg/dL — ABNORMAL HIGH (ref 8.6–10.4)
Chloride: 104 mmol/L (ref 98–110)
Creat: 0.78 mg/dL (ref 0.60–0.93)
Globulin: 2.7 g/dL (calc) (ref 1.9–3.7)
Glucose, Bld: 106 mg/dL — ABNORMAL HIGH (ref 65–99)
Potassium: 4.1 mmol/L (ref 3.5–5.3)
Sodium: 140 mmol/L (ref 135–146)
Total Bilirubin: 0.4 mg/dL (ref 0.2–1.2)
Total Protein: 6.8 g/dL (ref 6.1–8.1)

## 2021-01-28 LAB — IRON: Iron: 78 ug/dL (ref 45–160)

## 2021-02-17 ENCOUNTER — Other Ambulatory Visit: Payer: Self-pay

## 2021-02-17 ENCOUNTER — Ambulatory Visit (HOSPITAL_COMMUNITY)
Admission: RE | Admit: 2021-02-17 | Discharge: 2021-02-17 | Disposition: A | Discharge status: Home / Self Care | Payer: Medicare HMO | Source: Ambulatory Visit | Attending: Family Medicine | Admitting: Family Medicine

## 2021-02-17 DIAGNOSIS — M810 Age-related osteoporosis without current pathological fracture: Secondary | ICD-10-CM | POA: Diagnosis not present

## 2021-02-17 DIAGNOSIS — Z780 Asymptomatic menopausal state: Secondary | ICD-10-CM | POA: Insufficient documentation

## 2021-02-17 DIAGNOSIS — Z1231 Encounter for screening mammogram for malignant neoplasm of breast: Secondary | ICD-10-CM | POA: Diagnosis not present

## 2021-02-17 DIAGNOSIS — M8589 Other specified disorders of bone density and structure, multiple sites: Secondary | ICD-10-CM | POA: Diagnosis not present

## 2021-02-20 ENCOUNTER — Encounter: Payer: Self-pay | Admitting: *Deleted

## 2021-02-20 ENCOUNTER — Encounter: Payer: Self-pay | Admitting: Nurse Practitioner

## 2021-02-20 DIAGNOSIS — M810 Age-related osteoporosis without current pathological fracture: Secondary | ICD-10-CM | POA: Insufficient documentation

## 2021-03-13 ENCOUNTER — Ambulatory Visit (INDEPENDENT_AMBULATORY_CARE_PROVIDER_SITE_OTHER): Payer: Medicare HMO | Admitting: Nurse Practitioner

## 2021-03-13 ENCOUNTER — Encounter: Payer: Self-pay | Admitting: Nurse Practitioner

## 2021-03-13 ENCOUNTER — Other Ambulatory Visit: Payer: Self-pay

## 2021-03-13 VITALS — BP 134/82 | HR 78 | Temp 98.4°F | Ht 65.0 in | Wt 189.7 lb

## 2021-03-13 DIAGNOSIS — R7303 Prediabetes: Secondary | ICD-10-CM

## 2021-03-13 DIAGNOSIS — E7800 Pure hypercholesterolemia, unspecified: Secondary | ICD-10-CM | POA: Diagnosis not present

## 2021-03-13 DIAGNOSIS — M810 Age-related osteoporosis without current pathological fracture: Secondary | ICD-10-CM | POA: Diagnosis not present

## 2021-03-13 NOTE — Assessment & Plan Note (Addendum)
We discussed her DEXA scan results at length.  She does have osteoporosis in her right femur neck.  We discussed treatment options including bisphosphonates and injectables.  She would like to proceed with bisphosphonate, however her most recent calcium level was elevated.  I discussed this with her and asked her to stop all calcium supplementation for now.  And we will recheck calcium level in about 6 weeks.  If it is normal at that point, we can start on alendronate.  Fall prevention is also very important to prevent fractures associated with osteoporosis.  Follow-up in 6 weeks.

## 2021-03-13 NOTE — Progress Notes (Signed)
Subjective:    Patient ID: Megan [name], female    DOB: May 04, 1951, 70 y.o.   MRN: [id number]  HPI: Megan [name] is a 70 y.o. female presenting to discuss DEXA scan results.  Chief Complaint  Patient presents with  . Results    Here to discuss bone density scan. Does not take rx'd meds, dose natural remedies   OSTEOPOROSIS Patient is currently taking all herbal/supplement for medications.  She is interested in starting a medication for osteoporosis, but is wanting to discuss "natural" treatments first. Satisfied with current treatment?: Not currently on treatment Medication side effects: n/a Medication compliance: n/a Past osteoporosis medications/treatments:  Adequate calcium & vitamin D: yes Intolerance to bisphosphonates:no Weight bearing exercises: yes -walks around in her field at home, mows the grass, stays very active. Alcohol: Does not ever drink alcohol Smoking history: Never smoker  Allergies  Allergen Reactions  . Other Other (See Comments)    Nuts - pt states sometimes makes her gout flare up  . Hydrocodone     Makes her feel bad  . Naproxen     Makes her feel bad     Outpatient Encounter Medications as of 03/13/2021  Medication Sig  . cyanocobalamin (V-R VITAMIN B-12) 500 MCG tablet Take 1 tablet (500 mcg total) by mouth daily.  . Ferrous Sulfate (IRON) 325 (65 Fe) MG TABS Take by mouth as needed.  . Magnesium 250 MG TABS Take by mouth.  . Multiple Vitamin (MULTIVITAMIN WITH MINERALS) TABS tablet Take 1 tablet by mouth daily.  . NONFORMULARY OR COMPOUNDED ITEM Herbal pain medicaitons  . OVER THE COUNTER MEDICATION Joint supplement  . OVER THE COUNTER MEDICATION Lion's Mane Mushroom- brain function  . Pyridoxine HCl (VITAMIN B-6 PO) Take by mouth every other day.  . vitamin C (ASCORBIC ACID) 500 MG tablet Take 1 tablet (500 mg total) by mouth daily. (Patient taking differently: Take 500 mg by mouth as needed.)  . [DISCONTINUED] NON FORMULARY CBD cream   . [DISCONTINUED] NON FORMULARY chamomile  . [DISCONTINUED] pravastatin (PRAVACHOL) 20 MG tablet Take 1 tablet three times a week for cholesterol   No facility-administered encounter medications on file as of 03/13/2021.    Patient Active Problem List   Diagnosis Date Noted  . Osteoporosis 02/20/2021  . GERD (gastroesophageal reflux disease) 07/14/2019  . Acquired renal cyst of right kidney 07/14/2019  . Rheumatoid arthritis ([place]) 05/09/2019  . Arthritis of carpometacarpal [facility]) joint of right thumb 12/01/2018  . Medial epicondylitis of elbow, right 12/01/2018  . Abdominal bloating with cramps 12/29/2017  . Hypertension 05/19/2017  . DDD (degenerative disc disease), lumbar 05/19/2017  . Hyperlipidemia 05/19/2017  . Borderline diabetes 05/19/2017  . Iron deficiency 05/19/2017  . Hypothyroidism 05/19/2017  . Leukopenia 05/04/2016  . Acquired pes planus 03/01/2012  . Arthritis of right knee 03/01/2012  . Knee pain 02/25/2012    Past Medical History:  Diagnosis Date  . Gout   . High cholesterol   . Hypertension   . Kidney stone   . Leukopenia 05/04/2016  . Seasonal allergies     Relevant past medical, surgical, family and social history reviewed and updated as indicated. Interim medical history since our last visit reviewed.  Review of Systems Per HPI unless specifically indicated above     Objective:    BP 134/82   Pulse 78   Temp 98.4 F (36.9 C)   Ht 5\' 5"  (1.651 m)   Wt 189 lb 11.2 oz (86 kg)  SpO2 96%   BMI 31.57 kg/m   Wt Readings from Last 3 Encounters:  03/13/21 189 lb 11.2 oz (86 kg)  01/27/21 177 lb (80.3 kg)  11/19/20 171 lb (77.6 kg)    Physical Exam Vitals and nursing note reviewed.  Constitutional:      General: She is not in acute distress.    Appearance: Normal appearance. She is not toxic-appearing.  Musculoskeletal:        General: Normal range of motion.     Right lower leg: No edema.     Left lower leg: No edema.  Skin:    General:  Skin is warm and dry.     Capillary Refill: Capillary refill takes less than 2 seconds.     Coloration: Skin is not jaundiced or pale.     Findings: No erythema.  Neurological:     Mental Status: She is alert and oriented to person, place, and time.     Motor: No weakness.     Gait: Gait normal.  Psychiatric:        Mood and Affect: Mood normal.        Behavior: Behavior normal.        Thought Content: Thought content normal.        Judgment: Judgment normal.     Results for orders placed or performed in visit on 01/27/21  CBC with Differential/Platelet  Result Value Ref Range   WBC 4.0 3.8 - 10.8 Thousand/uL   RBC 4.22 3.80 - 5.10 Million/uL   Hemoglobin 12.8 11.7 - 15.5 g/dL   HCT 37.1 35.0 - 45.0 %   MCV 87.9 80.0 - 100.0 fL   MCH 30.3 27.0 - 33.0 pg   MCHC 34.5 32.0 - 36.0 g/dL   RDW 12.3 11.0 - 15.0 %   Platelets 226 140 - 400 Thousand/uL   MPV 12.2 7.5 - 12.5 fL   Neutro Abs 1,964 1,500 - 7,800 cells/uL   Lymphs Abs 1,376 850 - 3,900 cells/uL   Absolute Monocytes 424 200 - 950 cells/uL   Eosinophils Absolute 216 15 - 500 cells/uL   Basophils Absolute 20 0 - 200 cells/uL   Neutrophils Relative % 49.1 %   Total Lymphocyte 34.4 %   Monocytes Relative 10.6 %   Eosinophils Relative 5.4 %   Basophils Relative 0.5 %  Comprehensive metabolic panel  Result Value Ref Range   Glucose, Bld 106 (H) 65 - 99 mg/dL   BUN 17 7 - 25 mg/dL   Creat 0.78 0.60 - 0.93 mg/dL   BUN/Creatinine Ratio NOT APPLICABLE 6 - 22 (calc)   Sodium 140 135 - 146 mmol/L   Potassium 4.1 3.5 - 5.3 mmol/L   Chloride 104 98 - 110 mmol/L   CO2 28 20 - 32 mmol/L   Calcium 10.7 (H) 8.6 - 10.4 mg/dL   Total Protein 6.8 6.1 - 8.1 g/dL   Albumin 4.1 3.6 - 5.1 g/dL   Globulin 2.7 1.9 - 3.7 g/dL (calc)   AG Ratio 1.5 1.0 - 2.5 (calc)   Total Bilirubin 0.4 0.2 - 1.2 mg/dL   Alkaline phosphatase (APISO) 94 37 - 153 U/L   AST 18 10 - 35 U/L   ALT 10 6 - 29 U/L  Iron  Result Value Ref Range   Iron 78 45 -  160 mcg/dL      Assessment & Plan:   Problem List Items Addressed This Visit      Musculoskeletal and Integument   Osteoporosis  We discussed her DEXA scan results at length.  She does have osteoporosis in her right femur neck.  We discussed treatment options including bisphosphonates and injectables.  She would like to proceed with bisphosphonate, however her most recent calcium level was elevated.  I discussed this with her and asked her to stop all calcium supplementation for now.  And we will recheck calcium level in about 6 weeks.  If it is normal at that point, we can start on alendronate.  Fall prevention is also very important to prevent fractures associated with osteoporosis.  Follow-up in 6 weeks.        Other   Hyperlipidemia - Primary   Relevant Orders   Lipid Panel   Borderline diabetes   Relevant Orders   Hemoglobin A1c    Other Visit Diagnoses    Hypercalcemia       Relevant Orders   COMPLETE METABOLIC PANEL WITH GFR   PTH, Intact and Calcium       Follow up plan: Return in about 6 weeks (around 04/24/2021) for labs 1 week before.

## 2021-03-13 NOTE — Patient Instructions (Addendum)
Alendronate; Cholecalciferol Oral Tablets What is this medicine? ALENDRONATE; CHOLECALCIFEROL (a LEN droe nate; KOL e cal SIF er ol) slows calcium loss from bones. It treats osteoporosis. It may be used in other people at risk for bone loss. This medicine may be used for other purposes; ask your health care provider or pharmacist if you have questions. COMMON BRAND NAME(S): Fosamax Plus D What should I tell my health care provider before I take this medicine? They need to know if you have any of these conditions:  bleeding disorder  cancer  dental disease  difficulty swallowing  high levels of vitamin D in the blood  infection (fever, chills, cough, sore throat, pain or trouble passing urine)  kidney disease  low levels of calcium or other minerals in the blood  low red blood cell counts  receiving steroids like dexamethasone or prednisone  stomach or intestine problems  trouble sitting or standing for 30 minutes  an unusual or allergic reaction to alendronate, cholecalciferol, other drugs, foods, dyes or preservatives  pregnant or trying to get pregnant  breast-feeding How should I use this medicine? Take this drug by mouth with a full glass of water. Take it as directed on the prescription label at the same day of each week. Take the dose right after waking up. Do not eat or drink anything before taking it. Do not take it with any other drink except water. Do not chew or crush the tablet. After taking it, do not eat breakfast, drink, or take any other drugs or vitamins for at least 30 minutes. Sit or stand up for at least 30 minutes after you take it. Do not lie down. Keep taking it unless your health care provider tells you to stop. A special MedGuide will be given to you by the pharmacist with each prescription and refill. Be sure to read this information carefully each time. Talk to your health care provider about the use of this drug in children. Special care may be  needed. Overdosage: If you think you have taken too much of this medicine contact a poison control center or emergency room at once. NOTE: This medicine is only for you. Do not share this medicine with others. What if I miss a dose? If you take your drug once a day, skip it. Take your next dose at the scheduled time the next morning. Do not take two doses on the same day. If you take your drug once a week, take the missed dose on the morning after you remember. Do not take two doses on the same day. What may interact with this medicine?  antacids  aspirin and aspirin like drugs  calcium supplements, especially calcium with vitamin D  cholestyramine or colestipol  cimetidine, ranitidine, or other medicines used to decrease stomach acid  corticosteroids  iron supplements  magnesium supplements  mineral oil  NSAIDs, medicines for pain and inflammation, like ibuprofen or naproxen  orlistat  phenobarbital  phenytoin  phosphorous supplements  primidone  steroid medicines like prednisone or cortisone  thiazide diuretics  vitamins with minerals, especially with vitamin D This list may not describe all possible interactions. Give your health care provider a list of all the medicines, herbs, non-prescription drugs, or dietary supplements you use. Also tell them if you smoke, drink alcohol, or use illegal drugs. Some items may interact with your medicine. What should I watch for while using this medicine? Visit your health care provider for regular checks on your progress. It may be  some time before you see the benefit from this drug. Some people who take this drug have severe bone, joint, or muscle pain. This drug may also increase your risk for jaw problems or a broken thigh bone. Tell your health care provider right away if you have severe pain in your jaw, bones, joints, or muscles. Tell you health care provider if you have any pain that does not go away or that gets  worse. Tell your dentist and dental surgeon that you are taking this drug. You should not have major dental surgery while on this drug. See your dentist to have a dental exam and fix any dental problems before starting this drug. Take good care of your teeth while on this drug. Make sure you see your dentist for regular follow-up appointments. You should make sure you get enough calcium and vitamin D while you are taking this drug. Discuss the foods you eat and the vitamins you take with your health care provider. You may need blood work done while you are taking this drug. What side effects may I notice from receiving this medicine? Side effects that you should report to your doctor or health care provider as soon as possible:  allergic reactions (skin rash, itching or hives; swelling of the face, lips, or tongue)  bone pain  heartburn (burning feeling in chest, often after eating or when lying down)  jaw pain, especially after dental work  joint pain  low calcium levels (fast heartbeat; muscle cramps or pain; pain, tingling, or numbness in the hands or feet; seizures)  muscle pain  painful or difficulty swallowing  redness, blistering, peeling, or loosening of the skin, including inside the mouth  stomach bleeding (bloody or black, tarry stools; spitting up blood or brown material that looks like coffee grounds) Side effects that usually do not require medical attention (report to your doctor or health care provider if they continue or are bothersome):  constipation  diarrhea  nausea  stomach pain This list may not describe all possible side effects. Call your doctor for medical advice about side effects. You may report side effects to FDA at 1-800-FDA-1088. Where should I keep my medicine? Keep out of the reach of children and pets. Store at room temperature between 15 and 30 degrees C (59 and 86 degrees F). Protect from light and moisture. Keep this drug in the original  packaging until you are ready to take it. Throw away any unused drug after the expiration date. NOTE: This sheet is a summary. It may not cover all possible information. If you have questions about this medicine, talk to your doctor, pharmacist, or health care provider.  2021 Elsevier/Gold Standard (2019-08-31 08:14:01)   Food Choices for Gastroesophageal Reflux Disease, Adult When you have gastroesophageal reflux disease (GERD), the foods you eat and your eating habits are very important. Choosing the right foods can help ease your discomfort. Think about working with a food expert (dietitian) to help you make good choices. What are tips for following this plan? Reading food labels  Look for foods that are low in saturated fat. Foods that may help with your symptoms include: ? Foods that have less than 5% of daily value (DV) of fat. ? Foods that have 0 grams of trans fat. Cooking  Do not fry your food.  Cook your food by baking, steaming, grilling, or broiling. These are all methods that do not need a lot of fat for cooking.  To add flavor, try to use  herbs that are low in spice and acidity. Meal planning  Choose healthy foods that are low in fat, such as: ? Fruits and vegetables. ? Whole grains. ? Low-fat dairy products. ? Lean meats, fish, and poultry.  Eat small meals often instead of eating 3 large meals each day. Eat your meals slowly in a place where you are relaxed. Avoid bending over or lying down until 2-3 hours after eating.  Limit high-fat foods such as fatty meats or fried foods.  Limit your intake of fatty foods, such as oils, butter, and shortening.  Avoid the following as told by your doctor: ? Foods that cause symptoms. These may be different for different people. Keep a food diary to keep track of foods that cause symptoms. ? Alcohol. ? Drinking a lot of liquid with meals. ? Eating meals during the 2-3 hours before bed.   Lifestyle  Stay at a healthy  weight. Ask your doctor what weight is healthy for you. If you need to lose weight, work with your doctor to do so safely.  Exercise for at least 30 minutes on 5 or more days each week, or as told by your doctor.  Wear loose-fitting clothes.  Do not smoke or use any products that contain nicotine or tobacco. If you need help quitting, ask your doctor.  Sleep with the head of your bed higher than your feet. Use a wedge under the mattress or blocks under the bed frame to raise the head of the bed.  Chew sugar-free gum after meals. What foods should eat? Eat a healthy, well-balanced diet of fruits, vegetables, whole grains, low-fat dairy products, lean meats, fish, and poultry. Each person is different. Foods that may cause symptoms in one person may not cause any symptoms in another person. Work with your doctor to find foods that are safe for you. The items listed above may not be a complete list of what you can eat and drink. Contact a food expert for more options.   What foods should I avoid? Limiting some of these foods may help in managing the symptoms of GERD. Everyone is different. Talk with a food expert or your doctor to help you find the exact foods to avoid, if any. Fruits Any fruits prepared with added fat. Any fruits that cause symptoms. For some people, this may include citrus fruits, such as oranges, grapefruit, pineapple, and lemons. Vegetables Deep-fried vegetables. [place] fries. Any vegetables prepared with added fat. Any vegetables that cause symptoms. For some people, this may include tomatoes and tomato products, chili peppers, onions and garlic, and horseradish. Grains Pastries or quick breads with added fat. Meats and other proteins High-fat meats, such as fatty beef or pork, hot dogs, ribs, ham, sausage, salami, and bacon. Fried meat or protein, including fried fish and fried chicken. Nuts and nut butters, in large amounts. Dairy Whole milk and chocolate milk. Sour  cream. Cream. Ice cream. Cream cheese. Milkshakes. Fats and oils Butter. Margarine. Shortening. Ghee. Beverages Coffee and tea, with or without caffeine. Carbonated beverages. Sodas. Energy drinks. Fruit juice made with acidic fruits, such as orange or grapefruit. Tomato juice. Alcoholic drinks. Sweets and desserts Chocolate and cocoa. Donuts. Seasonings and condiments Pepper. Peppermint and spearmint. Added salt. Any condiments, herbs, or seasonings that cause symptoms. For some people, this may include curry, hot sauce, or vinegar-based salad dressings. The items listed above may not be a complete list of what you should not eat and drink. Contact a food expert for more  options. Questions to ask your doctor Diet and lifestyle changes are often the first steps that are taken to manage symptoms of GERD. If diet and lifestyle changes do not help, talk with your doctor about taking medicines. Where to find more information  International Foundation for Gastrointestinal Disorders: aboutgerd.org Summary  When you have GERD, food and lifestyle choices are very important in easing your symptoms.  Eat small meals often instead of 3 large meals a day. Eat your meals slowly and in a place where you are relaxed.  Avoid bending over or lying down until 2-3 hours after eating.  Limit high-fat foods such as fatty meats or fried foods. This information is not intended to replace advice given to you by your health care provider. Make sure you discuss any questions you have with your health care provider. Document Revised: 05/27/2020 Document Reviewed: 05/27/2020 Elsevier Patient Education  [place].

## 2021-04-08 DIAGNOSIS — M2550 Pain in unspecified joint: Secondary | ICD-10-CM | POA: Diagnosis not present

## 2021-04-08 DIAGNOSIS — M0609 Rheumatoid arthritis without rheumatoid factor, multiple sites: Secondary | ICD-10-CM | POA: Diagnosis not present

## 2021-04-08 DIAGNOSIS — M549 Dorsalgia, unspecified: Secondary | ICD-10-CM | POA: Diagnosis not present

## 2021-04-08 DIAGNOSIS — E663 Overweight: Secondary | ICD-10-CM | POA: Diagnosis not present

## 2021-04-08 DIAGNOSIS — Z6829 Body mass index (BMI) 29.0-29.9, adult: Secondary | ICD-10-CM | POA: Diagnosis not present

## 2021-04-23 ENCOUNTER — Ambulatory Visit: Payer: Medicare HMO | Admitting: Nurse Practitioner

## 2021-05-07 ENCOUNTER — Ambulatory Visit: Payer: Medicare HMO | Admitting: Orthopedic Surgery

## 2021-07-15 ENCOUNTER — Other Ambulatory Visit: Payer: Self-pay

## 2021-07-15 ENCOUNTER — Ambulatory Visit (INDEPENDENT_AMBULATORY_CARE_PROVIDER_SITE_OTHER): Payer: Medicare HMO | Admitting: Nurse Practitioner

## 2021-07-15 ENCOUNTER — Encounter: Payer: Self-pay | Admitting: Nurse Practitioner

## 2021-07-15 VITALS — BP 153/73 | HR 74 | Temp 98.4°F | Ht 64.0 in | Wt 175.0 lb

## 2021-07-15 DIAGNOSIS — R7303 Prediabetes: Secondary | ICD-10-CM

## 2021-07-15 DIAGNOSIS — E611 Iron deficiency: Secondary | ICD-10-CM | POA: Diagnosis not present

## 2021-07-15 DIAGNOSIS — M109 Gout, unspecified: Secondary | ICD-10-CM | POA: Diagnosis not present

## 2021-07-15 DIAGNOSIS — Z7689 Persons encountering health services in other specified circumstances: Secondary | ICD-10-CM

## 2021-07-15 DIAGNOSIS — E039 Hypothyroidism, unspecified: Secondary | ICD-10-CM | POA: Diagnosis not present

## 2021-07-15 DIAGNOSIS — E7800 Pure hypercholesterolemia, unspecified: Secondary | ICD-10-CM | POA: Diagnosis not present

## 2021-07-15 DIAGNOSIS — I10 Essential (primary) hypertension: Secondary | ICD-10-CM | POA: Diagnosis not present

## 2021-07-15 MED ORDER — COLCHICINE 0.6 MG PO TABS: ORAL_TABLET | ORAL | 0 refills | Status: DC

## 2021-07-15 NOTE — Assessment & Plan Note (Signed)
-  had a flare several weeks ago and states she was told she could take something for it -will check uric acid level to determine if allopurinol is appropriate

## 2021-07-15 NOTE — Assessment & Plan Note (Signed)
-  will check lipid panel with next set of labs -took atorvastatin in the past

## 2021-07-15 NOTE — Assessment & Plan Note (Signed)
-  takes iron supplement -will check iron panel with next set of labs

## 2021-07-15 NOTE — Progress Notes (Signed)
New Patient Office Visit  Subjective:  Patient ID: Megan [name], female    DOB: 04/09/1951  Age: 70 y.o. MRN: [id number]  CC:  Chief Complaint  Patient presents with   New Patient (Initial Visit)    Here to establish care. Complains of having gout in R big toe recently, that has not gotten completely better.     HPI Megan [name] presents for new patient visit. Transferring care from Dr. Buelah Manis with [facility]. Last physical was 01/27/21. Last labs were drawn at that time.  She had gout flare in top of right foot a few weeks ago. It still hurts a little bit.  She has hx of rheumatoid arthritis and is followed by rheumatology.  Past Medical History:  Diagnosis Date   Gout    High cholesterol    Hypertension    Kidney stone    Leukopenia 05/04/2016   Rheumatoid arthritis ([place])    Seasonal allergies    Thyroid disease     Past Surgical History:  Procedure Laterality Date   BILATERAL KNEE ARTHROSCOPY     COLONOSCOPY N/A 07/30/2014   Procedure: COLONOSCOPY;  Surgeon: Danie Binder, MD;  Location: [facility];  Service: Endoscopy;  Laterality: N/A;  9:30 AM   high cholesterol     Right knee osteotomy      Family History  Problem Relation Age of Onset   Hypertension Mother    Asthma Mother    Other Father        Rare blood disease   Hypertension Brother    Colon cancer Paternal Uncle    Bladder Cancer Neg Hx    Kidney cancer Neg Hx     Social History   Socioeconomic History   Marital status: Married    Spouse name: Not on file   Number of children: 4   Years of education: Not on file   Highest education level: Not on file  Occupational History   Occupation: Retired; Works PT in [employer] at [employer] in [place] Use   Smoking status: Never   Smokeless tobacco: Never  Vaping Use   Vaping Use: Never used  Substance and Sexual Activity   Alcohol use: No   Drug use: No    Comment: used CBD oil   Sexual activity: Yes  Other Topics Concern   Not  on file  Social History Narrative   3 biological children and 1 adopted   Social Determinants of Health   Financial Resource Strain: Not on file  Food Insecurity: Not on file  Transportation Needs: Not on file  Physical Activity: Not on file  Stress: Not on file  Social Connections: Not on file  Intimate Partner Violence: Not on file    ROS Review of Systems  Constitutional: Negative.   Respiratory: Negative.    Cardiovascular: Negative.   Musculoskeletal:        Right foot pain for several weeks  Psychiatric/Behavioral: Negative.     Objective:   Today's Vitals: BP (!) 153/73 (BP Location: Left Arm, Patient Position: Sitting, Cuff Size: Large)   Pulse 74   Temp 98.4 F (36.9 C) (Oral)   Ht 5' 4" (1.626 m)   Wt 175 lb (79.4 kg)   SpO2 96%   BMI 30.04 kg/m   Physical Exam Constitutional:      Appearance: Normal appearance.  Cardiovascular:     Rate and Rhythm: Normal rate and regular rhythm.     Pulses: Normal pulses.  Heart sounds: Normal heart sounds.  Pulmonary:     Effort: Pulmonary effort is normal.     Breath sounds: Normal breath sounds.  Musculoskeletal:        General: Deformity present.     Comments: Right foot pain; deformity to right foot has been present for some time  Neurological:     Mental Status: She is alert.  Psychiatric:        Mood and Affect: Mood normal.        Behavior: Behavior normal.        Thought Content: Thought content normal.        Judgment: Judgment normal.    Assessment & Plan:   Problem List Items Addressed This Visit       Cardiovascular and Mediastinum   Hypertension    -BP elevated today, but she states she gets nervous at doctor's office -she states she has been taking celery seed supplement, and her home BPs have been great        Endocrine   Hypothyroidism   Relevant Orders   TSH     Other   Hyperlipidemia    -will check lipid panel with next set of labs -took atorvastatin in the past       Relevant Orders   CBC with Differential/Platelet   CMP14+EGFR   Lipid Panel With LDL/HDL Ratio   Borderline diabetes    -will check A1c with labs      Relevant Orders   Hemoglobin A1c   Iron deficiency    -takes iron supplement -will check iron panel with next set of labs      Relevant Orders   Iron, TIBC and Ferritin Panel   Gout    -had a flare several weeks ago and states she was told she could take something for it -will check uric acid level to determine if allopurinol is appropriate      Relevant Medications   colchicine 0.6 MG tablet   Other Relevant Orders   Uric acid   Other Visit Diagnoses     Encounter to establish care    -  Primary   Relevant Orders   CBC with Differential/Platelet   CMP14+EGFR   Lipid Panel With LDL/HDL Ratio   TSH   Iron, TIBC and Ferritin Panel   Uric acid       Outpatient Encounter Medications as of 07/15/2021  Medication Sig   colchicine 0.6 MG tablet Take 2 tablets at onset of gout flare; may take one additional tablet an hour after the first dose.   cyanocobalamin (V-R VITAMIN B-12) 500 MCG tablet Take 1 tablet (500 mcg total) by mouth daily.   Ferrous Sulfate (IRON) 325 (65 Fe) MG TABS Take by mouth as needed.   Magnesium 250 MG TABS Take by mouth.   Multiple Vitamin (MULTIVITAMIN WITH MINERALS) TABS tablet Take 1 tablet by mouth daily.   NONFORMULARY OR COMPOUNDED ITEM Herbal pain medicaitons   OVER THE COUNTER MEDICATION Joint supplement   OVER THE COUNTER MEDICATION [place] Mushroom- brain function   Pyridoxine HCl (VITAMIN B-6 PO) Take by mouth every other day.   UNABLE TO FIND Med Name: Celery seed tab 1,057m. Takes once weekly.   vitamin C (ASCORBIC ACID) 500 MG tablet Take 1 tablet (500 mg total) by mouth daily. (Patient taking differently: Take 500 mg by mouth as needed.)   No facility-administered encounter medications on file as of 07/15/2021.    Follow-up: Return in about 1 month (around  08/15/2021) for Lab  follow-up (thyroid, lipids, HTN, gout).   Noreene Larsson, NP

## 2021-07-15 NOTE — Assessment & Plan Note (Signed)
-  will check A1c with labs

## 2021-07-15 NOTE — Assessment & Plan Note (Signed)
-  BP elevated today, but she states she gets nervous at doctor's office -she states she has been taking celery seed supplement, and her home BPs have been great

## 2021-07-15 NOTE — Patient Instructions (Signed)
Please have fasting labs drawn 2-3 days prior to your appointment so we can discuss the results during your office visit.  

## 2021-07-23 ENCOUNTER — Telehealth: Payer: Self-pay | Admitting: Nurse Practitioner

## 2021-07-23 ENCOUNTER — Other Ambulatory Visit: Payer: Self-pay | Admitting: Nurse Practitioner

## 2021-07-23 DIAGNOSIS — M109 Gout, unspecified: Secondary | ICD-10-CM

## 2021-07-23 MED ORDER — PREDNISONE 20 MG PO TABS: 40.0000 mg | ORAL_TABLET | Freq: Every day | ORAL | 0 refills | Status: DC

## 2021-07-23 NOTE — Telephone Encounter (Signed)
Patient called and is requesting something to be called in for her foot.

## 2021-07-23 NOTE — Telephone Encounter (Signed)
Has gout on the right foot, colchicine didn't work for her. Pt wants Prednisone, works for her when she has episodes like this. Uses  [facility].  thanks

## 2021-07-23 NOTE — Telephone Encounter (Signed)
I called patient and told her that prednisone hs been sent to the pharmacy for her.

## 2021-07-23 NOTE — Telephone Encounter (Signed)
I sent in prednisone for her.

## 2021-07-31 ENCOUNTER — Other Ambulatory Visit: Payer: Self-pay

## 2021-07-31 ENCOUNTER — Ambulatory Visit (INDEPENDENT_AMBULATORY_CARE_PROVIDER_SITE_OTHER): Payer: Medicare HMO | Admitting: Nurse Practitioner

## 2021-07-31 ENCOUNTER — Encounter: Payer: Self-pay | Admitting: Nurse Practitioner

## 2021-07-31 VITALS — BP 154/78 | HR 76 | Temp 98.2°F | Ht 64.0 in | Wt 176.0 lb

## 2021-07-31 DIAGNOSIS — I10 Essential (primary) hypertension: Secondary | ICD-10-CM

## 2021-07-31 MED ORDER — AMLODIPINE BESYLATE 5 MG PO TABS: 5.0000 mg | ORAL_TABLET | Freq: Every day | ORAL | 1 refills | Status: DC

## 2021-07-31 NOTE — Assessment & Plan Note (Addendum)
BP Readings from Last 3 Encounters:  07/31/21 (!) 154/78  07/15/21 (!) 153/73  03/13/21 134/82   -Rx. Amlodipine -she states her BP is labile; hopefully treating BP will resolve other symptoms -we discussed red flags including signs of stroke, and she states she hasn't had any facial droop, unilateral weakness, or trouble speaking -she stopped taking her herbal supplements

## 2021-07-31 NOTE — Progress Notes (Signed)
Acute Office Visit  Subjective:    Patient ID: Megan [name], female    DOB: 04/19/1951, 70 y.o.   MRN: [id number]  Chief Complaint  Patient presents with   Hypertension    She has been checking her bp at home and it has been elevated since her last visit.     Hypertension Associated symptoms include headaches.  Patient is in today for BP check. SHe states her home BP has been elevated since her last visit. Some readings had SBP in the 200s. She states she feels "drunk".  She states that she has episodes of dizziness, and her BP is very high when has these.  Past Medical History:  Diagnosis Date   Gout    High cholesterol    Hypertension    Kidney stone    Leukopenia 05/04/2016   Rheumatoid arthritis ([place])    Seasonal allergies    Thyroid disease     Past Surgical History:  Procedure Laterality Date   BILATERAL KNEE ARTHROSCOPY     COLONOSCOPY N/A 07/30/2014   Procedure: COLONOSCOPY;  Surgeon: Danie Binder, MD;  [facility];  Service: Endoscopy;  Laterality: N/A;  9:30 AM   high cholesterol     Right knee osteotomy      Family History  Problem Relation Age of Onset   Hypertension Mother    Asthma Mother    Other Father        Rare blood disease   Hypertension Brother    Colon cancer Paternal Uncle    Bladder Cancer Neg Hx    Kidney cancer Neg Hx     Social History   Socioeconomic History   Marital status: Married    Spouse name: Not on file   Number of children: 4   Years of education: Not on file   Highest education level: Not on file  Occupational History   Occupation: Retired; Works PT in [employer] in [place] Use   Smoking status: Never   Smokeless tobacco: Never  Vaping Use   Vaping Use: Never used  Substance and Sexual Activity   Alcohol use: No   Drug use: No    Comment: used CBD oil   Sexual activity: Yes  Other Topics Concern   Not on file  Social History Narrative   3 biological children and 1  adopted   Social Determinants of Health   Financial Resource Strain: Not on file  Food Insecurity: Not on file  Transportation Needs: Not on file  Physical Activity: Not on file  Stress: Not on file  Social Connections: Not on file  Intimate Partner Violence: Not on file    Outpatient Medications Prior to Visit  Medication Sig Dispense Refill   [place] Name: Celery seed tab 1,'000mg'$ . Takes once weekly.     colchicine 0.6 MG tablet Take 2 tablets at onset of gout flare; may take one additional tablet an hour after the first dose. (Patient not taking: Reported on 07/31/2021) 12 tablet 0   cyanocobalamin (V-R VITAMIN B-12) 500 MCG tablet Take 1 tablet (500 mcg total) by mouth daily. (Patient not taking: Reported on 07/31/2021)     Ferrous Sulfate (IRON) 325 (65 Fe) MG TABS Take by mouth as needed. (Patient not taking: Reported on 07/31/2021)     Magnesium 250 MG TABS Take by mouth. (Patient not taking: Reported on 07/31/2021)     Multiple Vitamin (MULTIVITAMIN WITH MINERALS) TABS tablet Take  1 tablet by mouth daily. (Patient not taking: Reported on 07/31/2021)     NONFORMULARY OR COMPOUNDED ITEM Herbal pain medicaitons (Patient not taking: Reported on 07/31/2021)     OVER THE COUNTER MEDICATION Joint supplement (Patient not taking: Reported on 07/31/2021)     OVER THE COUNTER MEDICATION [place] Mushroom- brain function (Patient not taking: Reported on 07/31/2021)     predniSONE (DELTASONE) 20 MG tablet Take 2 tablets (40 mg total) by mouth daily with breakfast. (Patient not taking: Reported on 07/31/2021) 10 tablet 0   Pyridoxine HCl (VITAMIN B-6 PO) Take by mouth every other day. (Patient not taking: Reported on 07/31/2021)     vitamin C (ASCORBIC ACID) 500 MG tablet Take 1 tablet (500 mg total) by mouth daily. (Patient not taking: Reported on 07/31/2021)     No facility-administered medications prior to visit.    Allergies  Allergen Reactions   Other Other (See Comments)    Nuts - pt states  sometimes makes her gout flare up   Hydrocodone     Makes her feel bad   Naproxen     Makes her feel bad     Review of Systems  Constitutional: Negative.   Respiratory: Negative.    Cardiovascular:        Elevated BP  Neurological:  Positive for dizziness and headaches. Negative for facial asymmetry, speech difficulty, weakness and numbness.  Psychiatric/Behavioral: Negative.        Objective:    Physical Exam Constitutional:      Appearance: Normal appearance.  Cardiovascular:     Rate and Rhythm: Normal rate and regular rhythm.     Pulses: Normal pulses.     Heart sounds: Normal heart sounds.  Pulmonary:     Effort: Pulmonary effort is normal.     Breath sounds: Normal breath sounds.  Neurological:     General: No focal deficit present.     Mental Status: She is alert and oriented to person, place, and time.     Cranial Nerves: No cranial nerve deficit.     Sensory: No sensory deficit.     Motor: No weakness.     Gait: Gait normal.  Psychiatric:        Mood and Affect: Mood normal.        Behavior: Behavior normal.        Thought Content: Thought content normal.        Judgment: Judgment normal.    BP (!) 154/78 (BP Location: Left Arm, Patient Position: Sitting, Cuff Size: Large)   Pulse 76   Temp 98.2 F (36.8 C) (Oral)   Ht '5\' 4"'$  (1.626 m)   Wt 176 lb (79.8 kg)   SpO2 96%   BMI 30.21 kg/m  Wt Readings from Last 3 Encounters:  07/31/21 176 lb (79.8 kg)  07/15/21 175 lb (79.4 kg)  03/13/21 189 lb 11.2 oz (86 kg)    Health Maintenance Due  Topic Date Due   Zoster Vaccines- Shingrix (1 of 2) Never done    There are no preventive care reminders to display for this patient.   Lab Results  Component Value Date   TSH 1.73 11/19/2020   Lab Results  Component Value Date   WBC 4.0 01/27/2021   HGB 12.8 01/27/2021   HCT 37.1 01/27/2021   MCV 87.9 01/27/2021   PLT 226 01/27/2021   Lab Results  Component Value Date   NA 140 01/27/2021   K 4.1  01/27/2021   CO2  28 01/27/2021   GLUCOSE 106 (H) 01/27/2021   BUN 17 01/27/2021   CREATININE 0.78 01/27/2021   BILITOT 0.4 01/27/2021   ALKPHOS 75 07/16/2017   AST 18 01/27/2021   ALT 10 01/27/2021   PROT 6.8 01/27/2021   ALBUMIN 3.9 07/16/2017   CALCIUM 10.7 (H) 01/27/2021   ANIONGAP 8 07/16/2017   Lab Results  Component Value Date   CHOL 257 (H) 11/19/2020   Lab Results  Component Value Date   HDL 93 11/19/2020   Lab Results  Component Value Date   LDLCALC 148 (H) 11/19/2020   Lab Results  Component Value Date   TRIG 67 11/19/2020   Lab Results  Component Value Date   CHOLHDL 2.8 11/19/2020   Lab Results  Component Value Date   HGBA1C 5.9 (H) 11/19/2020       Assessment & Plan:   Problem List Items Addressed This Visit       Cardiovascular and Mediastinum   Hypertension - Primary    BP Readings from Last 3 Encounters:  07/31/21 (!) 154/78  07/15/21 (!) 153/73  03/13/21 134/82  -Rx. Amlodipine -she states her BP is labile; hopefully treating BP will resolve other symptoms -we discussed red flags including signs of stroke, and she states she hasn't had any facial droop, unilateral weakness, or trouble speaking -she stopped taking her herbal supplements      Relevant Medications   amLODipine (NORVASC) 5 MG tablet     Meds ordered this encounter  Medications   amLODipine (NORVASC) 5 MG tablet    Sig: Take 1 tablet (5 mg total) by mouth daily.    Dispense:  90 tablet    Refill:  [place], NP

## 2021-07-31 NOTE — Patient Instructions (Signed)
Continue to check BP in the AM and PM at home. Bring a log with you to your next appointment, and we will review the numbers.  If you have any signs of stroke that we discussed, go to the emergency department immediately for evaluation and treatment.

## 2021-08-19 DIAGNOSIS — E7800 Pure hypercholesterolemia, unspecified: Secondary | ICD-10-CM | POA: Diagnosis not present

## 2021-08-19 DIAGNOSIS — E039 Hypothyroidism, unspecified: Secondary | ICD-10-CM | POA: Diagnosis not present

## 2021-08-19 DIAGNOSIS — Z7689 Persons encountering health services in other specified circumstances: Secondary | ICD-10-CM | POA: Diagnosis not present

## 2021-08-19 DIAGNOSIS — M109 Gout, unspecified: Secondary | ICD-10-CM | POA: Diagnosis not present

## 2021-08-19 DIAGNOSIS — E611 Iron deficiency: Secondary | ICD-10-CM | POA: Diagnosis not present

## 2021-08-19 DIAGNOSIS — R7303 Prediabetes: Secondary | ICD-10-CM | POA: Diagnosis not present

## 2021-08-20 LAB — HEMOGLOBIN A1C
Est. average glucose Bld gHb Est-mCnc: 126 mg/dL
Hgb A1c MFr Bld: 6 % — ABNORMAL HIGH (ref 4.8–5.6)

## 2021-08-20 LAB — CBC WITH DIFFERENTIAL/PLATELET
Basophils Absolute: 0 10*3/uL (ref 0.0–0.2)
Basos: 0 %
EOS (ABSOLUTE): 0.3 10*3/uL (ref 0.0–0.4)
Eos: 8 %
Hematocrit: 35.8 % (ref 34.0–46.6)
Hemoglobin: 12.2 g/dL (ref 11.1–15.9)
Immature Grans (Abs): 0 10*3/uL (ref 0.0–0.1)
Immature Granulocytes: 0 %
Lymphocytes Absolute: 1.5 10*3/uL (ref 0.7–3.1)
Lymphs: 39 %
MCH: 29.9 pg (ref 26.6–33.0)
MCHC: 34.1 g/dL (ref 31.5–35.7)
MCV: 88 fL (ref 79–97)
Monocytes Absolute: 0.5 10*3/uL (ref 0.1–0.9)
Monocytes: 11 %
Neutrophils Absolute: 1.7 10*3/uL (ref 1.4–7.0)
Neutrophils: 42 %
Platelets: 244 10*3/uL (ref 150–450)
RBC: 4.08 x10E6/uL (ref 3.77–5.28)
RDW: 12.5 % (ref 11.7–15.4)
WBC: 4 10*3/uL (ref 3.4–10.8)

## 2021-08-20 LAB — IRON,TIBC AND FERRITIN PANEL
Ferritin: 258 ng/mL — ABNORMAL HIGH (ref 15–150)
Iron Saturation: 21 % (ref 15–55)
Iron: 63 ug/dL (ref 27–139)
Total Iron Binding Capacity: 301 ug/dL (ref 250–450)
UIBC: 238 ug/dL (ref 118–369)

## 2021-08-20 LAB — CMP14+EGFR
ALT: 12 IU/L (ref 0–32)
AST: 20 IU/L (ref 0–40)
Albumin/Globulin Ratio: 2 (ref 1.2–2.2)
Albumin: 4.5 g/dL (ref 3.7–4.7)
Alkaline Phosphatase: 102 IU/L (ref 44–121)
BUN/Creatinine Ratio: 14 (ref 12–28)
BUN: 10 mg/dL (ref 8–27)
Bilirubin Total: 0.3 mg/dL (ref 0.0–1.2)
CO2: 26 mmol/L (ref 20–29)
Calcium: 10.3 mg/dL (ref 8.7–10.3)
Chloride: 103 mmol/L (ref 96–106)
Creatinine, Ser: 0.73 mg/dL (ref 0.57–1.00)
Globulin, Total: 2.3 g/dL (ref 1.5–4.5)
Glucose: 92 mg/dL (ref 65–99)
Potassium: 4 mmol/L (ref 3.5–5.2)
Sodium: 141 mmol/L (ref 134–144)
Total Protein: 6.8 g/dL (ref 6.0–8.5)
eGFR: 88 mL/min/{1.73_m2} (ref >59)

## 2021-08-20 LAB — LIPID PANEL WITH LDL/HDL RATIO
Cholesterol, Total: 263 mg/dL — ABNORMAL HIGH (ref 100–199)
HDL: 97 mg/dL (ref >39)
LDL Chol Calc (NIH): 156 mg/dL — ABNORMAL HIGH (ref 0–99)
LDL/HDL Ratio: 1.6 ratio (ref 0.0–3.2)
Triglycerides: 65 mg/dL (ref 0–149)
VLDL Cholesterol Cal: 10 mg/dL (ref 5–40)

## 2021-08-20 LAB — URIC ACID: Uric Acid: 5.9 mg/dL (ref 3.1–7.9)

## 2021-08-20 LAB — TSH: TSH: 1.73 u[IU]/mL (ref 0.450–4.500)

## 2021-08-20 NOTE — Progress Notes (Signed)
-  LDL and ferritin elevated; I will address these with her at the appt tomorrow -no need to call

## 2021-08-21 ENCOUNTER — Other Ambulatory Visit: Payer: Self-pay

## 2021-08-21 ENCOUNTER — Encounter: Payer: Self-pay | Admitting: Nurse Practitioner

## 2021-08-21 ENCOUNTER — Ambulatory Visit (INDEPENDENT_AMBULATORY_CARE_PROVIDER_SITE_OTHER): Payer: Medicare HMO | Admitting: Nurse Practitioner

## 2021-08-21 DIAGNOSIS — I10 Essential (primary) hypertension: Secondary | ICD-10-CM

## 2021-08-21 DIAGNOSIS — D700 Congenital agranulocytosis: Secondary | ICD-10-CM

## 2021-08-21 DIAGNOSIS — E039 Hypothyroidism, unspecified: Secondary | ICD-10-CM

## 2021-08-21 DIAGNOSIS — E7800 Pure hypercholesterolemia, unspecified: Secondary | ICD-10-CM

## 2021-08-21 DIAGNOSIS — I499 Cardiac arrhythmia, unspecified: Secondary | ICD-10-CM | POA: Diagnosis not present

## 2021-08-21 DIAGNOSIS — R7303 Prediabetes: Secondary | ICD-10-CM | POA: Diagnosis not present

## 2021-08-21 DIAGNOSIS — E611 Iron deficiency: Secondary | ICD-10-CM | POA: Diagnosis not present

## 2021-08-21 MED ORDER — ROSUVASTATIN CALCIUM 20 MG PO TABS: 20.0000 mg | ORAL_TABLET | Freq: Every day | ORAL | 3 refills | Status: DC

## 2021-08-21 NOTE — Assessment & Plan Note (Signed)
Lab Results  Component Value Date   CHOL 263 (H) 08/19/2021   HDL 97 08/19/2021   LDLCALC 156 (H) 08/19/2021   TRIG 65 08/19/2021   CHOLHDL 2.8 11/19/2020  -Rx. crestor -rpt labs in 3 months

## 2021-08-21 NOTE — Assessment & Plan Note (Addendum)
-  irregular rhythm on exam -EKG today shows sinus rhythm with 1st degree AV block and occasional PVCs

## 2021-08-21 NOTE — Addendum Note (Signed)
Addended by: Demetrius Revel on: 08/21/2021 02:17 PM   Modules accepted: Orders

## 2021-08-21 NOTE — Assessment & Plan Note (Signed)
-  WBC are WNL today

## 2021-08-21 NOTE — Progress Notes (Signed)
Acute Office Visit  Subjective:    Patient ID: Megan [name], female    DOB: 1951/08/24, 70 y.o.   MRN: [id number]  Chief Complaint  Patient presents with   Hypertension    Follow up    Hypertension  Patient is in today for lab follow-up for HTN and HLD.  At her last OV, we started amlodipine. No side effects reported today. She recently stopped taking a celery seed supplement because her BP was getting low.  She as taking red yeast rice previously for her cholesterol, but she is no longer taking that.  Past Medical History:  Diagnosis Date   Gout    High cholesterol    Hypertension    Kidney stone    Leukopenia 05/04/2016   Rheumatoid arthritis ([place])    Seasonal allergies    Thyroid disease     Past Surgical History:  Procedure Laterality Date   BILATERAL KNEE ARTHROSCOPY     COLONOSCOPY N/A 07/30/2014   Procedure: COLONOSCOPY;  Surgeon: Danie Binder, MD;  [facility];  Service: Endoscopy;  Laterality: N/A;  9:30 AM   high cholesterol     Right knee osteotomy      Family History  Problem Relation Age of Onset   Hypertension Mother    Asthma Mother    Other Father        Rare blood disease   Hypertension Brother    Colon cancer Paternal Uncle    Bladder Cancer Neg Hx    Kidney cancer Neg Hx     Social History   Socioeconomic History   Marital status: Married    Spouse name: Not on file   Number of children: 4   Years of education: Not on file   Highest education level: Not on file  Occupational History   Occupation: Retired; Works PT in [employer] in [place] Use   Smoking status: Never   Smokeless tobacco: Never  Vaping Use   Vaping Use: Never used  Substance and Sexual Activity   Alcohol use: No   Drug use: No    Comment: used CBD oil   Sexual activity: Yes  Other Topics Concern   Not on file  Social History Narrative   3 biological children and 1 adopted   Social Determinants of Health   Financial  Resource Strain: Not on file  Food Insecurity: Not on file  Transportation Needs: Not on file  Physical Activity: Not on file  Stress: Not on file  Social Connections: Not on file  Intimate Partner Violence: Not on file    Outpatient Medications Prior to Visit  Medication Sig Dispense Refill   amLODipine (NORVASC) 5 MG tablet Take 1 tablet (5 mg total) by mouth daily. 90 tablet 1   UNABLE TO FIND Med Name: Celery seed tab 1,060m. Takes once weekly. (Patient not taking: Reported on 08/21/2021)     No facility-administered medications prior to visit.    Allergies  Allergen Reactions   Other Other (See Comments)    Nuts - pt states sometimes makes her gout flare up   Hydrocodone     Makes her feel bad   Naproxen     Makes her feel bad     Review of Systems  Constitutional: Negative.   Respiratory: Negative.    Cardiovascular: Negative.   Musculoskeletal: Negative.   Psychiatric/Behavioral: Negative.        Objective:    Physical Exam Constitutional:  Appearance: Normal appearance.  Cardiovascular:     Rate and Rhythm: Normal rate. Rhythm irregular.     Pulses: Normal pulses.     Heart sounds: Normal heart sounds.  Pulmonary:     Effort: Pulmonary effort is normal.     Breath sounds: Normal breath sounds.  Musculoskeletal:        General: Normal range of motion.  Neurological:     Mental Status: She is alert.  Psychiatric:        Mood and Affect: Mood normal.        Behavior: Behavior normal.        Thought Content: Thought content normal.        Judgment: Judgment normal.    BP 117/67 (BP Location: Left Arm, Patient Position: Sitting, Cuff Size: Large)   Pulse 79   Temp 98.7 F (37.1 C) (Oral)   Ht _0  (1.626 m)   Wt 179 lb (81.2 kg)   SpO2 95%   BMI 30.73 kg/m  Wt Readings from Last 3 Encounters:  08/21/21 179 lb (81.2 kg)  07/31/21 176 lb (79.8 kg)  07/15/21 175 lb (79.4 kg)    Health Maintenance Due  Topic Date Due   Zoster Vaccines-  Shingrix (1 of 2) Never done    There are no preventive care reminders to display for this patient.   Lab Results  Component Value Date   TSH 1.730 08/19/2021   Lab Results  Component Value Date   WBC 4.0 08/19/2021   HGB 12.2 08/19/2021   HCT 35.8 08/19/2021   MCV 88 08/19/2021   PLT 244 08/19/2021   Lab Results  Component Value Date   NA 141 08/19/2021   K 4.0 08/19/2021   CO2 26 08/19/2021   GLUCOSE 92 08/19/2021   BUN 10 08/19/2021   CREATININE 0.73 08/19/2021   BILITOT 0.3 08/19/2021   ALKPHOS 102 08/19/2021   AST 20 08/19/2021   ALT 12 08/19/2021   PROT 6.8 08/19/2021   ALBUMIN 4.5 08/19/2021   CALCIUM 10.3 08/19/2021   ANIONGAP 8 07/16/2017   EGFR 88 08/19/2021   Lab Results  Component Value Date   CHOL 263 (H) 08/19/2021   Lab Results  Component Value Date   HDL 97 08/19/2021   Lab Results  Component Value Date   LDLCALC 156 (H) 08/19/2021   Lab Results  Component Value Date   TRIG 65 08/19/2021   Lab Results  Component Value Date   CHOLHDL 2.8 11/19/2020   Lab Results  Component Value Date   HGBA1C 6.0 (H) 08/19/2021       Assessment & Plan:   Problem List Items Addressed This Visit       Cardiovascular and Mediastinum   Hypertension    BP Readings from Last 3 Encounters:  08/21/21 117/67  07/31/21 (!) 154/78  07/15/21 (!) 153/73  -BP well controlled today -continue current meds      Relevant Orders   CBC with Differential/Platelet   CMP14+EGFR   Lipid Panel With LDL/HDL Ratio     Endocrine   Hypothyroidism    -TSH looks great without meds -check TSH yearly Lab Results  Component Value Date   TSH 1.730 08/19/2021           Other   Leukopenia    -WBC are WNL today      Prediabetes    Lab Results  Component Value Date   HGBA1C 6.0 (H) 08/19/2021  -continue to exercise -no meds  at this time      Relevant Orders   Hemoglobin A1c   Iron deficiency    - Lab Results  Component Value Date   IRON 63  08/19/2021   TIBC 301 08/19/2021   FERRITIN 258 (H) 08/19/2021  -we discussed stopping iron supplement since her ferritin level is about 2x normal, and she agreed to do this      Relevant Orders   CBC with Differential/Platelet   Iron, TIBC and Ferritin Panel   Irregular heart beat    -irregular rhythm on exam -EKG today shows        No orders of the defined types were placed in this encounter.    Noreene Larsson, NP

## 2021-08-21 NOTE — Assessment & Plan Note (Signed)
-   Lab Results  Component Value Date   IRON 63 08/19/2021   TIBC 301 08/19/2021   FERRITIN 258 (H) 08/19/2021   -we discussed stopping iron supplement since her ferritin level is about 2x normal, and she agreed to do this

## 2021-08-21 NOTE — Patient Instructions (Signed)
Please have fasting labs drawn 2-3 days prior to your appointment so we can discuss the results during your office visit.  

## 2021-08-21 NOTE — Assessment & Plan Note (Signed)
-  TSH looks great without meds -check TSH yearly Lab Results  Component Value Date   TSH 1.730 08/19/2021

## 2021-08-21 NOTE — Assessment & Plan Note (Signed)
Lab Results  Component Value Date   HGBA1C 6.0 (H) 08/19/2021   -continue to exercise -no meds at this time

## 2021-08-21 NOTE — Assessment & Plan Note (Signed)
BP Readings from Last 3 Encounters:  08/21/21 117/67  07/31/21 (!) 154/78  07/15/21 (!) 153/73   -BP well controlled today -continue current meds

## 2021-10-16 DIAGNOSIS — M0609 Rheumatoid arthritis without rheumatoid factor, multiple sites: Secondary | ICD-10-CM | POA: Diagnosis not present

## 2021-10-16 DIAGNOSIS — E669 Obesity, unspecified: Secondary | ICD-10-CM | POA: Diagnosis not present

## 2021-10-16 DIAGNOSIS — M549 Dorsalgia, unspecified: Secondary | ICD-10-CM | POA: Diagnosis not present

## 2021-10-16 DIAGNOSIS — Z6830 Body mass index (BMI) 30.0-30.9, adult: Secondary | ICD-10-CM | POA: Diagnosis not present

## 2021-10-16 DIAGNOSIS — M2550 Pain in unspecified joint: Secondary | ICD-10-CM | POA: Diagnosis not present

## 2021-10-22 ENCOUNTER — Telehealth: Payer: Self-pay | Admitting: Nurse Practitioner

## 2021-10-22 NOTE — Telephone Encounter (Signed)
Called and spoke with patient, she did not have the medication and was unsure of the name. She is followed bu Rheumatology and will reach out to them seeing as she was just seen by them yesterday.

## 2021-10-22 NOTE — Telephone Encounter (Signed)
Pt called in for a refill on a steroid that was previously given for Rheumatoid arthritis    Colchicine ?   Can call pt and discuss   If refilled send to  [facility] In [place]

## 2021-10-29 ENCOUNTER — Telehealth: Payer: Self-pay | Admitting: Nurse Practitioner

## 2021-10-29 NOTE — Telephone Encounter (Signed)
Error

## 2021-10-30 DIAGNOSIS — M545 Low back pain, unspecified: Secondary | ICD-10-CM | POA: Diagnosis not present

## 2021-11-05 ENCOUNTER — Telehealth: Payer: Medicare HMO | Admitting: Internal Medicine

## 2021-11-07 DIAGNOSIS — R531 Weakness: Secondary | ICD-10-CM | POA: Diagnosis not present

## 2021-11-07 DIAGNOSIS — R2689 Other abnormalities of gait and mobility: Secondary | ICD-10-CM | POA: Diagnosis not present

## 2021-11-07 DIAGNOSIS — M545 Low back pain, unspecified: Secondary | ICD-10-CM | POA: Diagnosis not present

## 2021-11-07 DIAGNOSIS — M2569 Stiffness of other specified joint, not elsewhere classified: Secondary | ICD-10-CM | POA: Diagnosis not present

## 2021-11-07 DIAGNOSIS — R293 Abnormal posture: Secondary | ICD-10-CM | POA: Diagnosis not present

## 2021-11-11 ENCOUNTER — Ambulatory Visit (INDEPENDENT_AMBULATORY_CARE_PROVIDER_SITE_OTHER): Payer: Medicare HMO | Admitting: Nurse Practitioner

## 2021-11-11 ENCOUNTER — Encounter: Payer: Self-pay | Admitting: Nurse Practitioner

## 2021-11-11 ENCOUNTER — Other Ambulatory Visit: Payer: Self-pay

## 2021-11-11 VITALS — BP 145/86 | HR 79 | Ht 66.0 in | Wt 175.1 lb

## 2021-11-11 DIAGNOSIS — E7800 Pure hypercholesterolemia, unspecified: Secondary | ICD-10-CM | POA: Diagnosis not present

## 2021-11-11 DIAGNOSIS — M109 Gout, unspecified: Secondary | ICD-10-CM

## 2021-11-11 MED ORDER — PREDNISONE 10 MG PO TABS: ORAL_TABLET | ORAL | 0 refills | Status: AC

## 2021-11-11 MED ORDER — ROSUVASTATIN CALCIUM 10 MG PO TABS: 10.0000 mg | ORAL_TABLET | Freq: Every day | ORAL | 3 refills | Status: DC

## 2021-11-11 MED ORDER — COLCHICINE 0.6 MG PO TABS: ORAL_TABLET | ORAL | 0 refills | Status: DC

## 2021-11-11 NOTE — Patient Instructions (Signed)
I will be moving to [facility] located at [address], [place], [place] [address] effective Nov 30, 2021. °If you would like to establish care with [facility] please call [phone]. °

## 2021-11-11 NOTE — Progress Notes (Signed)
Acute Office Visit  Subjective:    Patient ID: Megan [name], female    DOB: Jan 05, 1951, 70 y.o.   MRN: [id number]  Chief Complaint  Patient presents with   Foot Pain    Right foot pain that started 5 days ago.    HPI Patient is in today for right foot pain and swelling.  She went to PT on Friday and her right ankle started swelling after PT. She is hurting at 10/10. She has hx of both gout and RA.  Past Medical History:  Diagnosis Date   Gout    High cholesterol    Hypertension    Kidney stone    Leukopenia 05/04/2016   Rheumatoid arthritis ([place])    Seasonal allergies    Thyroid disease     Past Surgical History:  Procedure Laterality Date   BILATERAL KNEE ARTHROSCOPY     COLONOSCOPY N/A 07/30/2014   Procedure: COLONOSCOPY;  Surgeon: Danie Binder, MD;  [facility];  Service: Endoscopy;  Laterality: N/A;  9:30 AM   high cholesterol     Right knee osteotomy      Family History  Problem Relation Age of Onset   Hypertension Mother    Asthma Mother    Other Father        Rare blood disease   Hypertension Brother    Colon cancer Paternal Uncle    Bladder Cancer Neg Hx    Kidney cancer Neg Hx     Social History   Socioeconomic History   Marital status: Married    Spouse name: Not on file   Number of children: 4   Years of education: Not on file   Highest education level: Not on file  Occupational History   Occupation: Retired; Works PT in [employer] in [place] Use   Smoking status: Never   Smokeless tobacco: Never  Vaping Use   Vaping Use: Never used  Substance and Sexual Activity   Alcohol use: No   Drug use: No    Comment: used CBD oil   Sexual activity: Yes  Other Topics Concern   Not on file  Social History Narrative   3 biological children and 1 adopted   Social Determinants of Health   Financial Resource Strain: Not on file  Food Insecurity: Not on file  Transportation Needs: Not on file  Physical  Activity: Not on file  Stress: Not on file  Social Connections: Not on file  Intimate Partner Violence: Not on file    Outpatient Medications Prior to Visit  Medication Sig Dispense Refill   hydroxychloroquine (PLAQUENIL) 200 MG tablet      amLODipine (NORVASC) 5 MG tablet Take 1 tablet (5 mg total) by mouth daily. 90 tablet 1   UNABLE TO FIND Med Name: Celery seed tab 1,024m. Takes once weekly. (Patient not taking: Reported on 08/21/2021)     amLODipine (NORVASC) 5 MG tablet      hydroxychloroquine (PLAQUENIL) 200 MG tablet Take 200 mg by mouth 2 (two) times daily.     rosuvastatin (CRESTOR) 20 MG tablet Take 1 tablet (20 mg total) by mouth daily. 90 tablet 3   No facility-administered medications prior to visit.    Allergies  Allergen Reactions   Other Other (See Comments)    Nuts - pt states sometimes makes her gout flare up   Hydrocodone     Makes her feel bad   Naproxen     Makes  her feel bad     Review of Systems  Constitutional: Negative.   Musculoskeletal:  Positive for arthralgias.       Right ankle pain      Objective:    Physical Exam Constitutional:      Appearance: Normal appearance.  Musculoskeletal:        General: Swelling and tenderness present.     Comments: And some mild edema to right ankle  Neurological:     Mental Status: She is alert.    BP (!) 145/86    Pulse 79    Ht 5' 6"  (1.676 m)    Wt 175 lb 1.9 oz (79.4 kg)    SpO2 96%    BMI 28.27 kg/m  Wt Readings from Last 3 Encounters:  11/11/21 175 lb 1.9 oz (79.4 kg)  08/21/21 179 lb (81.2 kg)  07/31/21 176 lb (79.8 kg)    Health Maintenance Due  Topic Date Due   Pneumonia Vaccine 59+ Years old (1 - PCV) Never done   Zoster Vaccines- Shingrix (1 of 2) Never done   COVID-19 Vaccine (3 - Moderna risk series) 12/19/2020    There are no preventive care reminders to display for this patient.   Lab Results  Component Value Date   TSH 1.730 08/19/2021   Lab Results  Component Value  Date   WBC 4.0 08/19/2021   HGB 12.2 08/19/2021   HCT 35.8 08/19/2021   MCV 88 08/19/2021   PLT 244 08/19/2021   Lab Results  Component Value Date   NA 141 08/19/2021   K 4.0 08/19/2021   CO2 26 08/19/2021   GLUCOSE 92 08/19/2021   BUN 10 08/19/2021   CREATININE 0.73 08/19/2021   BILITOT 0.3 08/19/2021   ALKPHOS 102 08/19/2021   AST 20 08/19/2021   ALT 12 08/19/2021   PROT 6.8 08/19/2021   ALBUMIN 4.5 08/19/2021   CALCIUM 10.3 08/19/2021   ANIONGAP 8 07/16/2017   EGFR 88 08/19/2021   Lab Results  Component Value Date   CHOL 263 (H) 08/19/2021   Lab Results  Component Value Date   HDL 97 08/19/2021   Lab Results  Component Value Date   LDLCALC 156 (H) 08/19/2021   Lab Results  Component Value Date   TRIG 65 08/19/2021   Lab Results  Component Value Date   CHOLHDL 2.8 11/19/2020   Lab Results  Component Value Date   HGBA1C 6.0 (H) 08/19/2021       Assessment & Plan:   Problem List Items Addressed This Visit       Other   Hyperlipidemia - Primary    -decreased d of rosuvastatin per pt request d/t headaches -could consider QOD dosing if higher dose is needed for lipid control      Relevant Medications   rosuvastatin (CRESTOR) 10 MG tablet   Gout    -Rx. Prednisone taper and colchicine -she has nabumetone and a muscle relaxer from an urgent care that she was taking for her back -can continue nabumetone for gout pain        Meds ordered this encounter  Medications   colchicine 0.6 MG tablet    Sig: Take 2 tablets PO now, and 1 tablet in 1 hour for gout.    Dispense:  3 tablet    Refill:  0   predniSONE (DELTASONE) 10 MG tablet    Sig: Take 6 tablets (60 mg total) by mouth daily with breakfast for 1 day, THEN 5 tablets (50  mg total) daily with breakfast for 1 day, THEN 4 tablets (40 mg total) daily with breakfast for 1 day, THEN 3 tablets (30 mg total) daily with breakfast for 1 day, THEN 2 tablets (20 mg total) daily with breakfast for 1 day,  THEN 1 tablet (10 mg total) daily with breakfast for 1 day.    Dispense:  21 tablet    Refill:  0   rosuvastatin (CRESTOR) 10 MG tablet    Sig: Take 1 tablet (10 mg total) by mouth daily.    Dispense:  90 tablet    Refill:  [place], NP

## 2021-11-11 NOTE — Assessment & Plan Note (Signed)
-  decreased d of rosuvastatin per pt request d/t headaches -could consider QOD dosing if higher dose is needed for lipid control

## 2021-11-11 NOTE — Assessment & Plan Note (Signed)
-  Rx. Prednisone taper and colchicine -she has nabumetone and a muscle relaxer from an urgent care that she was taking for her back -can continue nabumetone for gout pain

## 2021-11-20 ENCOUNTER — Ambulatory Visit: Payer: Medicare HMO | Admitting: Nurse Practitioner

## 2021-12-11 DIAGNOSIS — R293 Abnormal posture: Secondary | ICD-10-CM | POA: Diagnosis not present

## 2021-12-11 DIAGNOSIS — M2569 Stiffness of other specified joint, not elsewhere classified: Secondary | ICD-10-CM | POA: Diagnosis not present

## 2021-12-11 DIAGNOSIS — R2689 Other abnormalities of gait and mobility: Secondary | ICD-10-CM | POA: Diagnosis not present

## 2021-12-11 DIAGNOSIS — M545 Low back pain, unspecified: Secondary | ICD-10-CM | POA: Diagnosis not present

## 2021-12-11 DIAGNOSIS — R531 Weakness: Secondary | ICD-10-CM | POA: Diagnosis not present

## 2022-01-20 ENCOUNTER — Other Ambulatory Visit (HOSPITAL_COMMUNITY): Payer: Self-pay | Admitting: Nurse Practitioner

## 2022-01-23 ENCOUNTER — Encounter: Payer: Self-pay | Admitting: Nurse Practitioner

## 2022-01-23 ENCOUNTER — Ambulatory Visit (INDEPENDENT_AMBULATORY_CARE_PROVIDER_SITE_OTHER): Payer: Medicare HMO | Admitting: Nurse Practitioner

## 2022-01-23 ENCOUNTER — Other Ambulatory Visit: Payer: Self-pay

## 2022-01-23 VITALS — BP 137/84 | HR 84 | Ht 65.0 in | Wt 176.1 lb

## 2022-01-23 DIAGNOSIS — E663 Overweight: Secondary | ICD-10-CM | POA: Diagnosis not present

## 2022-01-23 DIAGNOSIS — E669 Obesity, unspecified: Secondary | ICD-10-CM | POA: Insufficient documentation

## 2022-01-23 DIAGNOSIS — R7303 Prediabetes: Secondary | ICD-10-CM

## 2022-01-23 DIAGNOSIS — I10 Essential (primary) hypertension: Secondary | ICD-10-CM | POA: Diagnosis not present

## 2022-01-23 DIAGNOSIS — I499 Cardiac arrhythmia, unspecified: Secondary | ICD-10-CM | POA: Diagnosis not present

## 2022-01-23 DIAGNOSIS — E7800 Pure hypercholesterolemia, unspecified: Secondary | ICD-10-CM

## 2022-01-23 NOTE — Assessment & Plan Note (Addendum)
Wt Readings from Last 3 Encounters:  01/23/22 176 lb 1.3 oz (79.9 kg)  11/11/21 175 lb 1.9 oz (79.4 kg)  08/21/21 179 lb (81.2 kg)  Need to rengage in regular exercise 30 minutes 5 times weekly as tolerated. Need for healthy diet consisting of mainly vegetables and protein, less carbohydrate discussed with patient today, patient verbalized understanding.

## 2022-01-23 NOTE — Assessment & Plan Note (Signed)
EKG done in September last year showed SR first-degree AV block with occasional PVC Patient states that her heart rate at home has been between 60s to 70s, states that her heart rate is irregular.   Sometimes has palpitations.  States that she saw a cardiology years ago. patient denies shortness of breath, chest pain. Patient referred to cardiology

## 2022-01-23 NOTE — Assessment & Plan Note (Signed)
Lab Results  Component Value Date   CHOL 263 (H) 08/19/2021   HDL 97 08/19/2021   LDLCALC 156 (H) 08/19/2021   TRIG 65 08/19/2021   CHOLHDL 2.8 11/19/2020  The 10-year ASCVD risk score (Arnett DK, et al., 2019) is: 19.4%   Values used to calculate the score:     Age: 71 years     Sex: Female     Is Non-Hispanic African American: Yes     Diabetic: No     Tobacco smoker: No     Systolic Blood Pressure: 630 mmHg     Is BP treated: Yes     HDL Cholesterol: 97 mg/dL     Total Cholesterol: 263 mg/dL    Pt never took crestor 10mg  that was ordered for her  She took atorvastatin 10mg  for years up till 2 years ago Lipid panel today , would like to go back to atorvastatin if her LDL is high today .

## 2022-01-23 NOTE — Patient Instructions (Addendum)
Please get your shingles vaccine, TDAP vaccine, covid booster at your pharamacy.   It is important that you exercise regularly at least 30 minutes 5 times a week.  Think about what you will eat, plan ahead. Choose " clean, green, fresh or frozen" over canned, processed or packaged foods which are more sugary, salty and fatty. 70 to 75% of food eaten should be vegetables and fruit. Three meals at set times with snacks allowed between meals, but they must be fruit or vegetables. Aim to eat over a 12 hour period , example 7 am to 7 pm, and STOP after  your last meal of the day. Drink water,generally about 64 ounces per day, no other drink is as healthy. Fruit juice is best enjoyed in a healthy way, by EATING the fruit.  Thanks for choosing [facility], we consider it a privelige to serve you.

## 2022-01-23 NOTE — Assessment & Plan Note (Addendum)
Wt Readings from Last 3 Encounters:  01/23/22 176 lb 1.3 oz (79.9 kg)  11/11/21 175 lb 1.9 oz (79.4 kg)  08/21/21 179 lb (81.2 kg)  She took amlodipine 52m  for 2 days and stopped using them because her head would hurts when she takes her BP meds. She only does herbs. BP at home 109/60-70,  Continue to monitor BP fro now Pt will call the office if systolic BP stay above 1746  CMP+EGFR today

## 2022-01-23 NOTE — Progress Notes (Signed)
° °  Megan [name]     MRN: [id number]      DOB: 1951/03/06   HPI Megan [name] past medical history of hypertension, GERD, hyperlipidemia, prediabetes, irregular heartbeat, gout is here for follow up for her chronic medical conditions.   Pt stated that she took steroid last year for gout and back pain, states that she has gained weight as a result of that.   Pt states she feels like her heart rate is slow, sometimes has palpitations states that her heart rate at home is 60's -70, pt denies cp, dizziness.  She took amlodipine for 2 days and stopped using them because her head would hurts when she takes her BP meds patient states that she stopped taking HLD meds due to the same reason. She only does herbs. BP at home 109/60-70,   Pt states that she got 2 pneumonia vaccines some years ago , she has no records of this, she does not take the flu vaccines, the last time she took flu vaccine it gave her blood clots.  Patient encouraged to get her shingles vaccine and Tdap booster at her pharmacy.  She verbalized understanding.   ROS Denies recent fever or chills. Denies sinus pressure, nasal congestion, ear pain or sore throat. Denies chest congestion, productive cough or wheezing. Denies chest pains, palpitations and leg swelling Denies abdominal pain, nausea, vomiting,diarrhea or constipation.   Denies dysuria, frequency, hesitancy or incontinence. Denies joint pain, swelling and limitation in mobility. Denies headaches, seizures, numbness, or tingling. Denies depression, anxiety or insomnia. Denies skin break down or rash.   PE  BP 137/84    Pulse 84    Ht 5\' 5"  (1.651 m)    Wt 176 lb 1.3 oz (79.9 kg)    SpO2 95%    BMI 29.30 kg/m   Patient alert and oriented and in no cardiopulmonary distress. Chest: Clear to auscultation bilaterally.  CVS: S1, S2 no murmurs, no S3.Regular rate.  ABD: Soft non tender.   Ext: No edema  MS: Adequate ROM spine, shoulders, hips and knees.  Psych: Good  eye contact, normal affect. Memory intact not anxious or depressed appearing.  CNS: CN 2-12 intact, power,  normal throughout.no focal deficits noted.   Assessment & Plan

## 2022-01-23 NOTE — Assessment & Plan Note (Signed)
Lab Results  Component Value Date   HGBA1C 6.0 (H) 08/19/2021   Check A1c, Avoid sugar sweets cake bread, engage in regular exercise 30 minutes 5 times weekly as tolerated

## 2022-01-24 ENCOUNTER — Other Ambulatory Visit: Payer: Self-pay | Admitting: Nurse Practitioner

## 2022-01-24 DIAGNOSIS — E7800 Pure hypercholesterolemia, unspecified: Secondary | ICD-10-CM

## 2022-01-24 LAB — LIPID PANEL
Chol/HDL Ratio: 3 ratio (ref 0.0–4.4)
Cholesterol, Total: 274 mg/dL — ABNORMAL HIGH (ref 100–199)
HDL: 92 mg/dL (ref >39)
LDL Chol Calc (NIH): 173 mg/dL — ABNORMAL HIGH (ref 0–99)
Triglycerides: 61 mg/dL (ref 0–149)
VLDL Cholesterol Cal: 9 mg/dL (ref 5–40)

## 2022-01-24 LAB — CMP14+EGFR
ALT: 10 IU/L (ref 0–32)
AST: 19 IU/L (ref 0–40)
Albumin/Globulin Ratio: 1.9 (ref 1.2–2.2)
Albumin: 4.6 g/dL (ref 3.7–4.7)
Alkaline Phosphatase: 104 IU/L (ref 44–121)
BUN/Creatinine Ratio: 15 (ref 12–28)
BUN: 11 mg/dL (ref 8–27)
Bilirubin Total: 0.3 mg/dL (ref 0.0–1.2)
CO2: 25 mmol/L (ref 20–29)
Calcium: 10.5 mg/dL — ABNORMAL HIGH (ref 8.7–10.3)
Chloride: 105 mmol/L (ref 96–106)
Creatinine, Ser: 0.71 mg/dL (ref 0.57–1.00)
Globulin, Total: 2.4 g/dL (ref 1.5–4.5)
Glucose: 89 mg/dL (ref 70–99)
Potassium: 4.4 mmol/L (ref 3.5–5.2)
Sodium: 141 mmol/L (ref 134–144)
Total Protein: 7 g/dL (ref 6.0–8.5)
eGFR: 91 mL/min/{1.73_m2} (ref >59)

## 2022-01-24 LAB — HEMOGLOBIN A1C
Est. average glucose Bld gHb Est-mCnc: 126 mg/dL
Hgb A1c MFr Bld: 6 % — ABNORMAL HIGH (ref 4.8–5.6)

## 2022-01-24 MED ORDER — ATORVASTATIN CALCIUM 10 MG PO TABS: 10.0000 mg | ORAL_TABLET | Freq: Every day | ORAL | 2 refills | Status: DC

## 2022-01-24 NOTE — Progress Notes (Signed)
Please review results with patient  Calcium level is elevated, if patient is taking any supplement that has calcium, patient should stop taking the supplement, we will recheck her calcium level in 6 weeks.  LDL is elevated, went up from 156 5 months ago to 173. patient should start taking atorvastatin 10 mg daily follow-up in 6 weeks.  Patient should have fasting labs done 3 to 5 days before her next visit.  Thanks

## 2022-01-27 ENCOUNTER — Other Ambulatory Visit (HOSPITAL_COMMUNITY): Payer: Self-pay | Admitting: Nurse Practitioner

## 2022-01-27 ENCOUNTER — Telehealth: Payer: Self-pay

## 2022-01-27 DIAGNOSIS — Z1231 Encounter for screening mammogram for malignant neoplasm of breast: Secondary | ICD-10-CM

## 2022-01-27 NOTE — Telephone Encounter (Signed)
Returning call to go over lab results.

## 2022-01-27 NOTE — Telephone Encounter (Signed)
Called pt advised of lab results scheduled 6 week f/u  pt verbalized understanding

## 2022-02-02 ENCOUNTER — Ambulatory Visit: Payer: Medicare HMO | Admitting: Family Medicine

## 2022-02-02 ENCOUNTER — Other Ambulatory Visit: Payer: Self-pay

## 2022-02-02 DIAGNOSIS — Z0000 Encounter for general adult medical examination without abnormal findings: Secondary | ICD-10-CM

## 2022-02-02 NOTE — Progress Notes (Signed)
Subjective:   Megan [name] is a 71 y.o. female who presents for Medicare Annual (Subsequent) preventive examination.  Review of Systems    I connected with  Megan [name] on 02/02/22 by a audio enabled telemedicine application and verified that I am speaking with the correct person using two identifiers.  Patient Location: Home  Provider Location: Office/Clinic  I discussed the limitations of evaluation and management by telemedicine. The patient expressed understanding and agreed to proceed.  Cardiac Risk Factors include: advanced age (>77mn, >>23women);hypertension     Objective:    There were no vitals filed for this visit. There is no height or weight on file to calculate BMI.  Advanced Directives 02/02/2022 01/27/2021 09/11/2019 11/12/2018 08/04/2016 05/04/2016 04/21/2016  Does Patient Have a Medical Advance Directive? Yes Yes No;Yes Yes No No No  Type of Advance Directive Living will;Healthcare Power of [place] - - -  Does patient want to make changes to medical advance directive? No - Patient declined No - Patient declined No - Patient declined - - - -  Copy of [place] Chart? No - copy requested - No - copy requested - - - -  Would patient like information on creating a medical advance directive? No - Patient declined - Yes (MAU/Ambulatory/Procedural Areas - Information given) - No - patient declined information - Yes - Educational materials given    Current Medications (verified) Outpatient Encounter Medications as of 02/02/2022  Medication Sig   amLODipine (NORVASC) 5 MG tablet Take 1 tablet (5 mg total) by mouth daily. (Patient not taking: Reported on 01/23/2022)   atorvastatin (LIPITOR) 10 MG tablet Take 1 tablet (10 mg total) by mouth daily.   hydroxychloroquine (PLAQUENIL) 200 MG tablet  (Patient not taking: Reported on 01/23/2022)   UNABLE TO FIND Med Name: Celery seed tab  1,'000mg'$ . Takes once weekly.   No facility-administered encounter medications on file as of 02/02/2022.    Allergies (verified) Other, Hydrocodone, and Naproxen   History: Past Medical History:  Diagnosis Date   Gout    High cholesterol    Hypertension    Kidney stone    Leukopenia 05/04/2016   Rheumatoid arthritis ([place]    Seasonal allergies    Thyroid disease    Past Surgical History:  Procedure Laterality Date   BILATERAL KNEE ARTHROSCOPY     COLONOSCOPY N/A 07/30/2014   Procedure: COLONOSCOPY;  Surgeon: SDanie Binder MD;  Location: [facility];  Service: Endoscopy;  Laterality: N/A;  9:30 AM   high cholesterol     Right knee osteotomy     Family History  Problem Relation Age of Onset   Hypertension Mother    Asthma Mother    Other Father        Rare blood disease   Hypertension Brother    Colon cancer Paternal Uncle    Bladder Cancer Neg Hx    Kidney cancer Neg Hx    Social History   Socioeconomic History   Marital status: Married    Spouse name: Not on file   Number of children: 4   Years of education: Not on file   Highest education level: Not on file  Occupational History   Occupation: Retired; Works PT in [employer]   Smoking status: Never   Smokeless tobacco: Never  Vaping Use   Vaping Use: Never used  Substance  and Sexual Activity   Alcohol use: No   Drug use: No    Comment: used CBD oil   Sexual activity: Yes  Other Topics Concern   Not on file  Social History Narrative   3 biological children and 1 adopted   Social Determinants of Health   Financial Resource Strain: Low Risk    Difficulty of Paying Living Expenses: Not very hard  Food Insecurity: No Food Insecurity   Worried About Charity fundraiser in the Last Year: Never true   Ran Out of Food in the Last Year: Never true  Transportation Needs: No Transportation Needs   Lack of Transportation (Medical): No   Lack of Transportation  (Non-Medical): No  Physical Activity: Insufficiently Active   Days of Exercise per Week: 4 days   Minutes of Exercise per Session: 30 min  Stress: No Stress Concern Present   Feeling of Stress : Not at all  Social Connections: Moderately Integrated   Frequency of Communication with Friends and Family: Three times a week   Frequency of Social Gatherings with Friends and Family: Twice a week   Attends Religious Services: 1 to 4 times per year   Active Member of [employer] or Organizations: No   Attends Music therapist: Never   Marital Status: Married    Tobacco Counseling Counseling given: Not Answered   Clinical Intake:     Pain : No/denies pain     Nutritional Status: BMI 25 -29 Overweight Nutritional Risks: None Diabetes: No  How often do you need to have someone help you when you read instructions, pamphlets, or other written materials from your doctor or pharmacy?: 1 - Never What is the last grade level you completed in school?: 10th  Diabetic?no  Interpreter Needed?: No      Activities of Daily Living In your present state of health, do you have any difficulty performing the following activities: 02/02/2022 01/31/2022  Hearing? N N  Vision? N N  Difficulty concentrating or making decisions? N N  Walking or climbing stairs? N N  Dressing or bathing? N N  Doing errands, shopping? N N  Preparing Food and eating ? N N  Using the Toilet? N N  In the past six months, have you accidently leaked urine? N N  Do you have problems with loss of bowel control? N N  Managing your Medications? N N  Managing your Finances? N N  Housekeeping or managing your Housekeeping? N N  Some recent data might be hidden    Patient Care Team: Renee Rival, FNP as PCP - General (Nurse Practitioner)  Indicate any recent Medical Services you may have received from other than [facility] providers in the past year (date may be approximate).     Assessment:   This is a  routine wellness examination for Megan [name].  Hearing/Vision screen No results found.  Dietary issues and exercise activities discussed: Current Exercise Habits: Home exercise routine, Frequency (Times/Week): 4, Intensity: Mild, Exercise limited by: None identified   Goals Addressed   None    Depression Screen PHQ 2/9 Scores 02/02/2022 02/02/2022 01/23/2022 11/11/2021 08/21/2021 07/31/2021 07/15/2021  PHQ - 2 Score 0 0 0 1 0 0 0  PHQ- 9 Score - - - - - - -    Fall Risk Fall Risk  02/02/2022 01/31/2022 01/23/2022 11/11/2021 08/21/2021  Falls in the past year? 0 0 0 0 0  Number falls in past yr: 0 0 0 0 0  Injury with  Fall? 1 - 0 0 0  Risk for fall due to : No Fall Risks - No Fall Risks No Fall Risks No Fall Risks  Follow up Education provided - Falls evaluation completed Falls evaluation completed Falls evaluation completed    [place]:  Any stairs in or around the home? No  If so, are there any without handrails?  N/a Home free of loose throw rugs in walkways, pet beds, electrical cords, etc? Yes  Adequate lighting in your home to reduce risk of falls? Yes   ASSISTIVE DEVICES UTILIZED TO PREVENT FALLS:  Life alert? No  Use of a cane, walker or w/c? No  Grab bars in the bathroom? No  Shower chair or bench in shower? Yes  Elevated toilet seat or a handicapped toilet? Yes     Cognitive Function:     6CIT Screen 02/02/2022  What Year? 0 points  What month? 0 points  What time? 0 points  Count back from 20 0 points  Months in reverse 0 points  Repeat phrase 0 points  Total Score 0    Immunizations Immunization History  Administered Date(s) Administered   Moderna SARS-COV2 Booster Vaccination 11/21/2020   Moderna Sars-Covid-2 Vaccination 03/14/2020, 04/16/2020    TDAP status: Due, Education has been provided regarding the importance of this vaccine. Advised may receive this vaccine at local pharmacy or Health Dept. Aware to provide a copy of the  vaccination record if obtained from local pharmacy or Health Dept. Verbalized acceptance and understanding.  Flu Vaccine status: Up to date  Pneumococcal vaccine status: Due, Education has been provided regarding the importance of this vaccine. Advised may receive this vaccine at local pharmacy or Health Dept. Aware to provide a copy of the vaccination record if obtained from local pharmacy or Health Dept. Verbalized acceptance and understanding.  Covid-19 vaccine status: Completed vaccines  Qualifies for Shingles Vaccine? Yes   Zostavax completed No   Shingrix Completed?: No.    Education has been provided regarding the importance of this vaccine. Patient has been advised to call insurance company to determine out of pocket expense if they have not yet received this vaccine. Advised may also receive vaccine at local pharmacy or Health Dept. Verbalized acceptance and understanding.  Screening Tests Health Maintenance  Topic Date Due   Zoster Vaccines- Shingrix (1 of 2) Never done   Pneumonia Vaccine 72+ Years old (1 - PCV) Never done   COVID-19 Vaccine (3 - Moderna risk series) 12/19/2020   TETANUS/TDAP  11/30/2021   INFLUENZA VACCINE  11/30/2028 (Originally 06/30/2021)   MAMMOGRAM  02/18/2023   COLONOSCOPY (Pts 45-52yr Insurance coverage will need to be confirmed)  07/30/2024   DEXA SCAN  Completed   Hepatitis C Screening  Completed   HPV VACCINES  Aged Out    Health Maintenance  Health Maintenance Due  Topic Date Due   Zoster Vaccines- Shingrix (1 of 2) Never done   Pneumonia Vaccine 72 Years old (1 - PCV) Never done   COVID-19 Vaccine (3 - Moderna risk series) 12/19/2020   TETANUS/TDAP  11/30/2021    Colorectal cancer screening: Type of screening: Colonoscopy. Completed 07/30/2014. Repeat every 10 years  Mammogram status: Completed 02/17/2021. Repeat every year  Bone Density status: Completed 02/17/2021. Results reflect: Bone density results: OSTEOPOROSIS. Repeat every 0  years.  Lung Cancer Screening: (Low Dose CT Chest recommended if Age 72-80years, 30 pack-year currently smoking OR have quit w/in 15years.) does not qualify.  Lung Cancer Screening Referral: n/a  Additional Screening:  Hepatitis C Screening: does qualify; Completed 09/11/2019  Vision Screening: Recommended annual ophthalmology exams for early detection of glaucoma and other disorders of the eye. Is the patient up to date with their annual eye exam?   not yet Who is the provider or what is the name of the office in which the patient attends annual eye exams?  Will have annual opthalmolofy exam in 2weeks at [facility] in [place] If pt is not established with a provider, would they like to be referred to a provider to establish care?  N/a .   Dental Screening: Recommended annual dental exams for proper oral hygiene      Plan:     I have personally reviewed and noted the following in the patients chart:   Medical and social history Use of alcohol, tobacco or illicit drugs  Current medications and supplements including opioid prescriptions.  Functional ability and status Nutritional status Physical activity Advanced directives List of other physicians Hospitalizations, surgeries, and ER visits in previous 12 months Vitals Screenings to include cognitive, depression, and falls Referrals and appointments  In addition, I have reviewed and discussed with patient certain preventive protocols, quality metrics, and best practice recommendations. A written personalized care plan for preventive services as well as general preventive health recommendations were provided to patient.     Kathlyn [place], RN   02/02/2022   Nurse Notes:   Ms. Mcbreen , Thank you for taking time to come for your Medicare Wellness Visit. I appreciate your ongoing commitment to your health goals. Please review the following plan we discussed and let me know if I can assist you in the future.   These  are the goals we discussed:  Goals   None     This is a list of the screening recommended for you and due dates:  Health Maintenance  Topic Date Due   Zoster (Shingles) Vaccine (1 of 2) Never done   Pneumonia Vaccine (1 - PCV) Never done   COVID-19 Vaccine (3 - Moderna risk series) 12/19/2020   Tetanus Vaccine  11/30/2021   Flu Shot  11/30/2028*   Mammogram  02/18/2023   Colon Cancer Screening  07/30/2024   DEXA scan (bone density measurement)  Completed   Hepatitis C Screening: USPSTF Recommendation to screen - Ages 32-79 yo.  Completed   HPV Vaccine  Aged Out  *Topic was postponed. The date shown is not the original due date.

## 2022-02-02 NOTE — Patient Instructions (Signed)
?  Ms. Megan [name] , ?Thank you for taking time to come for your Medicare Wellness Visit. I appreciate your ongoing commitment to your health goals. Please review the following plan we discussed and let me know if I can assist you in the future.  ? ?These are the goals we discussed: ? Goals   ?None ?  ?  ?This is a list of the screening recommended for you and due dates:  ?Health Maintenance  ?Topic Date Due  ? Zoster (Shingles) Vaccine (1 of 2) Never done  ? Pneumonia Vaccine (1 - PCV) Never done  ? COVID-19 Vaccine (3 - Moderna risk series) 12/19/2020  ? Tetanus Vaccine  11/30/2021  ? Flu Shot  11/30/2028*  ? Mammogram  02/18/2023  ? Colon Cancer Screening  07/30/2024  ? DEXA scan (bone density measurement)  Completed  ? Hepatitis C Screening: USPSTF Recommendation to screen - Ages 26-79 yo.  Completed  ? HPV Vaccine  Aged Out  ?*Topic was postponed. The date shown is not the original due date.  ?  ?

## 2022-02-23 ENCOUNTER — Inpatient Hospital Stay (HOSPITAL_COMMUNITY): Admission: RE | Admit: 2022-02-23 | Payer: Medicare HMO | Source: Ambulatory Visit

## 2022-03-11 ENCOUNTER — Ambulatory Visit: Payer: Medicare HMO | Admitting: Nurse Practitioner

## 2022-04-06 ENCOUNTER — Ambulatory Visit (HOSPITAL_COMMUNITY)
Admission: RE | Admit: 2022-04-06 | Discharge: 2022-04-06 | Disposition: A | Discharge status: Home / Self Care | Payer: Medicare HMO | Source: Ambulatory Visit | Attending: Nurse Practitioner | Admitting: Nurse Practitioner

## 2022-04-06 DIAGNOSIS — Z1231 Encounter for screening mammogram for malignant neoplasm of breast: Secondary | ICD-10-CM | POA: Insufficient documentation

## 2022-04-07 ENCOUNTER — Other Ambulatory Visit (HOSPITAL_COMMUNITY): Payer: Self-pay | Admitting: Nurse Practitioner

## 2022-04-07 DIAGNOSIS — R928 Other abnormal and inconclusive findings on diagnostic imaging of breast: Secondary | ICD-10-CM

## 2022-04-08 ENCOUNTER — Other Ambulatory Visit (HOSPITAL_COMMUNITY): Payer: Self-pay | Admitting: Internal Medicine

## 2022-04-08 ENCOUNTER — Other Ambulatory Visit: Payer: Self-pay | Admitting: Family Medicine

## 2022-04-08 DIAGNOSIS — Z1231 Encounter for screening mammogram for malignant neoplasm of breast: Secondary | ICD-10-CM

## 2022-04-13 NOTE — Progress Notes (Signed)
Cardiology Office Note:    Date:  04/17/2022   ID:  Megan [name], DOB November 09, 1951, MRN [id number]  PCP:  Renee Rival, FNP   [facility] Providers Cardiologist:  None     Referring MD: Renee Rival, FNP   CC: PVCs Consulted for the evaluation of Irregular heart beats at the behest of Renee Rival, FNP  History of Present Illness:    Megan [name] is a 71 y.o. female with a hx of HTN, HLD, PVCs on 08/21/21 EKG who presents for irregular heart beats.  Patient notes that she is feeling better now.    Notes she that had COVID-19 in 2021.  Had no initial symptoms but had reflexive tachycardia and SOB.  Decrease smell and taste.  She thinks may explain the funny heart beats.  Her PCP saw PVCs and thought we should get them checked.  Patient feels great: she is out an about on [place] acres [place] almost every day (unless is rains).  Takes aged garlic and likes supplements over medications.  Had celery juice over BP meds and started having headaches.  Has had no chest pain, chest pressure, chest tightness, chest stinging.  No shortness of breath, DOE.  No PND or orthopnea.  No weight gain, leg swelling , or abdominal swelling.  No syncope or near syncope. Notes  no palpitations or funny heart beats.     Blood pressure has improved.   Past Medical History:  Diagnosis Date   Gout    High cholesterol    Hypertension    Kidney stone    Leukopenia 05/04/2016   Rheumatoid arthritis ([place])    Seasonal allergies    Thyroid disease     Past Surgical History:  Procedure Laterality Date   BILATERAL KNEE ARTHROSCOPY     COLONOSCOPY N/A 07/30/2014   Procedure: COLONOSCOPY;  Surgeon: Danie Binder, MD;  Location: [facility];  Service: Endoscopy;  Laterality: N/A;  9:30 AM   high cholesterol     Right knee osteotomy      Current Medications: Current Meds  Medication Sig   amLODipine (NORVASC) 5 MG tablet Take 1 tablet (5 mg total) by mouth daily.   atorvastatin  (LIPITOR) 10 MG tablet Take 1 tablet (10 mg total) by mouth daily.   UNABLE TO FIND Med Name: Celery seed tab 1,'000mg'$ . Takes once weekly.     Allergies:   Other, Hydrocodone, and Naproxen   Social History   Socioeconomic History   Marital status: Married    Spouse name: Not on file   Number of children: 4   Years of education: Not on file   Highest education level: Not on file  Occupational History   Occupation: Retired; Works PT in [employer] at [employer] in [place] Use   Smoking status: Never   Smokeless tobacco: Never  Vaping Use   Vaping Use: Never used  Substance and Sexual Activity   Alcohol use: No   Drug use: No    Comment: used CBD oil   Sexual activity: Yes  Other Topics Concern   Not on file  Social History Narrative   3 biological children and 1 adopted   Social Determinants of Health   Financial Resource Strain: Low Risk    Difficulty of Paying Living Expenses: Not very hard  Food Insecurity: No Food Insecurity   Worried About Charity fundraiser in the Last Year: Never true   [employer]  in the Last Year: Never true  Transportation Needs: No Transportation Needs   Lack of Transportation (Medical): No   Lack of Transportation (Non-Medical): No  Physical Activity: Insufficiently Active   Days of Exercise per Week: 4 days   Minutes of Exercise per Session: 30 min  Stress: No Stress Concern Present   Feeling of Stress : Not at all  Social Connections: Moderately Integrated   Frequency of Communication with Friends and Family: Three times a week   Frequency of Social Gatherings with Friends and Family: Twice a week   Attends Religious Services: 1 to 4 times per year   Active Member of [employer] or Organizations: No   Attends Music therapist: Never   Marital Status: Married   Social: Systems analyst, recently retired, 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 There is no history of Bladder Cancer or Kidney cancer.  ROS:   Please see the history of present illness.     All other systems reviewed and are negative.  EKGs/Labs/Other Studies Reviewed:    The following studies were reviewed today:  EKG:  EKG is  ordered today.  The ekg ordered today demonstrates  04/17/22: Sinus rhythm with 1st degree heart block  Recent Labs: 08/19/2021: Hemoglobin 12.2; Platelets 244; TSH 1.730 01/23/2022: ALT 10; BUN 11; Creatinine, Ser 0.71; Potassium 4.4; Sodium 141  Recent Lipid Panel    Component Value Date/Time   CHOL 274 (H) 01/23/2022 1111   TRIG 61 01/23/2022 1111   HDL 92 01/23/2022 1111   CHOLHDL 3.0 01/23/2022 1111   CHOLHDL 2.8 11/19/2020 1440   LDLCALC 173 (H) 01/23/2022 1111   LDLCALC 148 (H) 11/19/2020 1440        Physical Exam:    VS:  BP 134/76   Pulse 65   Ht '5\' 6"'$  (1.676 m)   Wt 178 lb (80.7 kg)   SpO2 99%   BMI 28.73 kg/m     Wt Readings from Last 3 Encounters:  04/17/22 178 lb (80.7 kg)  01/23/22 176 lb 1.3 oz (79.9 kg)  11/11/21 175 lb 1.9 oz (79.4 kg)    Gen: No distress   Neck: No JVD, no carotid bruit Cardiac: No Rubs or Gallops, no Murmur, RRR +2 radial pulses Respiratory: Clear to auscultation bilaterally, normal effort, normal  respiratory rate GI: Soft, nontender, non-distended  MS: No  edema;  moves all extremities Integument: Skin feels warm Neuro:  At time of evaluation, alert and oriented to person/place/time/situation  Psych: Normal affect, patient feels well   ASSESSMENT:    1. Primary hypertension   2. PVC (premature ventricular contraction)   3. Pure hypercholesterolemia    PLAN:    PVCs SR with 1st HB - will get echo and ZioPatch to assess burden and LV function - given her preferences, high burden to start medication   HTN and HLD - continue medications for now - in f/u we will offer  medications and discuss   Six months me or APP         Medication Adjustments/Labs and Tests Ordered: Current medicines are reviewed at length with the patient today.  Concerns regarding medicines are outlined above.  Orders Placed This Encounter  Procedures   EKG 12-Lead   ECHOCARDIOGRAM COMPLETE   No orders of the defined types were placed in this  encounter.   Patient Instructions  Medication Instructions:  Your physician recommends that you continue on your current medications as directed. Please refer to the Current Medication list given to you today.   Labwork: None  Testing/Procedures: Your physician has requested that you have an echocardiogram. Echocardiography is a painless test that uses sound waves to create images of your heart. It provides your doctor with information about the size and shape of your heart and how well your heart's chambers and valves are working. This procedure takes approximately one hour. There are no restrictions for this procedure.   Follow-Up: Follow up with Dr. Gasper Sells in 6 months.   Any Other Special Instructions Will Be Listed Below (If Applicable).     If you need a refill on your cardiac medications before your next appointment, please call your pharmacy.   ZIO XT- Long Term Monitor Instructions   Your physician has requested you wear your ZIO patch monitor_______days.   This is a single patch monitor.  [employer] supplies one patch monitor per enrollment.  Additional stickers are not available.   Please do not apply patch if you will be having a Nuclear Stress Test, Echocardiogram, Cardiac CT, MRI, or Chest Xray during the time frame you would be wearing the monitor. The patch cannot be worn during these tests.  You cannot remove and re-apply the ZIO XT patch monitor.   Your ZIO patch monitor will be sent USPS Priority mail from [facility] directly to your home address. The monitor may also be mailed to a PO BOX  if home delivery is not available.   It may take 3-5 days to receive your monitor after you have been enrolled.   Once you have received you monitor, please review enclosed instructions.  Your monitor has already been registered assigning a specific monitor serial # to you.   Applying the monitor   Shave hair from upper left chest.   Hold abrader disc by orange tab.  Rub abrader in 40 strokes over left upper chest as indicated in your monitor instructions.   Clean area with 4 enclosed alcohol pads .  Use all pads to assure are is cleaned thoroughly.  Let dry.   Apply patch as indicated in monitor instructions.  Patch will be place under collarbone on left side of chest with arrow pointing upward.   Rub patch adhesive wings for 2 minutes.Remove white label marked "1".  Remove white label marked "2".  Rub patch adhesive wings for 2 additional minutes.   While looking in a mirror, press and release button in center of patch.  A small green light will flash 3-4 times .  This will be your only indicator the monitor has been turned on.     Do not shower for the first 24 hours.  You may shower after the first 24 hours.   Press button if you feel a symptom. You will hear a small click.  Record Date, Time and Symptom in the Patient Log Book.   When you are ready to remove patch, follow instructions on last 2 pages of Patient Log Book.  Stick patch monitor onto last page of Patient Log Book.   Place Patient Log Book in [place] box.  Use locking tab on box and tape box closed securely.  The [employer] and [employer] has [employer] on it.  Please place in mailbox as soon as possible.  Your physician should have your test results approximately 7 days after the monitor has  been mailed back to [place].   Call [place] at [phone] if you have questions regarding your ZIO XT patch monitor.  Call them immediately if you see an orange light blinking on your monitor.   If your  monitor falls off in less than 4 days contact our Monitor department at [phone].  If your monitor becomes loose or falls off after 4 days call [employer] at [phone] for suggestions on securing your monitor.     Signed, Werner Lean, MD  04/17/2022 2:55 PM    [place]

## 2022-04-17 ENCOUNTER — Other Ambulatory Visit: Payer: Self-pay | Admitting: Internal Medicine

## 2022-04-17 ENCOUNTER — Ambulatory Visit: Payer: Medicare HMO | Admitting: Internal Medicine

## 2022-04-17 ENCOUNTER — Ambulatory Visit (INDEPENDENT_AMBULATORY_CARE_PROVIDER_SITE_OTHER): Payer: Medicare HMO

## 2022-04-17 ENCOUNTER — Encounter: Payer: Self-pay | Admitting: Internal Medicine

## 2022-04-17 VITALS — BP 134/76 | HR 65 | Ht 66.0 in | Wt 178.0 lb

## 2022-04-17 DIAGNOSIS — E7800 Pure hypercholesterolemia, unspecified: Secondary | ICD-10-CM | POA: Diagnosis not present

## 2022-04-17 DIAGNOSIS — I493 Ventricular premature depolarization: Secondary | ICD-10-CM

## 2022-04-17 DIAGNOSIS — I10 Essential (primary) hypertension: Secondary | ICD-10-CM

## 2022-04-17 NOTE — Patient Instructions (Signed)
Medication Instructions:  Your physician recommends that you continue on your current medications as directed. Please refer to the Current Medication list given to you today.   Labwork: None  Testing/Procedures: Your physician has requested that you have an echocardiogram. Echocardiography is a painless test that uses sound waves to create images of your heart. It provides your doctor with information about the size and shape of your heart and how well your heart's chambers and valves are working. This procedure takes approximately one hour. There are no restrictions for this procedure.   Follow-Up: Follow up with Dr. Gasper Sells in 6 months.   Any Other Special Instructions Will Be Listed Below (If Applicable).     If you need a refill on your cardiac medications before your next appointment, please call your pharmacy.   ZIO XT- Long Term Monitor Instructions   Your physician has requested you wear your ZIO patch monitor_______days.   This is a single patch monitor.  Ir[employer] supplies one patch monitor per enrollment.  Additional stickers are not available.   Please do not apply patch if you will be having a Nuclear Stress Test, Echocardiogram, Cardiac CT, MRI, or Chest Xray during the time frame you would be wearing the monitor. The patch cannot be worn during these tests.  You cannot remove and re-apply the ZIO XT patch monitor.   Your ZIO patch monitor will be sent USPS Priority mail from [facility] directly to your home address. The monitor may also be mailed to a PO BOX if home delivery is not available.   It may take 3-5 days to receive your monitor after you have been enrolled.   Once you have received you monitor, please review enclosed instructions.  Your monitor has already been registered assigning a specific monitor serial # to you.   Applying the monitor   Shave hair from upper left chest.   Hold abrader disc by orange tab.  Rub abrader in 40 strokes  over left upper chest as indicated in your monitor instructions.   Clean area with 4 enclosed alcohol pads .  Use all pads to assure are is cleaned thoroughly.  Let dry.   Apply patch as indicated in monitor instructions.  Patch will be place under collarbone on left side of chest with arrow pointing upward.   Rub patch adhesive wings for 2 minutes.Remove white label marked "1".  Remove white label marked "2".  Rub patch adhesive wings for 2 additional minutes.   While looking in a mirror, press and release button in center of patch.  A small green light will flash 3-4 times .  This will be your only indicator the monitor has been turned on.     Do not shower for the first 24 hours.  You may shower after the first 24 hours.   Press button if you feel a symptom. You will hear a small click.  Record Date, Time and Symptom in the Patient Log Book.   When you are ready to remove patch, follow instructions on last 2 pages of Patient Log Book.  Stick patch monitor onto last page of Patient Log Book.   Place Patient Log Book in [place] box.  Use locking tab on box and tape box closed securely.  The [employer] has [employer] on it.  Please place in mailbox as soon as possible.  Your physician should have your test results approximately 7 days after the monitor has been mailed back to [facility].  Call [place] at [phone] if you have questions regarding your ZIO XT patch monitor.  Call them immediately if you see an orange light blinking on your monitor.   If your monitor falls off in less than 4 days contact our Monitor department at [phone].  If your monitor becomes loose or falls off after 4 days call [employer] at [phone] for suggestions on securing your monitor.

## 2022-04-28 ENCOUNTER — Other Ambulatory Visit: Payer: Self-pay

## 2022-04-28 DIAGNOSIS — I10 Essential (primary) hypertension: Secondary | ICD-10-CM

## 2022-04-28 MED ORDER — AMLODIPINE BESYLATE 5 MG PO TABS: 5.0000 mg | ORAL_TABLET | Freq: Every day | ORAL | 1 refills | Status: DC

## 2022-04-29 ENCOUNTER — Other Ambulatory Visit: Payer: Medicare HMO

## 2022-04-29 ENCOUNTER — Ambulatory Visit (HOSPITAL_COMMUNITY)
Admission: RE | Admit: 2022-04-29 | Discharge: 2022-04-29 | Disposition: A | Discharge status: Home / Self Care | Payer: Medicare HMO | Source: Ambulatory Visit | Attending: Nurse Practitioner | Admitting: Nurse Practitioner

## 2022-04-29 DIAGNOSIS — R928 Other abnormal and inconclusive findings on diagnostic imaging of breast: Secondary | ICD-10-CM

## 2022-04-29 NOTE — Progress Notes (Signed)
Normal results follow-up mammogram in 1 year

## 2022-05-15 ENCOUNTER — Other Ambulatory Visit: Payer: Self-pay

## 2022-05-15 DIAGNOSIS — E7800 Pure hypercholesterolemia, unspecified: Secondary | ICD-10-CM

## 2022-05-15 DIAGNOSIS — I10 Essential (primary) hypertension: Secondary | ICD-10-CM

## 2022-05-15 MED ORDER — ATORVASTATIN CALCIUM 10 MG PO TABS: 10.0000 mg | ORAL_TABLET | Freq: Every day | ORAL | 2 refills | Status: DC

## 2022-05-15 MED ORDER — AMLODIPINE BESYLATE 5 MG PO TABS: 5.0000 mg | ORAL_TABLET | Freq: Every day | ORAL | 1 refills | Status: DC

## 2022-05-26 ENCOUNTER — Encounter: Payer: Self-pay | Admitting: Nurse Practitioner

## 2022-05-26 ENCOUNTER — Ambulatory Visit (INDEPENDENT_AMBULATORY_CARE_PROVIDER_SITE_OTHER): Payer: Medicare HMO | Admitting: Nurse Practitioner

## 2022-05-26 VITALS — BP 133/84 | HR 62 | Ht 64.0 in | Wt 177.0 lb

## 2022-05-26 DIAGNOSIS — R7989 Other specified abnormal findings of blood chemistry: Secondary | ICD-10-CM | POA: Diagnosis not present

## 2022-05-26 DIAGNOSIS — E669 Obesity, unspecified: Secondary | ICD-10-CM

## 2022-05-26 DIAGNOSIS — E782 Mixed hyperlipidemia: Secondary | ICD-10-CM

## 2022-05-26 DIAGNOSIS — I10 Essential (primary) hypertension: Secondary | ICD-10-CM

## 2022-05-26 DIAGNOSIS — H543 Unqualified visual loss, both eyes: Secondary | ICD-10-CM

## 2022-05-26 DIAGNOSIS — Z0000 Encounter for general adult medical examination without abnormal findings: Secondary | ICD-10-CM

## 2022-05-26 NOTE — Assessment & Plan Note (Addendum)
She has not been taking atorvastatin 10 mg consistently due to fear of side effects from the medication Recheck lipid panel today Side effects of medication including muscle Pain and aches discussed with patient. Patient agrees to taking atorvastatin if her LDL level remains high to help reduce her risk of MI, stroke Check lipid panel today.  .The 10-year ASCVD risk score (Arnett DK, et al., 2019) is: 19%   Values used to calculate the score:     Age: 71 years     Sex: Female     Is Non-Hispanic African American: Yes     Diabetic: No     Tobacco smoker: No     Systolic Blood Pressure: 133 mmHg     Is BP treated: Yes     HDL Cholesterol: 92 mg/dL     Total Cholesterol: 274 mg/dL

## 2022-05-27 ENCOUNTER — Other Ambulatory Visit: Payer: Self-pay | Admitting: Nurse Practitioner

## 2022-05-27 DIAGNOSIS — E785 Hyperlipidemia, unspecified: Secondary | ICD-10-CM

## 2022-05-27 DIAGNOSIS — R7989 Other specified abnormal findings of blood chemistry: Secondary | ICD-10-CM

## 2022-05-27 LAB — CBC WITH DIFFERENTIAL/PLATELET
Basophils Absolute: 0 10*3/uL (ref 0.0–0.2)
Basos: 0 %
EOS (ABSOLUTE): 0.2 10*3/uL (ref 0.0–0.4)
Eos: 5 %
Hematocrit: 35 % (ref 34.0–46.6)
Hemoglobin: 12.4 g/dL (ref 11.1–15.9)
Immature Grans (Abs): 0 10*3/uL (ref 0.0–0.1)
Immature Granulocytes: 0 %
Lymphocytes Absolute: 1.2 10*3/uL (ref 0.7–3.1)
Lymphs: 34 %
MCH: 30.4 pg (ref 26.6–33.0)
MCHC: 35.4 g/dL (ref 31.5–35.7)
MCV: 86 fL (ref 79–97)
Monocytes Absolute: 0.4 10*3/uL (ref 0.1–0.9)
Monocytes: 11 %
Neutrophils Absolute: 1.7 10*3/uL (ref 1.4–7.0)
Neutrophils: 50 %
Platelets: 200 10*3/uL (ref 150–450)
RBC: 4.08 x10E6/uL (ref 3.77–5.28)
RDW: 11.8 % (ref 11.7–15.4)
WBC: 3.5 10*3/uL (ref 3.4–10.8)

## 2022-05-27 LAB — CMP14+EGFR
ALT: 14 IU/L (ref 0–32)
AST: 19 IU/L (ref 0–40)
Albumin/Globulin Ratio: 1.8 (ref 1.2–2.2)
Albumin: 4.4 g/dL (ref 3.7–4.7)
Alkaline Phosphatase: 110 IU/L (ref 44–121)
BUN/Creatinine Ratio: 14 (ref 12–28)
BUN: 9 mg/dL (ref 8–27)
Bilirubin Total: 0.4 mg/dL (ref 0.0–1.2)
CO2: 23 mmol/L (ref 20–29)
Calcium: 10.4 mg/dL — ABNORMAL HIGH (ref 8.7–10.3)
Chloride: 103 mmol/L (ref 96–106)
Creatinine, Ser: 0.66 mg/dL (ref 0.57–1.00)
Globulin, Total: 2.5 g/dL (ref 1.5–4.5)
Glucose: 93 mg/dL (ref 70–99)
Potassium: 4.1 mmol/L (ref 3.5–5.2)
Sodium: 140 mmol/L (ref 134–144)
Total Protein: 6.9 g/dL (ref 6.0–8.5)
eGFR: 94 mL/min/{1.73_m2} (ref >59)

## 2022-05-27 LAB — LIPID PANEL
Chol/HDL Ratio: 2.4 ratio (ref 0.0–4.4)
Cholesterol, Total: 221 mg/dL — ABNORMAL HIGH (ref 100–199)
HDL: 93 mg/dL (ref >39)
LDL Chol Calc (NIH): 119 mg/dL — ABNORMAL HIGH (ref 0–99)
Triglycerides: 50 mg/dL (ref 0–149)
VLDL Cholesterol Cal: 9 mg/dL (ref 5–40)

## 2022-05-27 LAB — TSH: TSH: 1.18 u[IU]/mL (ref 0.450–4.500)

## 2022-05-27 LAB — VITAMIN D 25 HYDROXY (VIT D DEFICIENCY, FRACTURES): Vit D, 25-Hydroxy: 30.3 ng/mL (ref 30.0–100.0)

## 2022-05-27 LAB — FERRITIN: Ferritin: 227 ng/mL — ABNORMAL HIGH (ref 15–150)

## 2022-05-27 NOTE — Progress Notes (Signed)
LDL is much improved but not at goal of less than 100, patient should start taking atorvastatin 10 mg daily as ordered follow-up in 8 weeks to recheck labs.  Calcium level slightly elevated drink at least 64 ounces of water daily, avoid calcium supplements. Vitamin D level is normal  Ferritin levels remains elevated will refer patient to hematology for further testing  CBC is normal, thyroid normal

## 2022-06-19 ENCOUNTER — Encounter (HOSPITAL_COMMUNITY): Payer: Medicare HMO | Admitting: Hematology

## 2022-07-22 ENCOUNTER — Ambulatory Visit: Payer: Medicare HMO | Admitting: Nurse Practitioner

## 2022-07-30 ENCOUNTER — Encounter: Payer: Self-pay | Admitting: Family Medicine

## 2022-07-30 ENCOUNTER — Ambulatory Visit (INDEPENDENT_AMBULATORY_CARE_PROVIDER_SITE_OTHER): Payer: Medicare HMO | Admitting: Family Medicine

## 2022-07-30 VITALS — BP 134/84 | HR 72 | Ht 64.0 in | Wt 174.0 lb

## 2022-07-30 DIAGNOSIS — M7061 Trochanteric bursitis, right hip: Secondary | ICD-10-CM | POA: Diagnosis not present

## 2022-07-30 NOTE — Progress Notes (Signed)
   Acute Office Visit  Subjective:     Patient ID: Megan [name], female    DOB: Jul 06, 1951, 71 y.o.   MRN: [id number]  Chief Complaint  Patient presents with   Hip Pain    Right side 8/30    HPI Patient is in today for c/o of right hip pain on 07/29/22. She denies any recent injury or trauma to the affected hip. She rated pain 15/10 at onset, but today, pain is rated 8/10. She reports taking Motrin with moderate relief of symptoms. Review of Systems  Constitutional:  Negative for chills and fever.  Cardiovascular:  Negative for palpitations and leg swelling.  Musculoskeletal:        Lateral hip pain (right)  Psychiatric/Behavioral:  Negative for memory loss. The patient does not have insomnia.         Objective:    BP 134/84 (BP Location: Left Arm, Patient Position: Sitting, Cuff Size: Normal)   Pulse 72   Ht '5\' 4"'$  (1.626 m)   Wt 174 lb (78.9 kg)   SpO2 96%   BMI 29.87 kg/m    Physical Exam Cardiovascular:     Rate and Rhythm: Normal rate and regular rhythm.     Pulses: Normal pulses.     Heart sounds: Normal heart sounds.  Pulmonary:     Effort: Pulmonary effort is normal.     Breath sounds: Normal breath sounds.  Musculoskeletal:     Right hip: Tenderness present. No deformity or lacerations.     Comments: Tenderness to palpation of the greater trochanter  Neurological:     Mental Status: She is alert.     No results found for any visits on 07/30/22.      Assessment & Plan:   Problem List Items Addressed This Visit       Musculoskeletal and Integument   Trochanteric bursitis, right hip - Primary    Tenderness to palpation of the greater trochanter Encouraged activity modification to relieve stress on the gluteus medius and gluteus minimus tendons Advised to minimize stair climbing, walking up hills Referral was placed to PT Encouraged use to continue taking NSAID for pain and to decrease inflammation       Relevant Orders   Ambulatory referral  to Physical Therapy    No orders of the defined types were placed in this encounter.   Return if symptoms worsen or fail to improve.  Alvira Monday, FNP

## 2022-07-30 NOTE — Assessment & Plan Note (Signed)
Tenderness to palpation of the greater trochanter Encouraged activity modification to relieve stress on the gluteus medius and gluteus minimus tendons Advised to minimize stair climbing, walking up hills Referral was placed to PT Encouraged use to continue taking NSAID for pain and to decrease inflammation

## 2022-07-30 NOTE — Patient Instructions (Addendum)
I appreciate the opportunity to provide care to you today!    Follow up:  with [facility]  Trochanteric bursitis  I recommend activities modification   Minimize stair climbing, walking up hills  ?Avoid hip adduction across the midline  ?Sit with hips positioned higher than knees; avoid crossing legs while sitting  ?Stand with equal weightbearing through lower limbs  ?Avoid side-lying to reduce compressive tendon load   And continue the use of NSAID for pain  Referral: PT       Please continue to a heart-healthy diet and increase your physical activities. Try to exercise for 73mns at least three times a week.      It was a pleasure to see you and I look forward to continuing to work together on your health and well-being. Please do not hesitate to call the office if you need care or have questions about your care.   Have a wonderful day and week. With Gratitude, GAlvira MondayMSN, FNP-BC

## 2022-08-26 ENCOUNTER — Ambulatory Visit: Payer: Medicare HMO | Admitting: Orthopedic Surgery

## 2022-08-26 ENCOUNTER — Encounter: Payer: Self-pay | Admitting: Orthopedic Surgery

## 2022-08-26 ENCOUNTER — Ambulatory Visit (INDEPENDENT_AMBULATORY_CARE_PROVIDER_SITE_OTHER): Payer: Medicare HMO

## 2022-08-26 DIAGNOSIS — M25561 Pain in right knee: Secondary | ICD-10-CM | POA: Diagnosis not present

## 2022-08-26 DIAGNOSIS — M1711 Unilateral primary osteoarthritis, right knee: Secondary | ICD-10-CM

## 2022-08-26 DIAGNOSIS — G8929 Other chronic pain: Secondary | ICD-10-CM

## 2022-08-26 NOTE — Progress Notes (Signed)
Chief Complaint  Patient presents with   Knee Pain    Right / asked how knee is doing states " I don't know"    71 year old female had a tibial osteotomy with a medial plate she developed some arthritis in the medial side of the joint but was in stable condition and was scheduled for x-ray last year but canceled that appointment due to the fact that she was doing so well  She went down as she puts it and started having some right leg pain which included knee pain knee swelling and right hip pain  She is followed for lower back pain chronic basis  Now she comes in for evaluation of the right knee the swelling is gone down she is moving it well she is concerned that something may have happened to the plate.  She declined rheumatoid arthritis medications due to the side effects primarily takes ibuprofen for pain  Encounter Diagnoses  Name Primary?   Chronic pain of right knee Yes   Arthritis of right knee      X-rays The knee is still in valgus the joint shows grade 3 disease with narrowing and sclerosis on the medial side the plate is intact  No new injury noted no progression of her arthritis noted the plate is intact the patient is reassured that there is nothing serious going on with the knee and she will resume her normal activities

## 2022-08-26 NOTE — Patient Instructions (Signed)
Give her a note for work fax if needed

## 2022-09-03 ENCOUNTER — Ambulatory Visit (HOSPITAL_COMMUNITY): Payer: Medicare HMO | Attending: Family Medicine | Admitting: Physical Therapy

## 2022-09-03 ENCOUNTER — Encounter (HOSPITAL_COMMUNITY): Payer: Self-pay | Admitting: Physical Therapy

## 2022-09-03 DIAGNOSIS — R262 Difficulty in walking, not elsewhere classified: Secondary | ICD-10-CM

## 2022-09-03 DIAGNOSIS — M7061 Trochanteric bursitis, right hip: Secondary | ICD-10-CM | POA: Diagnosis not present

## 2022-09-03 DIAGNOSIS — M25551 Pain in right hip: Secondary | ICD-10-CM | POA: Diagnosis present

## 2022-09-03 DIAGNOSIS — M6281 Muscle weakness (generalized): Secondary | ICD-10-CM | POA: Diagnosis present

## 2022-09-03 DIAGNOSIS — R2689 Other abnormalities of gait and mobility: Secondary | ICD-10-CM | POA: Diagnosis present

## 2022-09-03 NOTE — Therapy (Signed)
OUTPATIENT PHYSICAL THERAPY LOWER EXTREMITY EVALUATION   Patient Name: Megan [name] MRN: [id number] DOB:[birth year], 71 y.o., female Today's Date: 09/03/2022   PT End of Session - 09/03/22 1220     Visit Number 1    Number of Visits 8    Date for PT Re-Evaluation 10/16/22    Authorization Type [employer] H[employer]    Authorization Time Period Auth req for 8 visits    Progress Note Due on Visit 8    PT Start Time 1519    PT Stop Time 1600    PT Time Calculation (min) 41 min    Activity Tolerance Patient tolerated treatment well    Behavior During Therapy [facility] for tasks assessed/performed             Past Medical History:  Diagnosis Date   Gout    High cholesterol    Hypertension    Kidney stone    Leukopenia 05/04/2016   Rheumatoid arthritis ([place])    Seasonal allergies    Thyroid disease    Past Surgical History:  Procedure Laterality Date   BILATERAL KNEE ARTHROSCOPY     COLONOSCOPY N/A 07/30/2014   Procedure: COLONOSCOPY;  Surgeon: Danie Binder, MD;  [facility];  Service: Endoscopy;  Laterality: N/A;  9:30 AM   high cholesterol     Right knee osteotomy     Patient Active Problem List   Diagnosis Date Noted   Trochanteric bursitis, right hip 07/30/2022   Annual physical exam 05/26/2022   Impaired vision in both eyes 05/26/2022   Elevated ferritin level 05/26/2022   PVC (premature ventricular contraction) 04/17/2022   Obesity (BMI 30-39.9) 01/23/2022   Irregular heart beat 08/21/2021   Gout 07/15/2021   Osteoporosis 02/20/2021   GERD (gastroesophageal reflux disease) 07/14/2019   Acquired renal cyst of right kidney 07/14/2019   Rheumatoid arthritis ([place]) 05/09/2019   Arthritis of carpometacarpal [facility]) joint of right thumb 12/01/2018   Medial epicondylitis of elbow, right 12/01/2018   Hypertension 05/19/2017   DDD (degenerative disc disease), lumbar 05/19/2017   Hyperlipidemia 05/19/2017   Prediabetes 05/19/2017   Iron deficiency 05/19/2017    Hypothyroidism 05/19/2017   Leukopenia 05/04/2016   Arthritis of right knee 03/01/2012   Knee pain 02/25/2012    PCP: Alvira Monday, FNP   REFERRING PROVIDER: Alvira Monday, FNP   REFERRING DIAG: M70.61 (ICD-10-CM) - Trochanteric bursitis, right hip   THERAPY DIAG:  Pain in right hip - Plan: PT plan of care cert/re-cert  Difficulty in walking, not elsewhere classified - Plan: PT plan of care cert/re-cert  Other abnormalities of gait and mobility - Plan: PT plan of care cert/re-cert  Muscle weakness (generalized) - Plan: PT plan of care cert/re-cert  Rationale for Evaluation and Treatment Rehabilitation  ONSET DATE: 2 weeks ago  SUBJECTIVE:   SUBJECTIVE STATEMENT: Patient was walking a dog about a month ago; she got pulled suddenly by the dog and felt that she twisted her hip. About two weeks later she was walking up her steps and noticed her Rt leg give out; she did not fall but was able to catch herself. There was pain at the time and has been gradually improving. There is still pain when she turns, walks, and with sitting. Improved with ibuprofen.   PERTINENT HISTORY: Hx of previous Rt proximal medial tibial osteotomy.   PAIN:  Are you having pain? Yes: NPRS scale: 1-2/10 Pain location: Rt outer hip Pain description: sore Aggravating factors: sitting, walking, turning  Relieving factors: ibuprofen  PRECAUTIONS: None  WEIGHT BEARING RESTRICTIONS No  FALLS:  Has patient fallen in last 6 months? 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  PLOF: [place] Reduce symptoms, improve ability to turn, walk dog without pain   OBJECTIVE:   DIAGNOSTIC FINDINGS:  none  PATIENT SURVEYS:  FOTO 65%  COGNITION:  Overall cognitive status: Within functional limits for tasks assessed     SENSATION: WFL   PALPATION: TTP greater trochanter Rt  LOWER EXTREMITY ROM:  Active ROM Right eval Left eval  Hip flexion 127 125  Hip extension 5 2  Hip abduction    Hip adduction    Hip internal rotation 35 30  Hip external rotation 30 24  Knee flexion    Knee extension    Ankle dorsiflexion    Ankle plantarflexion    Ankle inversion    Ankle eversion     (Blank rows = not tested)  LOWER EXTREMITY MMT:  MMT Right eval Left eval  Hip flexion 4+ 4+  Hip extension 4+ 5  Hip abduction 5 5  Hip adduction 5 5  Hip internal rotation 4 4+  Hip external rotation 4+ 4+  Knee flexion 4+ 5  Knee extension 5 5  Ankle dorsiflexion 5 5  Ankle plantarflexion    Ankle inversion    Ankle eversion     (Blank rows = not tested)  LOWER EXTREMITY SPECIAL TESTS:  Hip special tests: Saralyn Pilar (FABER) test: negative, Thomas test: negative, Hip scouring test: negative, and Anterior hip impingement test: negative  FUNCTIONAL TESTS:  5 times sit to stand: 13.1 sec 2 minute walk test: 76' no AD  GAIT: Distance walked: >13'  Assistive device utilized: None Level of assistance: Complete Independence Comments: mild lateral list towards left (hx of scoliosis) early Rt heel strike, reduced stance time on Rt, decreased Rt step length.     TODAY'S TREATMENT: 09/03/22 Eval MMT 2MWT FOTO 5x sit to stand HEP (see below) education   PATIENT EDUCATION:  Education details: Findings, HEP, symptom awareness, modalities, POC, PT role Person educated: Patient Education method: Explanation Education comprehension: verbalized understanding   HOME EXERCISE PROGRAM: Access Code: [email or web address] URL: [email or web address] Date: 09/03/2022 Prepared by: Candie Mile  Exercises - Supine Bridge  - 2 x daily - 7 x weekly - 2 sets - 10 reps - Supine  Figure 4 Piriformis Stretch  - 2 x daily - 7 x weekly - 3 sets - 30 sec hold - Hooklying Isometric Hip Abduction with Belt  - 2 x daily - 7 x weekly - 2 sets - 10 reps - 5 hold  ASSESSMENT:  CLINICAL IMPRESSION: Patient is a 71 y.o. female who was seen today for physical therapy evaluation and treatment for Rt hip pain. Patient presents with deficits including, reduced strength, limited activity tolerance, abnormalities of gait, limited hip extension ROM, and pain, contributing to impaired functional mobility with ADLs and IADLs. Patient is currently restricted in ADLs as indicated by objective and subjective functional outcome measures, as well as reported history and objective measures taken during this exam. Patient will benefit from skilled physical therapy intervention in order to improve function and reduce the impairments listed above.  OBJECTIVE IMPAIRMENTS Abnormal gait, decreased activity tolerance, decreased knowledge of condition, difficulty walking, decreased ROM, decreased strength, hypomobility, increased fascial restrictions, impaired flexibility, and pain.   ACTIVITY LIMITATIONS sitting, standing, squatting, stairs, locomotion level, and caring for others  PARTICIPATION LIMITATIONS: cleaning, shopping, community activity, occupation, and yard work  PERSONAL FACTORS Age and Time since onset of injury/illness/exacerbation are also affecting patient's functional outcome.   REHAB POTENTIAL: Excellent  CLINICAL DECISION MAKING: Stable/uncomplicated  EVALUATION COMPLEXITY: Low   GOALS: Goals reviewed with patient? Yes  SHORT TERM GOALS: Target date: 09/17/22  Patient will be independent with initial HEP and self-management strategies to improve functional outcomes Baseline: Initiated Goal status: INITIAL    2. Patient will report 50% improvement in symptoms to enable increased tolerance with ADLs and gross mobility.  Baseline:  Goal Status: INITIAL  LONG TERM  GOALS: Target date: 10/01/22  Patient will be independent with advanced HEP and self-management strategies to improve functional outcomes Baseline:  Goal status: INITIAL  2.  Patient will improve FOTO score to predicted value of to indicate improvement in functional outcomes Baseline: 65% Goal status: INITIAL  3.  Patient will report at least 80% reduction in symptoms while walking the dog, allowing for greater tolerance with functional mobility Baseline:  Goal status: INITIAL  4. Patient will have equal to or > 4+/5 MMT throughout BIL LEs to improve ability to perform functional mobility, stair ambulation and ADLs.  Baseline: see above Goal status: INITIAL  5. Patient will Improve 5x sit to stand to age related norms or better (12.6 seconds.) Baseline: 13.1 sec Goal status: INITIAL    PLAN: PT FREQUENCY: 2x/week  PT DURATION: 4 weeks  PLANNED INTERVENTIONS: Therapeutic exercises, Therapeutic activity, Neuromuscular re-education, Balance training, Gait training, Patient/Family education, Self Care, Joint mobilization, Joint manipulation, Stair training, DME instructions, Dry Needling, Electrical stimulation, Spinal manipulation, Spinal mobilization, Cryotherapy, Moist heat, Taping, Biofeedback, Ionotophoresis '4mg'$ /ml Dexamethasone, Manual therapy, and Re-evaluation  PLAN FOR NEXT SESSION: Increase proximal LE strength, if symptomatic with concentric exercises, consider isometrics and gradually work up to eccentrics as tolerated.  Candie Mile, PT, DPT Physical Therapist Acute Rehabilitation Services [facility]  09/03/2022, 4:38 PM  Candie Mile, PT, DPT Physical Therapist Acute Rehabilitation Services [facility]

## 2022-09-10 ENCOUNTER — Ambulatory Visit (HOSPITAL_COMMUNITY): Payer: Medicare HMO | Admitting: Physical Therapy

## 2022-09-11 ENCOUNTER — Ambulatory Visit (HOSPITAL_COMMUNITY): Payer: Medicare HMO | Admitting: Physical Therapy

## 2022-09-11 DIAGNOSIS — R2689 Other abnormalities of gait and mobility: Secondary | ICD-10-CM

## 2022-09-11 DIAGNOSIS — R262 Difficulty in walking, not elsewhere classified: Secondary | ICD-10-CM

## 2022-09-11 DIAGNOSIS — M6281 Muscle weakness (generalized): Secondary | ICD-10-CM

## 2022-09-11 DIAGNOSIS — M25551 Pain in right hip: Secondary | ICD-10-CM | POA: Diagnosis not present

## 2022-09-11 NOTE — Therapy (Signed)
[facility] OUTPATIENT PHYSICAL THERAPY LOWER EXTREMITY Treatment   Patient Name: Megan [name] MRN: [id number] DOB:[birth year], 71 y.o., female Today's Date: 09/11/2022   PT End of Session - 09/11/22 1155     Visit Number 2    Number of Visits 8    Date for PT Re-Evaluation 10/16/22    Authorization Type [employer] HMO    Authorization Time Period 8 approved from 10/5-11/2    Progress Note Due on Visit 8    PT Start Time 1125    PT Stop Time 1200    PT Time Calculation (min) 35 min    Activity Tolerance Patient tolerated treatment well    Behavior During Therapy [facility] for tasks assessed/performed              Past Medical History:  Diagnosis Date   Gout    High cholesterol    Hypertension    Kidney stone    Leukopenia 05/04/2016   Rheumatoid arthritis ([place])    Seasonal allergies    Thyroid disease    Past Surgical History:  Procedure Laterality Date   BILATERAL KNEE ARTHROSCOPY     COLONOSCOPY N/A 07/30/2014   Procedure: COLONOSCOPY;  Surgeon: Danie Binder, MD;  [facility];  Service: Endoscopy;  Laterality: N/A;  9:30 AM   high cholesterol     Right knee osteotomy     Patient Active Problem List   Diagnosis Date Noted   Trochanteric bursitis, right hip 07/30/2022   Annual physical exam 05/26/2022   Impaired vision in both eyes 05/26/2022   Elevated ferritin level 05/26/2022   PVC (premature ventricular contraction) 04/17/2022   Obesity (BMI 30-39.9) 01/23/2022   Irregular heart beat 08/21/2021   Gout 07/15/2021   Osteoporosis 02/20/2021   GERD (gastroesophageal reflux disease) 07/14/2019   Acquired renal cyst of right kidney 07/14/2019   Rheumatoid arthritis ([place]) 05/09/2019   Arthritis of carpometacarpal [facility]) joint of right thumb 12/01/2018   Medial epicondylitis of elbow, right 12/01/2018   Hypertension 05/19/2017   DDD (degenerative disc disease), lumbar 05/19/2017   Hyperlipidemia 05/19/2017    Prediabetes 05/19/2017   Iron deficiency 05/19/2017   Hypothyroidism 05/19/2017   Leukopenia 05/04/2016   Arthritis of right knee 03/01/2012   Knee pain 02/25/2012    PCP: Alvira Monday, FNP   REFERRING PROVIDER: Alvira Monday, FNP   REFERRING DIAG: M70.61 (ICD-10-CM) - Trochanteric bursitis, right hip   THERAPY DIAG:  Pain in right hip  Difficulty in walking, not elsewhere classified  Other abnormalities of gait and mobility  Muscle weakness (generalized)  Rationale for Evaluation and Treatment Rehabilitation  ONSET DATE: 2 weeks ago  SUBJECTIVE:   SUBJECTIVE STATEMENT: Pt states that her hip really doesn't bother her until she makes a wrong turn.  No questions with the HEP but she did not have time to do them as she has been sick.    PERTINENT HISTORY: Hx of previous Rt proximal medial tibial osteotomy.   PAIN:  Are you having pain? 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  PLOF: Independent  PATIENT GOALS Reduce symptoms, improve ability to turn, walk dog without pain   OBJECTIVE:   DIAGNOSTIC FINDINGS: none  PATIENT SURVEYS:  FOTO 65%  PALPATION: TTP greater trochanter Rt  LOWER EXTREMITY ROM:  Active ROM Right eval Left eval  Hip flexion 127  125  Hip extension 5 2  Hip abduction    Hip adduction    Hip internal rotation 35 30  Hip external rotation 30 24  Knee flexion    Knee extension    Ankle dorsiflexion    Ankle plantarflexion    Ankle inversion    Ankle eversion     (Blank rows = not tested)  LOWER EXTREMITY MMT:  MMT Right eval Left eval  Hip flexion 4+ 4+  Hip extension 4+ 5  Hip abduction 5 5  Hip adduction 5 5  Hip internal rotation 4 4+  Hip external rotation 4+ 4+  Knee flexion 4+ 5  Knee extension 5 5  Ankle dorsiflexion 5 5  Ankle plantarflexion     Ankle inversion    Ankle eversion     (Blank rows = not tested)  LOWER EXTREMITY SPECIAL TESTS:  Hip special tests: Saralyn Pilar (FABER) test: negative, Thomas test: negative, Hip scouring test: negative, and Anterior hip impingement test: negative  FUNCTIONAL TESTS:  5 times sit to stand: 13.1 sec 2 minute walk test: 14' no AD  GAIT: Distance walked: >450'  Assistive device utilized: None Level of assistance: Complete Independence Comments: mild lateral list towards left (hx of scoliosis) early Rt heel strike, reduced stance time on Rt, decreased Rt step length.     TODAY'S TREATMENT: 09/11/2022 Reviewed evaluation and goals. Wall arch x 10 Mini squat x 10 Rocker board x 2 minute  Side stepping x 2 RT  Sitting: Ab set x 10 Sit to stand x 10  Supine:   Knee to chest 3 x 30"  Bridge x 10 Glut set x 10  Standing:   09/03/22 Eval MMT 2MWT FOTO 5x sit to stand HEP (see below) education   PATIENT EDUCATION:             09/10/22: updated HEP Education details: Findings, HEP, symptom awareness, modalities, POC, PT role Person educated: Patient Education method: Explanation Education comprehension: verbalized understanding   HOME EXERCISE PROGRAM:              10/12             - Supine Gluteal Sets  - 2 x daily - 7 x weekly - 1 sets - 10 reps - 3-5" hold              - Mini Squat with Counter Support  - 2 x daily - 7 x weekly - 1 sets - 10 reps - 3-5" hold              - Side Stepping with Counter Support  - 2 x daily - 7 x weekly - 1 sets - 10 reps - Seated Piriformis Stretch  - 2 x daily - 7 x weekly - 1 sets - 3 reps - 30" hold - Seated Transversus Abdominis Bracing with PLB  - 3 x daily - 7 x weekly - 1 sets - 10 reps - 5 seconds  hold - Heel Raises with Counter Support  - 2 x daily - 7 x weekly - 1 sets - 10 reps - 5 seconds  holdAccess Code: [email or web address] URL: [email or web address] Date: 09/03/2022 Prepared by: Candie Mile  Exercises - Supine  Bridge  - 2 x daily - 7 x weekly - 2 sets - 10 reps - Supine Figure 4 Piriformis Stretch  - 2 x daily - 7 x weekly - 3 sets - 30 sec hold - Hooklying Isometric Hip Abduction with Belt  -  2 x daily - 7 x weekly - 2 sets - 10 reps - 5 hold  ASSESSMENT:  CLINICAL IMPRESSION: Evaluation and goals reviewed with patient.  Pt was late for appointment thererfore treatment was limited.  Added streteching and strengthening for both back and hip as pt is walking in a forward bent position which is adding to hip stress.  Patient is currently restricted in ADLs as indicated by objective and subjective functional outcome measures, as well as reported history and objective measures taken during this exam. Patient will benefit from skilled physical therapy intervention in order to improve function and reduce the impairments listed above.     OBJECTIVE IMPAIRMENTS Abnormal gait, decreased activity tolerance, decreased knowledge of condition, difficulty walking, decreased ROM, decreased strength, hypomobility, increased fascial restrictions, impaired flexibility, and pain.   ACTIVITY LIMITATIONS sitting, standing, squatting, stairs, locomotion level, and caring for others  PARTICIPATION LIMITATIONS: cleaning, shopping, community activity, occupation, and yard work  PERSONAL FACTORS Age and Time since onset of injury/illness/exacerbation are also affecting patient's functional outcome.   REHAB POTENTIAL: Excellent  CLINICAL DECISION MAKING: Stable/uncomplicated  EVALUATION COMPLEXITY: Low   GOALS: Goals reviewed with patient? Yes  SHORT TERM GOALS: Target date: 09/17/22  Patient will be independent with initial HEP and self-management strategies to improve functional outcomes Baseline: Initiated Goal status: on-going   2. Patient will report 50% improvement in symptoms to enable increased tolerance with ADLs and gross mobility.  Baseline:  Goal Status: on-going   LONG TERM GOALS: Target date:  10/01/22  Patient will be independent with advanced HEP and self-management strategies to improve functional outcomes Baseline:  Goal status: on going   2.  Patient will improve FOTO score to predicted value of to indicate improvement in functional outcomes Baseline: 65% Goal status: IN PROGRESS  3.  Patient will report at least 80% reduction in symptoms while walking the dog, allowing for greater tolerance with functional mobility Baseline:  Goal status: IN PROGRESS  4. Patient will have equal to or > 4+/5 MMT throughout BIL LEs to improve ability to perform functional mobility, stair ambulation and ADLs.  Baseline: see above Goal status: IN PROGRESS  5. Patient will Improve 5x sit to stand to age related norms or better (12.6 seconds.) Baseline: 13.1 sec Goal status: IN PROGRESS    PLAN: PT FREQUENCY: 2x/week  PT DURATION: 4 weeks  PLANNED INTERVENTIONS: Therapeutic exercises, Therapeutic activity, Neuromuscular re-education, Balance training, Gait training, Patient/Family education, Self Care, Joint mobilization, Joint manipulation, Stair training, DME instructions, Dry Needling, Electrical stimulation, Spinal manipulation, Spinal mobilization, Cryotherapy, Moist heat, Taping, Biofeedback, Ionotophoresis '4mg'$ /ml Dexamethasone, Manual therapy, and Re-evaluation  PLAN FOR NEXT SESSION: begin balance as pt is fearful of falling.  Increase proximal LE strength, if symptomatic with concentric exercises, consider isometrics and gradually work up to eccentrics as tolerated.   Rayetta Humphrey, [place] CLT [phone]

## 2022-09-15 ENCOUNTER — Encounter: Payer: Self-pay | Admitting: Family Medicine

## 2022-09-15 ENCOUNTER — Ambulatory Visit (INDEPENDENT_AMBULATORY_CARE_PROVIDER_SITE_OTHER): Payer: Medicare HMO | Admitting: Family Medicine

## 2022-09-15 VITALS — BP 130/86 | HR 72 | Ht 65.0 in | Wt 173.1 lb

## 2022-09-15 DIAGNOSIS — J0100 Acute maxillary sinusitis, unspecified: Secondary | ICD-10-CM | POA: Diagnosis not present

## 2022-09-15 MED ORDER — AMOXICILLIN-POT CLAVULANATE 875-125 MG PO TABS
1.0000 | ORAL_TABLET | Freq: Two times a day (BID) | ORAL | 0 refills | Status: AC
Start: 2022-09-15 — End: 2022-09-20

## 2022-09-15 MED ORDER — AZELASTINE HCL 0.1 % NA SOLN: 2.0000 | Freq: Two times a day (BID) | NASAL | 12 refills | Status: DC

## 2022-09-15 NOTE — Assessment & Plan Note (Signed)
Symptom onset on 09/08/2022 Will treat with Augmentin for 5 days Azelastine nasal spray order for nasal congestion Encouraged to take Tylenol as needed for headaches and body aches Encouraged to call if her symptoms worsen or if unrelieved after her antibiotic treatment

## 2022-09-15 NOTE — Patient Instructions (Addendum)
I appreciate the opportunity to provide care to you today!    Follow up:  11/25/22   Please pick up your medications at the pharmacy    Please continue to a heart-healthy diet and increase your physical activities. Try to exercise for 77mns at least three times a week.      It was a pleasure to see you and I look forward to continuing to work together on your health and well-being. Please do not hesitate to call the office if you need care or have questions about your care.   Have a wonderful day and week. With Gratitude, GAlvira MondayMSN, FNP-BC

## 2022-09-15 NOTE — Progress Notes (Signed)
   Acute Office Visit  Subjective:     Patient ID: Megan [name], female    DOB: [birth year], 71 y.o.   MRN: [id number]  Chief Complaint  Patient presents with   Sinus Problem    Pt reports allergies, coughing up yellow/green mucus since 09/08/22. Last week sx were worse.     HPI Patient is in today with complaints of nasal congestion with yellow-green phlegm, cough that is increased at nighttime, sinus pressure, and pain.  She denies sore throat, body aches, and fever.  She reports taking TheraFlu, and DayQuil with minimal relief of her symptoms.  Onset of symptoms on 09/08/2022.   Review of Systems  Constitutional:  Negative for chills and fever.  HENT:  Positive for congestion and sinus pain. Negative for hearing loss and sore throat.   Respiratory:  Positive for cough and sputum production.   Cardiovascular:  Negative for chest pain and palpitations.  Gastrointestinal:  Negative for nausea and vomiting.        Objective:    BP 130/86   Pulse 72   Ht '5\' 5"'$  (1.651 m)   Wt 173 lb 1.9 oz (78.5 kg)   SpO2 98%   BMI 28.81 kg/m    Physical Exam HENT:     Head: Normocephalic.     Right Ear: External ear normal.     Left Ear: External ear normal.     Nose:     Right Sinus: Maxillary sinus tenderness present.     Left Sinus: Maxillary sinus tenderness present.  Cardiovascular:     Rate and Rhythm: Normal rate and regular rhythm.     Pulses: Normal pulses.     Heart sounds: Normal heart sounds.  Pulmonary:     Effort: Pulmonary effort is normal.     Breath sounds: Normal breath sounds.  Neurological:     Mental Status: She is alert.     No results found for any visits on 09/15/22.      Assessment & Plan:   Problem List Items Addressed This Visit       Respiratory   Sinusitis, acute, maxillary - Primary    Symptom onset on 09/08/2022 Will treat with Augmentin for 5 days Azelastine nasal spray order for nasal congestion Encouraged to take Tylenol as needed  for headaches and body aches Encouraged to call if her symptoms worsen or if unrelieved after her antibiotic treatment      Relevant Medications   amoxicillin-clavulanate (AUGMENTIN) 875-125 MG tablet   azelastine (ASTELIN) 0.1 % nasal spray    Meds ordered this encounter  Medications   amoxicillin-clavulanate (AUGMENTIN) 875-125 MG tablet    Sig: Take 1 tablet by mouth 2 (two) times daily for 5 days.    Dispense:  10 tablet    Refill:  0   azelastine (ASTELIN) 0.1 % nasal spray    Sig: Place 2 sprays into both nostrils 2 (two) times daily. Use in each nostril as directed    Dispense:  30 mL    Refill:  12    Return if symptoms worsen or fail to improve.  Alvira Monday, FNP

## 2022-09-24 ENCOUNTER — Encounter (HOSPITAL_COMMUNITY): Payer: Self-pay

## 2022-09-24 ENCOUNTER — Ambulatory Visit (HOSPITAL_COMMUNITY): Payer: Medicare HMO

## 2022-09-24 DIAGNOSIS — M6281 Muscle weakness (generalized): Secondary | ICD-10-CM

## 2022-09-24 DIAGNOSIS — M25551 Pain in right hip: Secondary | ICD-10-CM | POA: Diagnosis not present

## 2022-09-24 DIAGNOSIS — R2689 Other abnormalities of gait and mobility: Secondary | ICD-10-CM

## 2022-09-24 DIAGNOSIS — R262 Difficulty in walking, not elsewhere classified: Secondary | ICD-10-CM

## 2022-09-24 NOTE — Therapy (Signed)
[place] OUTPATIENT PHYSICAL THERAPY LOWER EXTREMITY Treatment   Patient Name: Megan [name] MRN: [id number] DOB:09-Feb-1951, 71 y.o., female Today's Date: 09/24/2022   PT End of Session - 09/24/22 1704     Visit Number 3    Number of Visits 8    Date for PT Re-Evaluation 10/16/22    Authorization Type [employer] HMO    Authorization Time Period 8 approved from 10/5-11/2    Progress Note Due on Visit 8    PT Start Time 1520    PT Stop Time 1558    PT Time Calculation (min) 38 min    Activity Tolerance Patient tolerated treatment well    Behavior During Therapy [facility] for tasks assessed/performed               Past Medical History:  Diagnosis Date   Gout    High cholesterol    Hypertension    Kidney stone    Leukopenia 05/04/2016   Rheumatoid arthritis ([place])    Seasonal allergies    Thyroid disease    Past Surgical History:  Procedure Laterality Date   BILATERAL KNEE ARTHROSCOPY     COLONOSCOPY N/A 07/30/2014   Procedure: COLONOSCOPY;  Surgeon: Danie Binder, MD;  [facility];  Service: Endoscopy;  Laterality: N/A;  9:30 AM   high cholesterol     Right knee osteotomy     Patient Active Problem List   Diagnosis Date Noted   Sinusitis, acute, maxillary 09/15/2022   Trochanteric bursitis, right hip 07/30/2022   Annual physical exam 05/26/2022   Impaired vision in both eyes 05/26/2022   Elevated ferritin level 05/26/2022   PVC (premature ventricular contraction) 04/17/2022   Obesity (BMI 30-39.9) 01/23/2022   Irregular heart beat 08/21/2021   Gout 07/15/2021   Osteoporosis 02/20/2021   GERD (gastroesophageal reflux disease) 07/14/2019   Acquired renal cyst of right kidney 07/14/2019   Rheumatoid arthritis ([place]) 05/09/2019   Arthritis of carpometacarpal [facility]) joint of right thumb 12/01/2018   Medial epicondylitis of elbow, right 12/01/2018   Hypertension 05/19/2017   DDD (degenerative disc disease), lumbar  05/19/2017   Hyperlipidemia 05/19/2017   Prediabetes 05/19/2017   Iron deficiency 05/19/2017   Hypothyroidism 05/19/2017   Leukopenia 05/04/2016   Arthritis of right knee 03/01/2012   Knee pain 02/25/2012    PCP: Alvira Monday, FNP   REFERRING PROVIDER: Alvira Monday, FNP   REFERRING DIAG: M70.61 (ICD-10-CM) - Trochanteric bursitis, right hip   THERAPY DIAG:  Pain in right hip  Difficulty in walking, not elsewhere classified  Other abnormalities of gait and mobility  Muscle weakness (generalized)  Rationale for Evaluation and Treatment Rehabilitation  ONSET DATE: 2 weeks ago  SUBJECTIVE:   SUBJECTIVE STATEMENT:  Pt reports she has been too busy to complete her HEP and plans to go to [place] to visit son for next week.  No reoprts of pain in hip today, does have some LBP on Left side.    PERTINENT HISTORY: Hx of previous Rt proximal medial tibial osteotomy.   PAIN:  Are you having pain? 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  PLOF: Independent  PATIENT GOALS Reduce symptoms, improve ability to turn, walk dog without pain   OBJECTIVE:   DIAGNOSTIC FINDINGS: none  PATIENT SURVEYS:  FOTO 65%  PALPATION: TTP greater trochanter Rt  LOWER EXTREMITY ROM:  Active ROM Right eval Left eval  Hip flexion 127 125  Hip extension 5 2  Hip abduction    Hip adduction    Hip internal rotation 35 30  Hip external rotation 30 24  Knee flexion    Knee extension    Ankle dorsiflexion    Ankle plantarflexion    Ankle inversion    Ankle eversion     (Blank rows = not tested)  LOWER EXTREMITY MMT:  MMT Right eval Left eval  Hip flexion 4+ 4+  Hip extension 4+ 5  Hip abduction 5 5  Hip adduction 5 5  Hip internal rotation 4 4+  Hip external rotation 4+ 4+  Knee flexion 4+ 5  Knee extension 5 5   Ankle dorsiflexion 5 5  Ankle plantarflexion    Ankle inversion    Ankle eversion     (Blank rows = not tested)  LOWER EXTREMITY SPECIAL TESTS:  Hip special tests: Saralyn Pilar (FABER) test: negative, Thomas test: negative, Hip scouring test: negative, and Anterior hip impingement test: negative  FUNCTIONAL TESTS:  5 times sit to stand: 13.1 sec 2 minute walk test: 48' no AD  GAIT: Distance walked: >450'  Assistive device utilized: None Level of assistance: Complete Independence Comments: mild lateral list towards left (hx of scoliosis) early Rt heel strike, reduced stance time on Rt, decreased Rt step length.     TODAY'S TREATMENT: 09/24/22 Marching with ab set 15x 3" holds Squat front of chair with cueing for mechanics Lumbar extension 10x Wall arch with heel raise 10x 3" Shoulder extension with theraband 10x  (Attempted with marching, unable) Sidestep with RTB around thigh 2RT in hallway (~54f) Partial tandem stance 2x 30"   09/11/2022 Reviewed evaluation and goals. Wall arch x 10 Mini squat x 10 Rocker board x 2 minute  Side stepping x 2 RT  Sitting: Ab set x 10 Sit to stand x 10  Supine:   Knee to chest 3 x 30"  Bridge x 10 Glut set x 10  Standing:   09/03/22 Eval MMT 2MWT FOTO 5x sit to stand HEP (see below) education   PATIENT EDUCATION:             09/10/22: updated HEP Education details: Findings, HEP, symptom awareness, modalities, POC, PT role Person educated: Patient Education method: Explanation Education comprehension: verbalized understanding   HOME EXERCISE PROGRAM:  Access code: [email or web address]             09/24/22:  Tandem stance 10/12             - Supine Gluteal Sets  - 2 x daily - 7 x weekly - 1 sets - 10 reps - 3-5" hold              - Mini Squat with Counter Support  - 2 x daily - 7 x weekly - 1 sets - 10 reps - 3-5" hold              - Side Stepping with Counter Support  - 2 x daily - 7 x weekly - 1 sets - 10 reps - Seated  Piriformis Stretch  - 2 x daily - 7 x weekly - 1 sets - 3 reps - 30" hold - Seated Transversus Abdominis Bracing with PLB  - 3 x daily - 7 x weekly - 1 sets - 10 reps - 5 seconds  hold - Heel Raises with Counter Support  - 2 x daily - 7 x weekly -  1 sets - 10 reps - 5 seconds  holdAccess Code: [email or web address] URL: [email or web address] Date: 09/03/2022 Prepared by: Megan [name]  Exercises - Supine Bridge  - 2 x daily - 7 x weekly - 2 sets - 10 reps - Supine Figure 4 Piriformis Stretch  - 2 x daily - 7 x weekly - 3 sets - 30 sec hold - Hooklying Isometric Hip Abduction with Belt  - 2 x daily - 7 x weekly - 2 sets - 10 reps - 5 hold  ASSESSMENT:  CLINICAL IMPRESSION: Reviewed importance of HEP compliance for maximal benefits, encouraged pt to complete exercises throughout her day for completion vs finding a specific time to complete all exercises to increased compliance.  Pt presents with forward bend posture with reports of increased LBP, educated importance of posture to reduce stress on hip and LBP and to assist with balance.  Cueing required through session to address posture.  Session focus with functional strengthening, balance and posture strengthening.  HHA required during SLS and NBOS activities due to fear of falling and inability to hold posture.  Pt tolerated well to session with reports of pain resolved at EOS.  Reviewed current HEP with additional tandem stance added, pt given additional printout and folder to keep up with.  Pt has plans to visit son in [place] next week, encouraged to complete exercises while away.     OBJECTIVE IMPAIRMENTS Abnormal gait, decreased activity tolerance, decreased knowledge of condition, difficulty walking, decreased ROM, decreased strength, hypomobility, increased fascial restrictions, impaired flexibility, and pain.   ACTIVITY LIMITATIONS sitting, standing, squatting, stairs, locomotion level, and caring for others  PARTICIPATION  LIMITATIONS: cleaning, shopping, community activity, occupation, and yard work  PERSONAL FACTORS Age and Time since onset of injury/illness/exacerbation are also affecting patient's functional outcome.   REHAB POTENTIAL: Excellent  CLINICAL DECISION MAKING: Stable/uncomplicated  EVALUATION COMPLEXITY: Low   GOALS: Goals reviewed with patient? Yes  SHORT TERM GOALS: Target date: 09/17/22  Patient will be independent with initial HEP and self-management strategies to improve functional outcomes Baseline: Initiated Goal status: on-going   2. Patient will report 50% improvement in symptoms to enable increased tolerance with ADLs and gross mobility.  Baseline:  Goal Status: on-going   LONG TERM GOALS: Target date: 10/01/22  Patient will be independent with advanced HEP and self-management strategies to improve functional outcomes Baseline:  Goal status: on going   2.  Patient will improve FOTO score to predicted value of to indicate improvement in functional outcomes Baseline: 65% Goal status: IN PROGRESS  3.  Patient will report at least 80% reduction in symptoms while walking the dog, allowing for greater tolerance with functional mobility Baseline:  Goal status: IN PROGRESS  4. Patient will have equal to or > 4+/5 MMT throughout BIL LEs to improve ability to perform functional mobility, stair ambulation and ADLs.  Baseline: see above Goal status: IN PROGRESS  5. Patient will Improve 5x sit to stand to age related norms or better (12.6 seconds.) Baseline: 13.1 sec Goal status: IN PROGRESS    PLAN: PT FREQUENCY: 2x/week  PT DURATION: 4 weeks  PLANNED INTERVENTIONS: Therapeutic exercises, Therapeutic activity, Neuromuscular re-education, Balance training, Gait training, Patient/Family education, Self Care, Joint mobilization, Joint manipulation, Stair training, DME instructions, Dry Needling, Electrical stimulation, Spinal manipulation, Spinal mobilization, Cryotherapy,  Moist heat, Taping, Biofeedback, Ionotophoresis '4mg'$ /ml Dexamethasone, Manual therapy, and Re-evaluation  PLAN FOR NEXT SESSION: begin balance as pt is fearful of falling.  Increase proximal LE strength, if symptomatic with concentric  exercises, consider isometrics and gradually work up to eccentrics as tolerated.   Ihor Austin, LPTA/CLT; Delana Meyer [phone]

## 2022-09-25 ENCOUNTER — Encounter (HOSPITAL_COMMUNITY): Payer: Medicare HMO | Admitting: Physical Therapy

## 2022-09-30 ENCOUNTER — Encounter (HOSPITAL_COMMUNITY): Payer: Medicare HMO

## 2022-10-02 ENCOUNTER — Encounter (HOSPITAL_COMMUNITY): Payer: Medicare HMO

## 2022-10-06 ENCOUNTER — Encounter (HOSPITAL_COMMUNITY): Payer: Medicare HMO | Admitting: Physical Therapy

## 2022-10-06 ENCOUNTER — Telehealth (HOSPITAL_COMMUNITY): Payer: Self-pay | Admitting: Physical Therapy

## 2022-10-06 NOTE — Telephone Encounter (Signed)
Pt did not show for appt.  Unable to reach by phone.  Teena Irani, PTA/CLT [place] Ph: [phone]

## 2022-10-08 ENCOUNTER — Encounter (HOSPITAL_COMMUNITY): Payer: Medicare HMO | Admitting: Physical Therapy

## 2022-10-14 ENCOUNTER — Encounter (HOSPITAL_COMMUNITY): Payer: Medicare HMO | Admitting: Physical Therapy

## 2022-10-26 ENCOUNTER — Encounter: Payer: Self-pay | Admitting: Internal Medicine

## 2022-10-26 ENCOUNTER — Ambulatory Visit (INDEPENDENT_AMBULATORY_CARE_PROVIDER_SITE_OTHER): Payer: Medicare HMO | Admitting: Internal Medicine

## 2022-10-26 VITALS — BP 117/77 | HR 83 | Ht 64.0 in | Wt 173.0 lb

## 2022-10-26 DIAGNOSIS — M109 Gout, unspecified: Secondary | ICD-10-CM

## 2022-10-26 MED ORDER — PREDNISONE 20 MG PO TABS: 40.0000 mg | ORAL_TABLET | Freq: Every day | ORAL | 0 refills | Status: AC

## 2022-10-26 NOTE — Patient Instructions (Signed)
It was a pleasure to see you today.  Thank you for giving [place] the opportunity to be involved in your care.  Below is a brief recap of your visit and next steps.  We will plan to see you again on 11/25/22.  Summary I have prescribed prednisone 40 mg daily x 7 days for treatment of gout flare You have follow up with Peter Congo on 11/25/22

## 2022-10-26 NOTE — Assessment & Plan Note (Signed)
Today for evaluation of left lateral ankle pain that began the day after Thanksgiving.  She has a history of gout flares previously.  On exam today she has swelling and erythema present on the left lateral ankle.  ROM is intact. -I have tried prednisone 40 mg x 7 days -She has previously scheduled follow-up next month (12/27).

## 2022-10-26 NOTE — Progress Notes (Signed)
   Acute Office Visit  Subjective:     Patient ID: Megan [name], female    DOB: Sep 14, 1951, 71 y.o.   MRN: [id number]  Chief Complaint  Patient presents with   Gout    Left foot. Started 10/23/2022   Ms. Mable presents today for an acute visit for left lateral ankle pain.  She endorses onset of pain on Friday 11/24.  She has a history of gout and endorses annual flares after holiday meals.  She reports eating [place] living on Thanksgiving, which she believes triggered her symptoms.  She has been applying CBD oil, which has slightly improved her pain.  She denies fever/chills currently.  She is not on a preventative medication for gout currently.  Ms. Nouri has previously responded well to prednisone for treatment of gout flares.  Review of Systems  Musculoskeletal:  Positive for joint pain (Left lateral ankle pain).  All other systems reviewed and are negative.     Objective:    BP 117/77   Pulse 83   Ht '5\' 4"'$  (1.626 m)   Wt 173 lb (78.5 kg)   SpO2 96%   BMI 29.70 kg/m   Physical Exam Musculoskeletal:        General: Swelling (Swelling erythema present over the left lateral ankle) and tenderness present.       Assessment & Plan:   Problem List Items Addressed This Visit       Acute gout of left ankle - Primary    Presenting today for evaluation of left lateral ankle pain that began the day after Thanksgiving.  She has a history of gout flares previously.  On exam today she has swelling and erythema present on the left lateral ankle, which is consistent with prior presentations for gout flares.  ROM is intact. -I have prescribed prednisone 40 mg x 7 days -She has previously scheduled follow-up next month (12/27).      Meds ordered this encounter  Medications   predniSONE (DELTASONE) 20 MG tablet    Sig: Take 2 tablets (40 mg total) by mouth daily for 7 days.    Dispense:  14 tablet    Refill:  0    Return if symptoms worsen or fail to improve.  Johnette Abraham,  MD

## 2022-11-25 ENCOUNTER — Ambulatory Visit (INDEPENDENT_AMBULATORY_CARE_PROVIDER_SITE_OTHER): Payer: Medicare HMO | Admitting: Family Medicine

## 2022-11-25 ENCOUNTER — Ambulatory Visit: Payer: Medicare HMO | Admitting: Nurse Practitioner

## 2022-11-25 ENCOUNTER — Encounter: Payer: Self-pay | Admitting: Family Medicine

## 2022-11-25 VITALS — BP 130/77 | HR 80 | Ht 64.0 in | Wt 178.0 lb

## 2022-11-25 DIAGNOSIS — I10 Essential (primary) hypertension: Secondary | ICD-10-CM

## 2022-11-25 DIAGNOSIS — M1711 Unilateral primary osteoarthritis, right knee: Secondary | ICD-10-CM

## 2022-11-25 DIAGNOSIS — E7849 Other hyperlipidemia: Secondary | ICD-10-CM | POA: Diagnosis not present

## 2022-11-25 DIAGNOSIS — R7301 Impaired fasting glucose: Secondary | ICD-10-CM | POA: Diagnosis not present

## 2022-11-25 DIAGNOSIS — E038 Other specified hypothyroidism: Secondary | ICD-10-CM

## 2022-11-25 DIAGNOSIS — N3944 Nocturnal enuresis: Secondary | ICD-10-CM | POA: Diagnosis not present

## 2022-11-25 DIAGNOSIS — E559 Vitamin D deficiency, unspecified: Secondary | ICD-10-CM

## 2022-11-25 DIAGNOSIS — M10072 Idiopathic gout, left ankle and foot: Secondary | ICD-10-CM | POA: Diagnosis not present

## 2022-11-25 MED ORDER — MIRABEGRON ER 25 MG PO TB24: 25.0000 mg | ORAL_TABLET | Freq: Every day | ORAL | 1 refills | Status: DC

## 2022-11-25 NOTE — Assessment & Plan Note (Signed)
Stable without medication Will assess thyroid levels today

## 2022-11-25 NOTE — Assessment & Plan Note (Signed)
Chronic condition Discussed starting gout prophylactics treatment Patient reports that she does not like taking daily medications and will likely be noncompliant Reviewed low purine foods with the patient Patient verbalized understanding

## 2022-11-25 NOTE — Assessment & Plan Note (Signed)
She reports not taking atorvastatin 10 mg for about 2 months She reports taking kyolic cholesterol health supplement She denies muscle aches and pain Will assess cholesterol panel today

## 2022-11-25 NOTE — Assessment & Plan Note (Addendum)
S/p HTO  2007 She reports a sensation of pressure oin her right knee since gaining weight She reports that she is 40 pounds over her normal weight She reports decreased physical activities, however she intends to resume increasing her physical activities No effusion, redness, or tenderness to palpation noted Range of motion is intact Encourage conservative management and follow-up with orthopedics as needed

## 2022-11-25 NOTE — Progress Notes (Signed)
Established Patient Office Visit  Subjective:  Patient ID: Megan [name], female    DOB: 1951/04/01  Age: 71 y.o. MRN: [id number]  CC:  Chief Complaint  Patient presents with   Follow-up    6 month f/u. Would like to discuss gout flare ups.     HPI Megan [name] is a 71 y.o. female with past medical history of hypertension, gout, GERD, hypothyroidism presents for f/u of  chronic medical conditions. For the details of today's visit, please refer to the assessment and plan.     Past Medical History:  Diagnosis Date   Gout    High cholesterol    Hypertension    Kidney stone    Leukopenia 05/04/2016   Rheumatoid arthritis ([place])    Seasonal allergies    Thyroid disease     Past Surgical History:  Procedure Laterality Date   BILATERAL KNEE ARTHROSCOPY     COLONOSCOPY N/A 07/30/2014   Procedure: COLONOSCOPY;  Surgeon: Danie Binder, MD;  [facility];  Service: Endoscopy;  Laterality: N/A;  9:30 AM   high cholesterol     Right knee osteotomy      Family History  Problem Relation Age of Onset   Hypertension Mother    Asthma Mother    Other Father        Rare blood disease   Hypertension Brother    Colon cancer Paternal Uncle    Bladder Cancer Neg Hx    Kidney cancer Neg Hx     Social History   Socioeconomic History   Marital status: Married    Spouse name: Not on file   Number of children: 4   Years of education: Not on file   Highest education level: Not on file  Occupational History   Occupation: Retired; Works PT in [employer] at [employer] in [place] Use   Smoking status: Never   Smokeless tobacco: Never  Vaping Use   Vaping Use: Never used  Substance and Sexual Activity   Alcohol use: No   Drug use: No    Comment: used CBD oil   Sexual activity: Yes  Other Topics Concern   Not on file  Social History Narrative   3 biological children and 1 adopted   Social Determinants of Health   Financial Resource Strain: Low Risk   (02/02/2022)   Overall Financial Resource Strain (CARDIA)    Difficulty of Paying Living Expenses: Not very hard  Food Insecurity: No Food Insecurity (02/02/2022)   Hunger Vital Sign    Worried About Running Out of Food in the Last Year: Never true    Ran Out of Food in the Last Year: Never true  Transportation Needs: No Transportation Needs (02/02/2022)   PRAPARE - Hydrologist (Medical): No    Lack of Transportation (Non-Medical): No  Physical Activity: Insufficiently Active (02/02/2022)   Exercise Vital Sign    Days of Exercise per Week: 4 days    Minutes of Exercise per Session: 30 min  Stress: No Stress Concern Present (02/02/2022)   [place]    Feeling of Stress : Not at all  Social Connections: Moderately Integrated (02/02/2022)   Social Connection and Isolation Panel [NHANES]    Frequency of Communication with Friends and Family: Three times a week    Frequency of Social Gatherings with Friends and Family: Twice a week    Attends Religious Services:  1 to 4 times per year    Active Member of Clubs or Organizations: No    Attends Archivist Meetings: Never    Marital Status: Married  Human resources officer Violence: Not At Risk (02/02/2022)   Humiliation, Afraid, Rape, and Kick questionnaire    Fear of Current or Ex-Partner: No    Emotionally Abused: No    Physically Abused: No    Sexually Abused: No    Outpatient Medications Prior to Visit  Medication Sig Dispense Refill   amLODipine (NORVASC) 5 MG tablet Take 1 tablet (5 mg total) by mouth daily. 90 tablet 1   atorvastatin (LIPITOR) 10 MG tablet Take 1 tablet (10 mg total) by mouth daily. 90 tablet 2   CINNAMON PO Take by mouth. As needed     GARLIC-LECITHIN PO Take by mouth.     Ginger, Zingiber officinalis, (GINGER PO) Take by mouth. As needed     Magnesium 200 MG TABS Take by mouth.     Misc Natural Products (GINKOGIN PO) Take by  mouth.     Multiple Vitamins-Minerals (ONE-A-DAY WOMENS PO) Take by mouth. As needed     UNABLE TO FIND Med Name: Celery seed tab 1,070m. Takes once weekly.     No facility-administered medications prior to visit.    Allergies  Allergen Reactions   Other Other (See Comments)    Nuts - pt states sometimes makes her gout flare up   Hydrocodone     Makes her feel bad   Naproxen     Makes her feel bad     ROS Review of Systems  Constitutional:  Negative for chills and fever.  Eyes:  Negative for visual disturbance.  Respiratory:  Negative for chest tightness and shortness of breath.   Musculoskeletal:        Right knee discomfort  Neurological:  Negative for dizziness and headaches.      Objective:    Physical Exam HENT:     Head: Normocephalic.     Mouth/Throat:     Mouth: Mucous membranes are moist.  Cardiovascular:     Rate and Rhythm: Normal rate.     Heart sounds: Normal heart sounds.  Pulmonary:     Effort: Pulmonary effort is normal.     Breath sounds: Normal breath sounds.  Musculoskeletal:     Right knee: No swelling or effusion. Normal range of motion.  Neurological:     Mental Status: She is alert.     BP 130/77   Pulse 80   Ht _0  (1.626 m)   Wt 178 lb 0.6 oz (80.8 kg)   SpO2 94%   BMI 30.56 kg/m  Wt Readings from Last 3 Encounters:  11/25/22 178 lb 0.6 oz (80.8 kg)  10/26/22 173 lb (78.5 kg)  09/15/22 173 lb 1.9 oz (78.5 kg)    Lab Results  Component Value Date   TSH 1.180 05/26/2022   Lab Results  Component Value Date   WBC 3.5 05/26/2022   HGB 12.4 05/26/2022   HCT 35.0 05/26/2022   MCV 86 05/26/2022   PLT 200 05/26/2022   Lab Results  Component Value Date   NA 140 05/26/2022   K 4.1 05/26/2022   CO2 23 05/26/2022   GLUCOSE 93 05/26/2022   BUN 9 05/26/2022   CREATININE 0.66 05/26/2022   BILITOT 0.4 05/26/2022   ALKPHOS 110 05/26/2022   AST 19 05/26/2022   ALT 14 05/26/2022   PROT 6.9 05/26/2022   ALBUMIN  4.4  05/26/2022   CALCIUM 10.4 (H) 05/26/2022   ANIONGAP 8 07/16/2017   EGFR 94 05/26/2022   Lab Results  Component Value Date   CHOL 221 (H) 05/26/2022   Lab Results  Component Value Date   HDL 93 05/26/2022   Lab Results  Component Value Date   LDLCALC 119 (H) 05/26/2022   Lab Results  Component Value Date   TRIG 50 05/26/2022   Lab Results  Component Value Date   CHOLHDL 2.4 05/26/2022   Lab Results  Component Value Date   HGBA1C 6.0 (H) 01/23/2022      Assessment & Plan:  Enuresis, nocturnal only Assessment & Plan: She complains of enuresis at bedtime, which often disturbs her sleep She reports waking up around 2 to 3:00 in the morning with frequency of urination She reports having symptoms every night No medication taken We will start patient on a trial of Myrbetriq 25 mg daily for symptom relief  Orders: -     [facility] ER; Take 1 tablet (25 mg total) by mouth daily.  Dispense: 30 tablet; Refill: 1  Arthritis of right knee Assessment & Plan: S/p HTO  2007 She reports a sensation of pressure oin her right knee since gaining weight She reports that she is 40 pounds over her normal weight She reports decreased physical activities, however she intends to resume increasing her physical activities No effusion, redness, or tenderness to palpation noted Range of motion is intact Encourage conservative management and follow-up with orthopedics as needed   Primary hypertension Assessment & Plan: Controlled She takes amlodipine 5 mg daily She denies headaches, dizziness, blurred vision With a CBC and CMP today Encouraged to continue treatment regimen BP Readings from Last 3 Encounters:  11/25/22 130/77  10/26/22 117/77  09/15/22 130/86      Acute idiopathic gout of left ankle Assessment & Plan: Chronic condition Discussed starting gout prophylactics treatment Patient reports that she does not like taking daily medications and will likely be  noncompliant Reviewed low purine foods with the patient Patient verbalized understanding   IFG (impaired fasting glucose) -     Hemoglobin A1c  Vitamin D deficiency -     VITAMIN D 25 Hydroxy (Vit-D Deficiency, Fractures)  Other hyperlipidemia Assessment & Plan: She reports not taking atorvastatin 10 mg for about 2 months She reports taking kyolic cholesterol health supplement She denies muscle aches and pain Will assess cholesterol panel today  Orders: -     Lipid panel -     CMP14+EGFR -     CBC with Differential/Platelet  Other specified hypothyroidism Assessment & Plan: Stable without medication Will assess thyroid levels today  Orders: -     TSH + free T4    Follow-up: Return in about 3 months (around 02/24/2023).   Alvira Monday, FNP

## 2022-11-25 NOTE — Patient Instructions (Addendum)
I appreciate the opportunity to provide care to you today!    Follow up:  3 months  Labs: please stop by the lab today to get your blood drawn (CBC, CMP, TSH, Lipid profile, HgA1c, Vit D)    Please continue to a heart-healthy diet and increase your physical activities. Try to exercise for 13mns at least three times a week.      It was a pleasure to see you and I look forward to continuing to work together on your health and well-being. Please do not hesitate to call the office if you need care or have questions about your care.   Have a wonderful day and week. With Gratitude, GAlvira MondayMSN, FNP-BC

## 2022-11-25 NOTE — Assessment & Plan Note (Signed)
She complains of enuresis at bedtime, which often disturbs her sleep She reports waking up around 2 to 3:00 in the morning with frequency of urination She reports having symptoms every night No medication taken We will start patient on a trial of Myrbetriq 25 mg daily for symptom relief

## 2022-11-25 NOTE — Assessment & Plan Note (Signed)
Controlled She takes amlodipine 5 mg daily She denies headaches, dizziness, blurred vision With a CBC and CMP today Encouraged to continue treatment regimen BP Readings from Last 3 Encounters:  11/25/22 130/77  10/26/22 117/77  09/15/22 130/86

## 2022-11-26 ENCOUNTER — Other Ambulatory Visit: Payer: Self-pay | Admitting: Family Medicine

## 2022-11-26 DIAGNOSIS — E559 Vitamin D deficiency, unspecified: Secondary | ICD-10-CM

## 2022-11-26 LAB — CBC WITH DIFFERENTIAL/PLATELET
Basophils Absolute: 0 10*3/uL (ref 0.0–0.2)
Basos: 0 %
EOS (ABSOLUTE): 0.2 10*3/uL (ref 0.0–0.4)
Eos: 6 %
Hematocrit: 35.3 % (ref 34.0–46.6)
Hemoglobin: 12 g/dL (ref 11.1–15.9)
Immature Grans (Abs): 0 10*3/uL (ref 0.0–0.1)
Immature Granulocytes: 0 %
Lymphocytes Absolute: 1.3 10*3/uL (ref 0.7–3.1)
Lymphs: 35 %
MCH: 29.8 pg (ref 26.6–33.0)
MCHC: 34 g/dL (ref 31.5–35.7)
MCV: 88 fL (ref 79–97)
Monocytes Absolute: 0.5 10*3/uL (ref 0.1–0.9)
Monocytes: 12 %
Neutrophils Absolute: 1.8 10*3/uL (ref 1.4–7.0)
Neutrophils: 47 %
Platelets: 266 10*3/uL (ref 150–450)
RBC: 4.03 x10E6/uL (ref 3.77–5.28)
RDW: 11.9 % (ref 11.7–15.4)
WBC: 3.9 10*3/uL (ref 3.4–10.8)

## 2022-11-26 LAB — CMP14+EGFR
ALT: 14 IU/L (ref 0–32)
AST: 19 IU/L (ref 0–40)
Albumin/Globulin Ratio: 1.9 (ref 1.2–2.2)
Albumin: 4.4 g/dL (ref 3.8–4.8)
Alkaline Phosphatase: 106 IU/L (ref 44–121)
BUN/Creatinine Ratio: 13 (ref 12–28)
BUN: 9 mg/dL (ref 8–27)
Bilirubin Total: 0.3 mg/dL (ref 0.0–1.2)
CO2: 23 mmol/L (ref 20–29)
Calcium: 10.7 mg/dL — ABNORMAL HIGH (ref 8.7–10.3)
Chloride: 102 mmol/L (ref 96–106)
Creatinine, Ser: 0.69 mg/dL (ref 0.57–1.00)
Globulin, Total: 2.3 g/dL (ref 1.5–4.5)
Glucose: 93 mg/dL (ref 70–99)
Potassium: 4.1 mmol/L (ref 3.5–5.2)
Sodium: 140 mmol/L (ref 134–144)
Total Protein: 6.7 g/dL (ref 6.0–8.5)
eGFR: 92 mL/min/{1.73_m2} (ref >59)

## 2022-11-26 LAB — LIPID PANEL
Chol/HDL Ratio: 2.6 ratio (ref 0.0–4.4)
Cholesterol, Total: 262 mg/dL — ABNORMAL HIGH (ref 100–199)
HDL: 101 mg/dL (ref >39)
LDL Chol Calc (NIH): 151 mg/dL — ABNORMAL HIGH (ref 0–99)
Triglycerides: 62 mg/dL (ref 0–149)
VLDL Cholesterol Cal: 10 mg/dL (ref 5–40)

## 2022-11-26 LAB — HEMOGLOBIN A1C
Est. average glucose Bld gHb Est-mCnc: 134 mg/dL
Hgb A1c MFr Bld: 6.3 % — ABNORMAL HIGH (ref 4.8–5.6)

## 2022-11-26 LAB — TSH+FREE T4
Free T4: 1.13 ng/dL (ref 0.82–1.77)
TSH: 1.36 u[IU]/mL (ref 0.450–4.500)

## 2022-11-26 LAB — VITAMIN D 25 HYDROXY (VIT D DEFICIENCY, FRACTURES): Vit D, 25-Hydroxy: 24.5 ng/mL — ABNORMAL LOW (ref 30.0–100.0)

## 2022-11-26 MED ORDER — VITAMIN D (ERGOCALCIFEROL) 1.25 MG (50000 UNIT) PO CAPS: 50000.0000 [IU] | ORAL_CAPSULE | ORAL | 2 refills | Status: DC

## 2022-11-26 NOTE — Progress Notes (Signed)
Please inform the patient that her cholesterol levels are elevated. I recommend that she resume therapy with atorvastatin 10 mg daily with a low-carb, trans, and saturated-fat diet. Please inform the patient that her hemoglobin A1c has increased from 6.0 to 6.3. She is prediabetic; I recommend decreasing her intake of high-sugar foods with increased physical activity. Her vitamin D is low; I have sent a prescription for a weekly vitamin D supplement to start taking.

## 2022-12-01 ENCOUNTER — Telehealth: Payer: Self-pay | Admitting: Family Medicine

## 2022-12-01 ENCOUNTER — Other Ambulatory Visit: Payer: Self-pay | Admitting: Family Medicine

## 2022-12-01 DIAGNOSIS — E785 Hyperlipidemia, unspecified: Secondary | ICD-10-CM

## 2022-12-01 MED ORDER — EZETIMIBE 10 MG PO TABS: 10.0000 mg | ORAL_TABLET | Freq: Every day | ORAL | 3 refills | Status: DC

## 2022-12-01 NOTE — Telephone Encounter (Signed)
Please inform the patient that a prescription for azithromycin milligrams have been sent to her pharmacy, to take for her cholesterol.  She can discontinue atorvastatin 10 mg.  I recommend taking over-the-counter fish oil 2000 mg twice daily.

## 2022-12-01 NOTE — Telephone Encounter (Signed)
Spoke to patient states she was off the medication (atorvastatin) before labs, started medication after the labs resulted and every time she takes atorvastatin she will start with a horrible headache on the left side, last a few days, says she stopped the medication,  wants to know if an alternative can be sent in?

## 2022-12-01 NOTE — Telephone Encounter (Signed)
Attempted to call pt unable to leave message.

## 2022-12-01 NOTE — Telephone Encounter (Signed)
Pt called stating that she has been advised to stop taking cholesterol medication?? States she is having headaches taking this medication?? Can nurse can please call her??

## 2022-12-02 ENCOUNTER — Other Ambulatory Visit: Payer: Self-pay

## 2022-12-02 DIAGNOSIS — E785 Hyperlipidemia, unspecified: Secondary | ICD-10-CM

## 2022-12-02 MED ORDER — EZETIMIBE 10 MG PO TABS: 10.0000 mg | ORAL_TABLET | Freq: Every day | ORAL | 3 refills | Status: AC

## 2022-12-02 NOTE — Telephone Encounter (Signed)
Pt has tried fish oil before in gel capsule didn't do too well with this, will give it another try, say she has a supplement called "cod liver oil 1-'100mg'$  + omega 3 + vitamin A + Vitamin D3" if the fish oil doesn't work out for her is this okay for her to do?

## 2022-12-02 NOTE — Telephone Encounter (Signed)
sure

## 2022-12-02 NOTE — Telephone Encounter (Signed)
She is asking if an rx for the fish oil can be sent in so she doesn't have to pay out of pocket, say [facility] might cover this for her.

## 2022-12-04 ENCOUNTER — Other Ambulatory Visit: Payer: Self-pay | Admitting: Family Medicine

## 2022-12-04 DIAGNOSIS — E785 Hyperlipidemia, unspecified: Secondary | ICD-10-CM

## 2022-12-04 MED ORDER — OMEGA-3-ACID ETHYL ESTERS 1 G PO CAPS: 1.0000 g | ORAL_CAPSULE | Freq: Two times a day (BID) | ORAL | 1 refills | Status: DC

## 2022-12-04 NOTE — Telephone Encounter (Signed)
Rx sent 

## 2022-12-18 ENCOUNTER — Encounter: Payer: Self-pay | Admitting: Family Medicine

## 2022-12-18 ENCOUNTER — Ambulatory Visit (INDEPENDENT_AMBULATORY_CARE_PROVIDER_SITE_OTHER): Payer: Medicare HMO | Admitting: Family Medicine

## 2022-12-18 VITALS — BP 136/80 | HR 79 | Ht 64.0 in | Wt 174.0 lb

## 2022-12-18 DIAGNOSIS — M79674 Pain in right toe(s): Secondary | ICD-10-CM

## 2022-12-18 DIAGNOSIS — S91111A Laceration without foreign body of right great toe without damage to nail, initial encounter: Secondary | ICD-10-CM

## 2022-12-18 DIAGNOSIS — I10 Essential (primary) hypertension: Secondary | ICD-10-CM | POA: Diagnosis not present

## 2022-12-18 DIAGNOSIS — M069 Rheumatoid arthritis, unspecified: Secondary | ICD-10-CM | POA: Diagnosis not present

## 2022-12-18 NOTE — Progress Notes (Signed)
   Megan [name]     MRN: [id number]      DOB: 1951/01/18   HPI Megan [name] is here with pain andswelling of right foot at basre of right great toe, which she states is due to gout. Hasused OTC medication  and supplements with approx 70 % improvement. Of note uric acid level is normal and she is on no  chronic medication C/o cut on right great toe x 1 day, accidentally nipped skin. /o fact that " all food "is unhealthy and bad for her , as she has gout, hyperlipidemia and is prediabetic, however trying hard to eat and make food choices for better health   ROS Denies recent fever or chills. Denies sinus pressure, nasal congestion, ear pain or sore throat. Denies chest congestion, productive cough or wheezing. Denies chest pains, palpitations and leg swelling Denies abdominal pain, nausea, vomiting,diarrhea or constipation.   Denies dysuria, frequency, hesitancy or incontinence. Denies headaches, seizures, numbness, or tingling.  BP 136/80 (BP Location: Right Arm, Patient Position: Sitting, Cuff Size: Large)   Pulse 79   Ht '5\' 4"'$  (1.626 m)   Wt 174 lb (78.9 kg)   SpO2 95%   BMI 29.87 kg/m   Patient alert and oriented and in no cardiopulmonary distress.  HEENT: No facial asymmetry, EOMI,     Neck supple .  Chest: Clear to auscultation bilaterally.  CVS: S1, S2 no murmurs, no S3.Regular rate.    MS: Adequate ROM spine, shoulders, hips and knees.No redness , warmth , tenderness or swelling noted on dorsum of right foot or at DIP joint of great toe  Skin: minor laceration limited to skin, length approx 1.25 cm, clean nor purulent drainage Psych: Good eye contact, normal affect. Memory intact not anxious or depressed appearing.  CNS: CN 2-12 intact, power,  normal throughout.no focal deficits noted.   Assessment & Plan  Great toe pain, right Reported as gout by pt, exam in office NORMAL, no medication prescribed, conitnue OTC pain management that is working, note that uric acid  level normal  Laceration of right great toe Minor laceration, cleaned with N/S and topical antibiotic applied  Hypertension Controlled, no change in medication, f/u with PCP

## 2022-12-18 NOTE — Patient Instructions (Addendum)
F/U with PCP as before, call if you need to be seen sooner  Nurse wil clean and apply topical antibiotic and dressing to the cut on your right great toe, continue to keep it clean and dry.  No medication  for gout flare as you are 70 % better   Thanks for choosing [facility], we consider it a privelige to serve you.

## 2022-12-22 ENCOUNTER — Encounter: Payer: Self-pay | Admitting: Family Medicine

## 2022-12-22 DIAGNOSIS — S91111A Laceration without foreign body of right great toe without damage to nail, initial encounter: Secondary | ICD-10-CM | POA: Insufficient documentation

## 2022-12-22 DIAGNOSIS — M79674 Pain in right toe(s): Secondary | ICD-10-CM | POA: Insufficient documentation

## 2022-12-22 NOTE — Assessment & Plan Note (Signed)
Reported as gout by pt, exam in office NORMAL, no medication prescribed, conitnue OTC pain management that is working, note that uric acid level normal

## 2022-12-22 NOTE — Assessment & Plan Note (Signed)
Controlled, no change in medication, f/u with PCP

## 2022-12-22 NOTE — Assessment & Plan Note (Signed)
Minor laceration, cleaned with N/S and topical antibiotic applied

## 2022-12-28 ENCOUNTER — Telehealth: Payer: Self-pay | Admitting: Family Medicine

## 2022-12-28 NOTE — Telephone Encounter (Signed)
Spoke to pt, she has questions about her medication therapy I advised for her to check with pharmacy as far as side effects, informed her Peter Congo is out of the office and will be back on Thursday.

## 2022-12-28 NOTE — Telephone Encounter (Signed)
Pt wants to speak to nurse in regards to medication

## 2023-02-19 ENCOUNTER — Telehealth: Payer: Self-pay | Admitting: Family Medicine

## 2023-02-19 ENCOUNTER — Other Ambulatory Visit: Payer: Self-pay

## 2023-02-19 DIAGNOSIS — I10 Essential (primary) hypertension: Secondary | ICD-10-CM

## 2023-02-19 MED ORDER — AMLODIPINE BESYLATE 5 MG PO TABS: 5.0000 mg | ORAL_TABLET | Freq: Every day | ORAL | 1 refills | Status: DC

## 2023-02-19 NOTE — Telephone Encounter (Signed)
Prescription Request  02/19/2023  LOV: 11/25/2022  What is the name of the medication or equipment? amLODipine (NORVASC) 5 MG tablet   Have you contacted your pharmacy to request a refill? YES   Which pharmacy would you like this sent to?  [place], [place] - [id number] [place] [address] Phone: [phone] Fax: [phone]  [facility]) - [place], [facility] [address] [place] [address] [place] [address] Phone: [phone] Fax: [phone]    Patient notified that their request is being sent to the clinical staff for review and that they should receive a response within 2 business days.   Please advise at [place]

## 2023-02-19 NOTE — Telephone Encounter (Signed)
Rx sent 

## 2023-02-25 ENCOUNTER — Encounter: Payer: Self-pay | Admitting: Family Medicine

## 2023-02-25 ENCOUNTER — Ambulatory Visit (INDEPENDENT_AMBULATORY_CARE_PROVIDER_SITE_OTHER): Payer: Medicare HMO | Admitting: Family Medicine

## 2023-02-25 VITALS — Ht 64.0 in | Wt 174.0 lb

## 2023-02-25 VITALS — BP 137/87 | HR 76 | Ht 64.0 in | Wt 174.0 lb

## 2023-02-25 DIAGNOSIS — E538 Deficiency of other specified B group vitamins: Secondary | ICD-10-CM | POA: Diagnosis not present

## 2023-02-25 DIAGNOSIS — R7301 Impaired fasting glucose: Secondary | ICD-10-CM | POA: Diagnosis not present

## 2023-02-25 DIAGNOSIS — I10 Essential (primary) hypertension: Secondary | ICD-10-CM

## 2023-02-25 DIAGNOSIS — E7849 Other hyperlipidemia: Secondary | ICD-10-CM | POA: Diagnosis not present

## 2023-02-25 DIAGNOSIS — E038 Other specified hypothyroidism: Secondary | ICD-10-CM | POA: Diagnosis not present

## 2023-02-25 DIAGNOSIS — H9202 Otalgia, left ear: Secondary | ICD-10-CM

## 2023-02-25 DIAGNOSIS — M5136 Other intervertebral disc degeneration, lumbar region: Secondary | ICD-10-CM | POA: Diagnosis not present

## 2023-02-25 DIAGNOSIS — Z0000 Encounter for general adult medical examination without abnormal findings: Secondary | ICD-10-CM | POA: Diagnosis not present

## 2023-02-25 DIAGNOSIS — E559 Vitamin D deficiency, unspecified: Secondary | ICD-10-CM | POA: Diagnosis not present

## 2023-02-25 MED ORDER — VITAMIN B-12 1000 MCG PO TABS: 1000.0000 ug | ORAL_TABLET | Freq: Every day | ORAL | 1 refills | Status: AC

## 2023-02-25 NOTE — Patient Instructions (Addendum)
  I appreciate the opportunity to provide care to you today!    Follow up:  3 months  Labs: please stop by the lab today to get your blood drawn (CBC, CMP, TSH, Lipid profile, HgA1c, Vit D and Vit B12)   -Please pick up your  Vit B12 supplement prescription at the pharmacy   Please continue to a heart-healthy diet and increase your physical activities. Try to exercise for 55mins at least five days a week.      It was a pleasure to see you and I look forward to continuing to work together on your health and well-being. Please do not hesitate to call the office if you need care or have questions about your care.   Have a wonderful day and week. With Gratitude, Alvira Monday MSN, FNP-BC

## 2023-02-25 NOTE — Progress Notes (Signed)
Subjective:   Megan [name] is a 72 y.o. female who presents for Medicare Annual (Subsequent) preventive examination.  Review of Systems    Patient denies pain, fever, chills, chest pain, palpations ,shortness of breath, blurred vision,cough, abdominal pain, nausea, vomiting, headache, dizziness. Patient is not feeling nervous or anxious.   Cardiac Risk Factors include: advanced age (>77men, >29 women)     Objective:    Today's Vitals   02/25/23 1109  Weight: 174 lb (78.9 kg)  Height: 5\' 4"  (1.626 m)  PainSc: 0-No pain   Body mass index is 29.87 kg/m.     02/25/2023   11:14 AM 09/03/2022    3:22 PM 02/02/2022    9:45 AM 01/27/2021    2:52 PM 09/11/2019   11:01 AM 11/12/2018    9:07 AM 08/04/2016    9:31 AM  Advanced Directives  Does Patient Have a Medical Advance Directive? No Yes Yes Yes No;Yes Yes No  Type of Advance Directive  Living will;Healthcare Power of Attorney Living will;Healthcare Power of [place];Living will   Does patient want to make changes to medical advance directive?  No - Patient declined No - Patient declined No - Patient declined No - Patient declined    Copy of [place] in Chart?   No - copy requested  No - copy requested    Would patient like information on creating a medical advance directive? Yes (Inpatient - patient defers creating a medical advance directive at this time - Information given)  No - Patient declined  Yes (MAU/Ambulatory/Procedural Areas - Information given)  No - patient declined information    Current Medications (verified) Outpatient Encounter Medications as of 02/25/2023  Medication Sig   amLODipine (NORVASC) 5 MG tablet Take 1 tablet (5 mg total) by mouth daily.   CINNAMON PO Take by mouth. As needed   cyanocobalamin (VITAMIN B12) 1000 MCG tablet Take 1 tablet (1,000 mcg total) by mouth daily.   ezetimibe (ZETIA) 10 MG tablet Take 1 tablet (10 mg total)  by mouth daily.   GARLIC-LECITHIN PO Take by mouth.   Ginger, Zingiber officinalis, (GINGER PO) Take by mouth. As needed   Magnesium 200 MG TABS Take by mouth.   mirabegron ER (MYRBETRIQ) 25 MG TB24 tablet Take 1 tablet (25 mg total) by mouth daily.   Misc Natural Products (GINKOGIN PO) Take by mouth.   Multiple Vitamins-Minerals (ONE-A-DAY WOMENS PO) Take by mouth. As needed   omega-3 acid ethyl esters (LOVAZA) 1 g capsule Take 1 capsule (1 g total) by mouth 2 (two) times daily.   UNABLE TO FIND Med Name: Celery seed tab 1,000mg . Takes once weekly.   Vitamin D, Ergocalciferol, (DRISDOL) 1.25 MG (50000 UNIT) CAPS capsule Take 1 capsule (50,000 Units total) by mouth every 7 (seven) days.   No facility-administered encounter medications on file as of 02/25/2023.    Allergies (verified) Other, Hydrocodone, and Naproxen   History: Past Medical History:  Diagnosis Date   Gout    High cholesterol    Hypertension    Kidney stone    Leukopenia 05/04/2016   Rheumatoid arthritis ([place])    Seasonal allergies    Thyroid disease    Past Surgical History:  Procedure Laterality Date   BILATERAL KNEE ARTHROSCOPY     COLONOSCOPY N/A 07/30/2014   Procedure: COLONOSCOPY;  Surgeon: Danie Binder, MD;  Location: [facility];  Service: Endoscopy;  Laterality: N/A;  9:30 AM   high cholesterol     Right knee osteotomy     Family History  Problem Relation Age of Onset   Hypertension Mother    Asthma Mother    Other Father        Rare blood disease   Hypertension Brother    Colon cancer Paternal Uncle    Bladder Cancer Neg Hx    Kidney cancer Neg Hx    Social History   Socioeconomic History   Marital status: Married    Spouse name: Not on file   Number of children: 4   Years of education: Not on file   Highest education level: Not on file  Occupational History   Occupation: Retired; Works PT in [employer] at [employer] in [place] Use   Smoking status: Never   Smokeless  tobacco: Never  Vaping Use   Vaping Use: Never used  Substance and Sexual Activity   Alcohol use: No   Drug use: No    Comment: used CBD oil   Sexual activity: Yes  Other Topics Concern   Not on file  Social History Narrative   3 biological children and 1 adopted   Social Determinants of Health   Financial Resource Strain: Low Risk  (02/25/2023)   Overall Financial Resource Strain (CARDIA)    Difficulty of Paying Living Expenses: Not hard at all  Food Insecurity: No Food Insecurity (02/25/2023)   Hunger Vital Sign    Worried About Running Out of Food in the Last Year: Never true    Ran Out of Food in the Last Year: Never true  Transportation Needs: No Transportation Needs (02/25/2023)   PRAPARE - Hydrologist (Medical): No    Lack of Transportation (Non-Medical): No  Physical Activity: Sufficiently Active (02/25/2023)   Exercise Vital Sign    Days of Exercise per Week: 3 days    Minutes of Exercise per Session: 60 min  Stress: No Stress Concern Present (02/25/2023)   [place]    Feeling of Stress : Not at all  Social Connections: Moderately Isolated (02/25/2023)   Social Connection and Isolation Panel [NHANES]    Frequency of Communication with Friends and Family: More than three times a week    Frequency of Social Gatherings with Friends and Family: Once a week    Attends Religious Services: Never    Marine scientist or Organizations: No    Attends Music therapist: Never    Marital Status: Married    Tobacco Counseling Counseling given: Not Answered   Clinical Intake:  Pre-visit preparation completed: No  Pain : No/denies pain Pain Score: 0-No pain     BMI - recorded: 29.87 Nutritional Status: BMI 25 -29 Overweight Diabetes: No  How often do you need to have someone help you when you read instructions, pamphlets, or other written materials from  your doctor or pharmacy?: 1 - Never  Diabetic? No Hemoglobin A1c 6.3  Interpreter Needed?: No      Activities of Daily Living    02/25/2023   11:15 AM  In your present state of health, do you have any difficulty performing the following activities:  Hearing? 0  Vision? 0  Difficulty concentrating or making decisions? 0  Walking or climbing stairs? 0  Dressing or bathing? 0  Doing errands, shopping? 0  Preparing Food and eating ? N  Using the Toilet? N  In  the past six months, have you accidently leaked urine? N  Do you have problems with loss of bowel control? N  Managing your Medications? N  Managing your Finances? N  Housekeeping or managing your Housekeeping? N    Patient Care Team: Alvira Monday, FNP as PCP - General (Family Medicine)  Indicate any recent Medical Services you may have received from other than [facility] providers in the past year (date may be approximate).     Assessment:   This is a routine wellness examination for Lu[place].  Hearing/Vision screen No results found.  Dietary issues and exercise activities discussed:   Discussed lifestyle modifications follow diet low in saturated fat, reduce dietary salt intake, avoid fatty foods, maintain an exercise routine 3 to 5 days a week for a minimum total of 150 minutes.   Goals Addressed   None    Depression Screen    02/25/2023   10:12 AM 12/18/2022   11:18 AM 11/25/2022    9:57 AM 10/26/2022   10:22 AM 09/15/2022    8:09 AM 07/30/2022   10:40 AM 05/26/2022   10:00 AM  PHQ 2/9 Scores  PHQ - 2 Score 0 0 0 0 0 0 0  PHQ- 9 Score 0 0 0        Fall Risk    02/25/2023   11:15 AM 02/25/2023   10:12 AM 12/18/2022   11:17 AM 11/25/2022    9:57 AM 10/26/2022   10:22 AM  Fall Risk   Falls in the past year? 0 0 0 0 0  Number falls in past yr: 0 0 0 0 0  Injury with Fall? 0 0 0 0 0  Risk for fall due to : No Fall Risks No Fall Risks No Fall Risks No Fall Risks No Fall Risks  Follow up Falls evaluation  completed Falls evaluation completed Falls evaluation completed Falls evaluation completed Falls evaluation completed    FALL RISK PREVENTION PERTAINING TO THE HOME:  Any stairs in or around the home? No  If so, are there any without handrails? No  Home free of loose throw rugs in walkways, pet beds, electrical cords, etc? Yes  Adequate lighting in your home to reduce risk of falls? Yes   ASSISTIVE DEVICES UTILIZED TO PREVENT FALLS:  Life alert? No  Use of a cane, walker or w/c? No  Grab bars in the bathroom? Yes  Shower chair or bench in shower? Yes  Elevated toilet seat or a handicapped toilet? Yes    Cognitive Function:        02/25/2023   11:15 AM 02/02/2022    9:41 AM  6CIT Screen  What Year? 0 points 0 points  What month? 0 points 0 points  What time? 0 points 0 points  Count back from 20 0 points 0 points  Months in reverse 0 points 0 points  Repeat phrase 0 points 0 points  Total Score 0 points 0 points    Immunizations Immunization History  Administered Date(s) Administered   Moderna SARS-COV2 Booster Vaccination 11/21/2020   Moderna Sars-Covid-2 Vaccination 03/14/2020, 04/16/2020    TDAP status: Up to date  Flu Vaccine status: Up to date  Pneumococcal vaccine status: Up to date  Covid-19 vaccine status: Information provided on how to obtain vaccines.   Qualifies for Shingles Vaccine? Yes   Zostavax completed Yes   Shingrix Completed?: Yes  Screening Tests Health Maintenance  Topic Date Due   Medicare Annual Wellness (AWV)  02/03/2023   Zoster  Vaccines- Shingrix (1 of 2) 05/28/2023 (Originally 07/26/1970)   Pneumonia Vaccine 94+ Years old (1 of 1 - PCV) 10/27/2023 (Originally 07/27/2015)   INFLUENZA VACCINE  11/30/2028 (Originally 06/30/2022)   MAMMOGRAM  04/06/2024   COLONOSCOPY (Pts 45-30yrs Insurance coverage will need to be confirmed)  07/30/2024   DEXA SCAN  Completed   Hepatitis C Screening  Completed   HPV VACCINES  Aged Out   DTaP/Tdap/Td   Discontinued   COVID-19 Vaccine  Discontinued    Health Maintenance  Health Maintenance Due  Topic Date Due   Medicare Annual Wellness (AWV)  02/03/2023    Colorectal cancer screening: Type of screening: Colonoscopy. Completed Due on 07/30/2024. Repeat every   years  Mammogram status: Completed 1. Repeat every year  Bone Density status: Completed 01/2021. Results reflect: Bone density results: OSTEOPOROSIS. Repeat every   years.  Lung Cancer Screening: (Low Dose CT Chest recommended if Age 67-80 years, 30 pack-year currently smoking OR have quit w/in 15years.) does not qualify.   Lung Cancer Screening Referral: N/A  Additional Screening:  Hepatitis C Screening: does not qualify; Completed 2020 Negative  Vision Screening: Recommended annual ophthalmology exams for early detection of glaucoma and other disorders of the eye. Is the patient up to date with their annual eye exam?  Yes  Who is the provider or what is the name of the office in which the patient attends annual eye exams? [facility] If pt is not established with a provider, would they like to be referred to a provider to establish care? No .   Dental Screening: Recommended annual dental exams for proper oral hygiene  Community Resource Referral / Chronic Care Management: CRR required this visit?  No   CCM required this visit?  No      Plan:     I have personally reviewed and noted the following in the patient's chart:   Medical and social history Use of alcohol, tobacco or illicit drugs  Current medications and supplements including opioid prescriptions. Patient is not currently taking opioid prescriptions. Functional ability and status Nutritional status Physical activity Advanced directives List of other physicians Hospitalizations, surgeries, and ER visits in previous 12 months Vitals Screenings to include cognitive, depression, and falls Referrals and appointments  In addition, I have  reviewed and discussed with patient certain preventive protocols, quality metrics, and best practice recommendations. A written personalized care plan for preventive services as well as general preventive health recommendations were provided to patient.     [place],    02/25/2023

## 2023-02-25 NOTE — Progress Notes (Signed)
follo

## 2023-02-25 NOTE — Progress Notes (Signed)
Established Patient Office Visit  Subjective:  Patient ID: Megan [name], female    DOB: 02-16-1951  Age: 72 y.o. MRN: [id number]  CC:  Chief Complaint  Patient presents with   Follow-up    3 month f/u. Pt reports right knee discomfort has a spot that is numb on her knee since 8 weeks ago (12/31/2022), also has left ear shallow pain since (12/31/2022)    HPI Megan [name] is a 72 y.o. female with past medical history of hypertension,  and hyperlipidemia, presents for f/u of  chronic medical conditions.  Past Medical History:  Diagnosis Date   Gout    High cholesterol    Hypertension    Kidney stone    Leukopenia 05/04/2016   Rheumatoid arthritis ([place])    Seasonal allergies    Thyroid disease     Past Surgical History:  Procedure Laterality Date   BILATERAL KNEE ARTHROSCOPY     COLONOSCOPY N/A 07/30/2014   Procedure: COLONOSCOPY;  Surgeon: Danie Binder, MD;  [facility];  Service: Endoscopy;  Laterality: N/A;  9:30 AM   high cholesterol     Right knee osteotomy      Family History  Problem Relation Age of Onset   Hypertension Mother    Asthma Mother    Other Father        Rare blood disease   Hypertension Brother    Colon cancer Paternal Uncle    Bladder Cancer Neg Hx    Kidney cancer Neg Hx     Social History   Socioeconomic History   Marital status: Married    Spouse name: Not on file   Number of children: 4   Years of education: Not on file   Highest education level: Not on file  Occupational History   Occupation: Retired; Works PT in [employer] at [employer] in [place] Use   Smoking status: Never   Smokeless tobacco: Never  Vaping Use   Vaping Use: Never used  Substance and Sexual Activity   Alcohol use: No   Drug use: No    Comment: used CBD oil   Sexual activity: Yes  Other Topics Concern   Not on file  Social History Narrative   3 biological children and 1 adopted   Social Determinants of Health   Financial  Resource Strain: Low Risk  (02/25/2023)   Overall Financial Resource Strain (CARDIA)    Difficulty of Paying Living Expenses: Not hard at all  Food Insecurity: No Food Insecurity (02/25/2023)   Hunger Vital Sign    Worried About Running Out of Food in the Last Year: Never true    Ran Out of Food in the Last Year: Never true  Transportation Needs: No Transportation Needs (02/25/2023)   PRAPARE - Hydrologist (Medical): No    Lack of Transportation (Non-Medical): No  Physical Activity: Sufficiently Active (02/25/2023)   Exercise Vital Sign    Days of Exercise per Week: 3 days    Minutes of Exercise per Session: 60 min  Stress: No Stress Concern Present (02/25/2023)   [place]    Feeling of Stress : Not at all  Social Connections: Moderately Isolated (02/25/2023)   Social Connection and Isolation Panel [NHANES]    Frequency of Communication with Friends and Family: More than three times a week    Frequency of Social Gatherings with Friends and Family: Once a week  Attends Religious Services: Never    Active Member of Clubs or Organizations: No    Attends Archivist Meetings: Never    Marital Status: Married  Human resources officer Violence: Not At Risk (02/25/2023)   Humiliation, Afraid, Rape, and Kick questionnaire    Fear of Current or Ex-Partner: No    Emotionally Abused: No    Physically Abused: No    Sexually Abused: No    Outpatient Medications Prior to Visit  Medication Sig Dispense Refill   amLODipine (NORVASC) 5 MG tablet Take 1 tablet (5 mg total) by mouth daily. 90 tablet 1   CINNAMON PO Take by mouth. As needed     ezetimibe (ZETIA) 10 MG tablet Take 1 tablet (10 mg total) by mouth daily. 90 tablet 3   GARLIC-LECITHIN PO Take by mouth.     Ginger, Zingiber officinalis, (GINGER PO) Take by mouth. As needed     Magnesium 200 MG TABS Take by mouth.     mirabegron ER  (MYRBETRIQ) 25 MG TB24 tablet Take 1 tablet (25 mg total) by mouth daily. 30 tablet 1   Misc Natural Products (GINKOGIN PO) Take by mouth.     Multiple Vitamins-Minerals (ONE-A-DAY WOMENS PO) Take by mouth. As needed     omega-3 acid ethyl esters (LOVAZA) 1 g capsule Take 1 capsule (1 g total) by mouth 2 (two) times daily. 90 capsule 1   UNABLE TO FIND Med Name: Celery seed tab 1,000mg . Takes once weekly.     Vitamin D, Ergocalciferol, (DRISDOL) 1.25 MG (50000 UNIT) CAPS capsule Take 1 capsule (50,000 Units total) by mouth every 7 (seven) days. 10 capsule 2   No facility-administered medications prior to visit.    Allergies  Allergen Reactions   Other Other (See Comments)    Nuts - pt states sometimes makes her gout flare up   Hydrocodone     Makes her feel bad   Naproxen     Makes her feel bad     ROS Review of Systems  Constitutional:  Negative for chills and fever.  Eyes:  Negative for visual disturbance.  Respiratory:  Negative for chest tightness and shortness of breath.   Neurological:  Negative for dizziness and headaches.      Objective:    Physical Exam HENT:     Head: Normocephalic.     Comments: Indent noted behind the left ear    Mouth/Throat:     Mouth: Mucous membranes are moist.  Cardiovascular:     Rate and Rhythm: Normal rate.     Heart sounds: Normal heart sounds.  Pulmonary:     Effort: Pulmonary effort is normal.     Breath sounds: Normal breath sounds.  Musculoskeletal:     Right knee: No swelling, deformity, effusion, erythema, ecchymosis or lacerations. Normal range of motion. No tenderness.  Neurological:     Mental Status: She is alert.     BP 137/87   Pulse 76   Ht 5\' 4"  (1.626 m)   Wt 174 lb 0.6 oz (78.9 kg)   SpO2 96%   BMI 29.87 kg/m  Wt Readings from Last 3 Encounters:  02/25/23 174 lb (78.9 kg)  02/25/23 174 lb 0.6 oz (78.9 kg)  12/18/22 174 lb (78.9 kg)    Lab Results  Component Value Date   TSH 1.630 02/25/2023   Lab  Results  Component Value Date   WBC 3.5 02/25/2023   HGB 12.9 02/25/2023   HCT 38.1 02/25/2023  MCV 86 02/25/2023   PLT 233 02/25/2023   Lab Results  Component Value Date   NA 141 02/25/2023   K 4.3 02/25/2023   CO2 25 02/25/2023   GLUCOSE 88 02/25/2023   BUN 10 02/25/2023   CREATININE 0.77 02/25/2023   BILITOT 0.3 02/25/2023   ALKPHOS 106 02/25/2023   AST 20 02/25/2023   ALT 17 02/25/2023   PROT 7.1 02/25/2023   ALBUMIN 4.4 02/25/2023   CALCIUM 10.4 (H) 02/25/2023   ANIONGAP 8 07/16/2017   EGFR 82 02/25/2023   Lab Results  Component Value Date   CHOL 259 (H) 02/25/2023   Lab Results  Component Value Date   HDL 90 02/25/2023   Lab Results  Component Value Date   LDLCALC 156 (H) 02/25/2023   Lab Results  Component Value Date   TRIG 79 02/25/2023   Lab Results  Component Value Date   CHOLHDL 2.9 02/25/2023   Lab Results  Component Value Date   HGBA1C 6.0 (H) 02/25/2023      Assessment & Plan:  Pain behind the ear, left Assessment & Plan: Complains of pain behind the left ear when wearing her glasses Pain is relieved after the removal of her glasses Of note, the patient wears glasses On physical examination, an indentation is seen behind the left ear Encourage using eyeglasses ear cushions for the left ear   DDD (degenerative disc disease), lumbar Assessment & Plan: Complains of sciatica pain from her right buttocks to her right knee She notes recently having numbness without tingling or pins and needle sensation No deformity, swelling, and evidence of inflammation Of note, the patient has a history of arthritis No limitation in her gait Will assess her B12 levels today Encouraged to take over-the-counter analgesic as needed   B12 deficiency -     Vitamin B12 -     Vitamin B-12; Take 1 tablet (1,000 mcg total) by mouth daily.  Dispense: 90 tablet; Refill: 1  IFG (impaired fasting glucose) -     Hemoglobin A1c  Vitamin D deficiency -      VITAMIN D 25 Hydroxy (Vit-D Deficiency, Fractures)  Other specified hypothyroidism -     TSH + free T4  Other hyperlipidemia -     Lipid panel  Primary hypertension -     CMP14+EGFR -     CBC with Differential/Platelet  Other orders -     Vitamin B12    Follow-up: Return in about 3 months (around 05/28/2023).   Alvira Monday, FNP

## 2023-02-26 DIAGNOSIS — H9202 Otalgia, left ear: Secondary | ICD-10-CM | POA: Insufficient documentation

## 2023-02-26 NOTE — Assessment & Plan Note (Signed)
Complains of sciatica pain from her right buttocks to her right knee She notes recently having numbness without tingling or pins and needle sensation No deformity, swelling, and evidence of inflammation Of note, the patient has a history of arthritis No limitation in her gait Will assess her B12 levels today Encouraged to take over-the-counter analgesic as needed

## 2023-02-26 NOTE — Assessment & Plan Note (Signed)
Complains of pain behind the left ear when wearing her glasses Pain is relieved after the removal of her glasses Of note, the patient wears glasses On physical examination, an indentation is seen behind the left ear Encourage using eyeglasses ear cushions for the left ear

## 2023-02-27 LAB — CMP14+EGFR
ALT: 17 IU/L (ref 0–32)
AST: 20 IU/L (ref 0–40)
Albumin/Globulin Ratio: 1.6 (ref 1.2–2.2)
Albumin: 4.4 g/dL (ref 3.8–4.8)
Alkaline Phosphatase: 106 IU/L (ref 44–121)
BUN/Creatinine Ratio: 13 (ref 12–28)
BUN: 10 mg/dL (ref 8–27)
Bilirubin Total: 0.3 mg/dL (ref 0.0–1.2)
CO2: 25 mmol/L (ref 20–29)
Calcium: 10.4 mg/dL — ABNORMAL HIGH (ref 8.7–10.3)
Chloride: 102 mmol/L (ref 96–106)
Creatinine, Ser: 0.77 mg/dL (ref 0.57–1.00)
Globulin, Total: 2.7 g/dL (ref 1.5–4.5)
Glucose: 88 mg/dL (ref 70–99)
Potassium: 4.3 mmol/L (ref 3.5–5.2)
Sodium: 141 mmol/L (ref 134–144)
Total Protein: 7.1 g/dL (ref 6.0–8.5)
eGFR: 82 mL/min/{1.73_m2} (ref >59)

## 2023-02-27 LAB — CBC WITH DIFFERENTIAL/PLATELET
Basophils Absolute: 0 10*3/uL (ref 0.0–0.2)
Basos: 0 %
EOS (ABSOLUTE): 0.3 10*3/uL (ref 0.0–0.4)
Eos: 7 %
Hematocrit: 38.1 % (ref 34.0–46.6)
Hemoglobin: 12.9 g/dL (ref 11.1–15.9)
Immature Grans (Abs): 0 10*3/uL (ref 0.0–0.1)
Immature Granulocytes: 0 %
Lymphocytes Absolute: 1.3 10*3/uL (ref 0.7–3.1)
Lymphs: 38 %
MCH: 29.1 pg (ref 26.6–33.0)
MCHC: 33.9 g/dL (ref 31.5–35.7)
MCV: 86 fL (ref 79–97)
Monocytes Absolute: 0.3 10*3/uL (ref 0.1–0.9)
Monocytes: 9 %
Neutrophils Absolute: 1.6 10*3/uL (ref 1.4–7.0)
Neutrophils: 46 %
Platelets: 233 10*3/uL (ref 150–450)
RBC: 4.44 x10E6/uL (ref 3.77–5.28)
RDW: 12.3 % (ref 11.7–15.4)
WBC: 3.5 10*3/uL (ref 3.4–10.8)

## 2023-02-27 LAB — HEMOGLOBIN A1C
Est. average glucose Bld gHb Est-mCnc: 126 mg/dL
Hgb A1c MFr Bld: 6 % — ABNORMAL HIGH (ref 4.8–5.6)

## 2023-02-27 LAB — LIPID PANEL
Chol/HDL Ratio: 2.9 ratio (ref 0.0–4.4)
Cholesterol, Total: 259 mg/dL — ABNORMAL HIGH (ref 100–199)
HDL: 90 mg/dL (ref >39)
LDL Chol Calc (NIH): 156 mg/dL — ABNORMAL HIGH (ref 0–99)
Triglycerides: 79 mg/dL (ref 0–149)
VLDL Cholesterol Cal: 13 mg/dL (ref 5–40)

## 2023-02-27 LAB — VITAMIN B12: Vitamin B-12: 520 pg/mL (ref 232–1245)

## 2023-02-27 LAB — TSH+FREE T4
Free T4: 1.16 ng/dL (ref 0.82–1.77)
TSH: 1.63 u[IU]/mL (ref 0.450–4.500)

## 2023-02-27 LAB — VITAMIN D 25 HYDROXY (VIT D DEFICIENCY, FRACTURES): Vit D, 25-Hydroxy: 29.8 ng/mL — ABNORMAL LOW (ref 30.0–100.0)

## 2023-02-28 ENCOUNTER — Other Ambulatory Visit: Payer: Self-pay | Admitting: Family Medicine

## 2023-02-28 DIAGNOSIS — E559 Vitamin D deficiency, unspecified: Secondary | ICD-10-CM

## 2023-02-28 DIAGNOSIS — E7849 Other hyperlipidemia: Secondary | ICD-10-CM

## 2023-02-28 MED ORDER — ROSUVASTATIN CALCIUM 10 MG PO TABS: 10.0000 mg | ORAL_TABLET | Freq: Every day | ORAL | 1 refills | Status: DC

## 2023-02-28 MED ORDER — VITAMIN D (ERGOCALCIFEROL) 1.25 MG (50000 UNIT) PO CAPS: 50000.0000 [IU] | ORAL_CAPSULE | ORAL | 2 refills | Status: DC

## 2023-02-28 NOTE — Progress Notes (Signed)
Please inform the patient that a prescription for rosuvastatin 10 mg is sent to her pharmacy. Please encourage the patient to continue taking her other cholesterol-lowering medications. I recommend avoiding simple carbohydrates, including cakes, sweet desserts, ice cream, soda (diet or regular), sweet tea, candies, chips, cookies, store-bought juices, alcohol in excess of 1-2 drinks a day, lemonade, artificial sweeteners, donuts, coffee creamers, and sugar-free products.  I recommend avoiding greasy, fatty foods with increased physical activity. Her cholesterol levels are elevated. I have refilled her vitamin D supplements to continue taking.

## 2023-03-01 DIAGNOSIS — H43813 Vitreous degeneration, bilateral: Secondary | ICD-10-CM | POA: Diagnosis not present

## 2023-03-01 DIAGNOSIS — H25813 Combined forms of age-related cataract, bilateral: Secondary | ICD-10-CM | POA: Diagnosis not present

## 2023-03-01 DIAGNOSIS — H04123 Dry eye syndrome of bilateral lacrimal glands: Secondary | ICD-10-CM | POA: Diagnosis not present

## 2023-03-01 DIAGNOSIS — H524 Presbyopia: Secondary | ICD-10-CM | POA: Diagnosis not present

## 2023-03-03 ENCOUNTER — Other Ambulatory Visit: Payer: Self-pay

## 2023-03-03 ENCOUNTER — Telehealth: Payer: Self-pay | Admitting: Family Medicine

## 2023-03-03 DIAGNOSIS — I10 Essential (primary) hypertension: Secondary | ICD-10-CM

## 2023-03-03 MED ORDER — AMLODIPINE BESYLATE 5 MG PO TABS: 5.0000 mg | ORAL_TABLET | Freq: Every day | ORAL | 1 refills | Status: DC

## 2023-03-03 NOTE — Telephone Encounter (Signed)
Pt called in for lab results  °

## 2023-03-03 NOTE — Telephone Encounter (Signed)
Pt informed of labs

## 2023-03-04 DIAGNOSIS — Z0100 Encounter for examination of eyes and vision without abnormal findings: Secondary | ICD-10-CM | POA: Diagnosis not present

## 2023-05-31 ENCOUNTER — Ambulatory Visit: Payer: Medicare HMO | Admitting: Family Medicine

## 2023-08-10 ENCOUNTER — Ambulatory Visit: Payer: Medicare HMO | Admitting: Orthopedic Surgery

## 2023-08-18 ENCOUNTER — Other Ambulatory Visit: Payer: Self-pay | Admitting: Family Medicine

## 2023-08-18 DIAGNOSIS — I10 Essential (primary) hypertension: Secondary | ICD-10-CM

## 2023-08-24 ENCOUNTER — Encounter: Payer: Self-pay | Admitting: Obstetrics & Gynecology

## 2023-08-24 ENCOUNTER — Telehealth: Payer: Self-pay | Admitting: Family Medicine

## 2023-08-24 ENCOUNTER — Encounter: Payer: Self-pay | Admitting: Emergency Medicine

## 2023-08-24 ENCOUNTER — Ambulatory Visit: Payer: Medicare HMO | Admitting: Obstetrics & Gynecology

## 2023-08-24 ENCOUNTER — Ambulatory Visit
Admission: EM | Admit: 2023-08-24 | Discharge: 2023-08-24 | Disposition: A | Discharge status: Home / Self Care | Payer: Medicare HMO

## 2023-08-24 VITALS — BP 112/64 | HR 81 | Ht 64.0 in | Wt 177.0 lb

## 2023-08-24 DIAGNOSIS — N814 Uterovaginal prolapse, unspecified: Secondary | ICD-10-CM

## 2023-08-24 DIAGNOSIS — N812 Incomplete uterovaginal prolapse: Secondary | ICD-10-CM | POA: Diagnosis not present

## 2023-08-24 NOTE — Progress Notes (Signed)
Chief Complaint  Patient presents with   Vaginal Prolapse    Patient states she can see and feel prolapse, but it will go back up sometimes.    Blood pressure 112/64, pulse 81, height 5\' 4"  (1.626 m), weight 177 lb (80.3 kg).  Megan [name] presents today as a referral from Urgent Care for uterine prolapse She has been having pain pressure and feeling like something has fallen She does a lot of manual work, lifting  She had 3 vaginal deliveries, the largest was 9 lbs  On exam she has Grade 3 cystocoele and grade 2 uterine prolapse, uterus would be worse but bladder is holding it up   She reports no vaginal discharge and no vaginal bleeding   Likert scale(1 not bothersome -5 very bothersome)  :  1  Exam reveals no undue vaginal mucosal pressure of breakdown, no discharge and no vaginal bleeding.  Vaginal Epithelial Abnormality Classification System:   0 0    No abnormalities 1    Epithelial erythema 2    Granulation tissue 3    Epithelial break or erosion, 1 cm or less 4    Epithelial break or erosion, 1 cm or greater  The pessary is rfit without difficulty Milex ring #3    ICD-10-CM   1. Uterine prolapse, grade 2  N81.4     2. 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  [name], MD  08/24/2023 4:01 PM

## 2023-08-24 NOTE — ED Provider Notes (Signed)
[facility]    CSN: [id number] Arrival date & time: 08/24/23  1307      History   Chief Complaint No chief complaint on file.   HPI Megan [name] is a 72 y.o. female.   The history is provided by the patient.   Patient presents for concerns for a "protrusion" in her private area.  Patient states she noticed symptoms initially this week.  She states that the area is "as long as her finger" and feels like a "egg".  She states that she notices when she is up more on her feet.  She states that she has also noticed it rubbing against her leg.  She states that the area does "go back up".  Patient denies pelvic pain, bleeding, vaginal discharge, vaginal odor, vaginal itching.  Patient reports that she is not sexually active.  She states that she also has not had any urinary symptoms.  Patient also denies prior history of hysterectomy or endometrial concerns.  Patient reports that she did attempt to see her PCP, but she could not get an appointment.  Past Medical History:  Diagnosis Date   Gout    High cholesterol    Hypertension    Kidney stone    Leukopenia 05/04/2016   Rheumatoid arthritis (HCC)    Seasonal allergies    Thyroid disease     Patient Active Problem List   Diagnosis Date Noted   Pain behind the ear, left 02/26/2023   Great toe pain, right 12/22/2022   Laceration of right great toe 12/22/2022   Enuresis, nocturnal only 11/25/2022   Sinusitis, acute, maxillary 09/15/2022   Trochanteric bursitis, right hip 07/30/2022   Annual physical exam 05/26/2022   Impaired vision in both eyes 05/26/2022   Elevated ferritin level 05/26/2022   PVC (premature ventricular contraction) 04/17/2022   Obesity (BMI 30-39.9) 01/23/2022   Irregular heart beat 08/21/2021   Acute gout of left ankle 07/15/2021   Osteoporosis 02/20/2021   GERD (gastroesophageal reflux disease) 07/14/2019   Acquired renal cyst of right kidney 07/14/2019   Rheumatoid arthritis (HCC) 05/09/2019    Arthritis of carpometacarpal (CMC) joint of right thumb 12/01/2018   Medial epicondylitis of elbow, right 12/01/2018   Hypertension 05/19/2017   DDD (degenerative disc disease), lumbar 05/19/2017   Hyperlipidemia 05/19/2017   Prediabetes 05/19/2017   Iron deficiency 05/19/2017   Hypothyroidism 05/19/2017   Leukopenia 05/04/2016   Arthritis of right knee 03/01/2012   Knee pain 02/25/2012    Past Surgical History:  Procedure Laterality Date   BILATERAL KNEE ARTHROSCOPY     COLONOSCOPY N/A 07/30/2014   Procedure: COLONOSCOPY;  Surgeon: West Bali, MD;  Location: [facility];  Service: Endoscopy;  Laterality: N/A;  9:30 AM   high cholesterol     Right knee osteotomy      OB History   No obstetric history on file.      Home Medications    Prior to Admission medications   Medication Sig Start Date End Date Taking? Authorizing Provider  amLODipine (NORVASC) 5 MG tablet TAKE 1 TABLET EVERY DAY 08/18/23   Gilmore Laroche, FNP  CINNAMON PO Take by mouth. As needed    [provider]  cyanocobalamin (VITAMIN B12) 1000 MCG tablet Take 1 tablet (1,000 mcg total) by mouth daily. 02/25/23   Gilmore Laroche, FNP  ezetimibe (ZETIA) 10 MG tablet Take 1 tablet (10 mg total) by mouth daily. 12/02/22   Gilmore Laroche, FNP  GARLIC-LECITHIN PO Take by  mouth.    [provider]  Ginger, Zingiber officinalis, (GINGER PO) Take by mouth. As needed    [provider]  Magnesium 200 MG TABS Take by mouth.    [provider]  mirabegron ER (MYRBETRIQ) 25 MG TB24 tablet Take 1 tablet (25 mg total) by mouth daily. 11/25/22   Gilmore Laroche, FNP  Misc Natural Products (GINKOGIN PO) Take by mouth.    [provider]  Multiple Vitamins-Minerals (ONE-A-DAY WOMENS PO) Take by mouth. As needed    [provider]  omega-3 acid ethyl esters (LOVAZA) 1 g capsule Take 1 capsule (1 g total) by mouth 2 (two) times daily. 12/04/22   Gilmore Laroche, FNP   rosuvastatin (CRESTOR) 10 MG tablet Take 1 tablet (10 mg total) by mouth daily. 02/28/23   Gilmore Laroche, FNP  UNABLE TO FIND Med Name: Celery seed tab 1,000mg . Takes once weekly.    [provider]  Vitamin D, Ergocalciferol, (DRISDOL) 1.25 MG (50000 UNIT) CAPS capsule Take 1 capsule (50,000 Units total) by mouth every 7 (seven) days. 02/28/23   Gilmore Laroche, FNP    Family History Family History  Problem Relation Age of Onset   Hypertension Mother    Asthma Mother    Other Father        Rare blood disease   Hypertension Brother    Colon cancer Paternal Uncle    Bladder Cancer Neg Hx    Kidney cancer Neg Hx     Social History Social History   Tobacco Use   Smoking status: Never   Smokeless tobacco: Never  Vaping Use   Vaping status: Never Used  Substance Use Topics   Alcohol use: No   Drug use: No    Comment: used CBD oil     Allergies   Other, Hydrocodone, and Naproxen   Review of Systems Review of Systems Per HPI  Physical Exam Triage Vital Signs ED Triage Vitals [08/24/23 1314]  Encounter Vitals Group     BP (!) 148/91     Systolic BP Percentile      Diastolic BP Percentile      Pulse Rate 74     Resp 18     Temp 98.1 F (36.7 C)     Temp Source Oral     SpO2 95 %     Weight      Height      Head Circumference      Peak Flow      Pain Score 0     Pain Loc      Pain Education      Exclude from Growth Chart    No data found.  Updated Vital Signs BP (!) 148/91 (BP Location: Right Arm)   Pulse 74   Temp 98.1 F (36.7 C) (Oral)   Resp 18   SpO2 95%   Visual Acuity Right Eye Distance:   Left Eye Distance:   Bilateral Distance:    Right Eye Near:   Left Eye Near:    Bilateral Near:     Physical Exam Vitals and nursing note reviewed. Exam conducted with a chaperone present (Meg, RT).  Constitutional:      General: She is not in acute distress.    Appearance: Normal appearance.  HENT:     Head: Normocephalic.  Eyes:      Extraocular Movements: Extraocular movements intact.     Pupils: Pupils are equal, round, and reactive to light.  Cardiovascular:  Rate and Rhythm: Normal rate and regular rhythm.     Pulses: Normal pulses.     Heart sounds: Normal heart sounds.  Pulmonary:     Effort: Pulmonary effort is normal.     Breath sounds: Normal breath sounds.  Abdominal:     General: Bowel sounds are normal.     Palpations: Abdomen is soft.     Hernia: There is no hernia in the left inguinal area or right inguinal area.  Genitourinary:    Exam position: Prone.     Pubic Area: No rash.      Labia:        Right: No rash, tenderness, lesion or injury.        Left: No rash, tenderness, lesion or injury.      Urethra: No prolapse, urethral pain, urethral swelling or urethral lesion.     Comments: Pelvic exam performed.  No abnormalities were present during the time of the exam.  No labial swelling, tenderness, odor, or discharge present. Musculoskeletal:     Cervical back: Normal range of motion.  Lymphadenopathy:     Cervical: No cervical adenopathy.  Skin:    General: Skin is warm and dry.  Neurological:     General: No focal deficit present.     Mental Status: She is alert and oriented to person, place, and time.  Psychiatric:        Mood and Affect: Mood normal.        Behavior: Behavior normal.      UC Treatments / Results  Labs (all labs ordered are listed, but only abnormal results are displayed) Labs Reviewed - No data to display  EKG   Radiology No results found.  Procedures Procedures (including critical care time)  Medications Ordered in UC Medications - No data to display  Initial Impression / Assessment and Plan / UC Course  I have reviewed the triage vital signs and the nursing notes.  Pertinent labs & imaging results that were available during my care of the patient were reviewed by me and considered in my medical decision making (see chart for details).  The patient  is well-appearing, she is in no acute distress, vital signs are stable.  On exam, there was no obvious protrusion noted from the patient notes vaginal region.  Suspect patient may be experiencing uterine prolapse.  Given the patient's description of her symptoms, symptoms appear to be consistent.  Will have patient follow-up with her primary care physician or gynecologist for further evaluation.  Patient is in agreement with this plan of care and verbalizes understanding.  All questions were answered.  Patient stable for discharge.  Final Clinical Impressions(s) / UC Diagnoses   Final diagnoses:  Incomplete uterine prolapse     Discharge Instructions      As discussed, I suspect she may be experiencing uterine prolapse.  I would like for you to follow-up with your primary care physician or gynecology for further evaluation. Follow-up as needed.     ED Prescriptions   None    PDMP not reviewed this encounter.   Abran Cantor, NP 08/24/23 1358

## 2023-08-24 NOTE — Telephone Encounter (Signed)
Pt came by office requesting to speak with provider , is needing a call back as soon as possible

## 2023-08-24 NOTE — ED Triage Notes (Signed)
States she feels something protruding in her private area.  Noticed it this week.

## 2023-08-24 NOTE — Discharge Instructions (Addendum)
As discussed, I suspect she may be experiencing uterine prolapse.  I would like for you to follow-up with your primary care physician or gynecology for further evaluation. Follow-up as needed.

## 2023-08-27 ENCOUNTER — Ambulatory Visit: Payer: Medicare HMO | Admitting: Orthopedic Surgery

## 2023-08-31 ENCOUNTER — Ambulatory Visit: Payer: Medicare HMO | Admitting: Family Medicine

## 2023-09-03 ENCOUNTER — Encounter: Payer: Self-pay | Admitting: Family Medicine

## 2023-09-09 ENCOUNTER — Ambulatory Visit: Payer: Medicare HMO | Admitting: Orthopedic Surgery

## 2023-09-09 ENCOUNTER — Encounter: Payer: Self-pay | Admitting: Orthopedic Surgery

## 2023-09-09 VITALS — BP 130/76 | HR 86 | Ht 65.0 in | Wt 178.0 lb

## 2023-09-09 DIAGNOSIS — M1711 Unilateral primary osteoarthritis, right knee: Secondary | ICD-10-CM

## 2023-09-09 NOTE — Progress Notes (Signed)
Annual follow-up visit  Encounter Diagnosis  Name Primary?   Arthritis of right knee Yes     Chief Complaint  Patient presents with   Knee Pain    Fu right knee doing well only pain when gins weight     History of right knee opening wedge tibial osteotomy  No complaints of pain at this time  Right knee full range of motion no tenderness swelling or lack of stability  X-ray shows a medial opening wedge plate narrowing of the medial compartment grade 3 arthritis  Impression stable relatively asymptomatic status post opening wedge tibial osteotomy with grade 3 arthritis follow-up 1 year repeat x-ray

## 2023-09-21 ENCOUNTER — Ambulatory Visit: Payer: Medicare HMO | Admitting: Obstetrics & Gynecology

## 2023-09-21 ENCOUNTER — Encounter: Payer: Self-pay | Admitting: Obstetrics & Gynecology

## 2023-09-21 VITALS — BP 116/78 | HR 70 | Wt 177.0 lb

## 2023-09-21 DIAGNOSIS — N814 Uterovaginal prolapse, unspecified: Secondary | ICD-10-CM | POA: Diagnosis not present

## 2023-09-21 DIAGNOSIS — Z4689 Encounter for fitting and adjustment of other specified devices: Secondary | ICD-10-CM

## 2023-09-21 NOTE — Progress Notes (Signed)
Chief Complaint  Patient presents with   Follow-up    Pessary 1 month follow up    Blood pressure 116/78, pulse 70, weight 177 lb (80.3 kg).  Megan [name] presents today for routine follow up related to her pessary.   She uses a Milex ring with support #3 She reports no vaginal discharge and no vaginal bleeding   Likert scale(1 not bothersome -5 very bothersome)  :  1  Exam reveals no undue vaginal mucosal pressure of breakdown, no discharge and no vaginal bleeding.  Vaginal Epithelial Abnormality Classification System:   0 0    No abnormalities 1    Epithelial erythema 2    Granulation tissue 3    Epithelial break or erosion, 1 cm or less 4    Epithelial break or erosion, 1 cm or greater  The pessary is removed, cleaned and replaced without difficulty.      ICD-10-CM   1. Pessary maintenance: Milex ring with support #3, original placement 08/24/23  Z46.89     2. Uterine prolapse, grade 2  N81.4     3. 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  [name], MD  09/21/2023 11:58 AM

## 2023-09-30 ENCOUNTER — Other Ambulatory Visit: Payer: Self-pay

## 2023-09-30 ENCOUNTER — Encounter: Payer: Self-pay | Admitting: Family Medicine

## 2023-09-30 ENCOUNTER — Ambulatory Visit: Payer: Medicare HMO | Admitting: Family Medicine

## 2023-09-30 ENCOUNTER — Other Ambulatory Visit: Payer: Medicare HMO

## 2023-09-30 VITALS — BP 118/82 | HR 74 | Temp 98.3°F | Ht 65.0 in | Wt 178.4 lb

## 2023-09-30 DIAGNOSIS — I10 Essential (primary) hypertension: Secondary | ICD-10-CM

## 2023-09-30 DIAGNOSIS — Z0001 Encounter for general adult medical examination with abnormal findings: Secondary | ICD-10-CM | POA: Diagnosis not present

## 2023-09-30 DIAGNOSIS — Z780 Asymptomatic menopausal state: Secondary | ICD-10-CM

## 2023-09-30 DIAGNOSIS — Z1231 Encounter for screening mammogram for malignant neoplasm of breast: Secondary | ICD-10-CM

## 2023-09-30 DIAGNOSIS — M810 Age-related osteoporosis without current pathological fracture: Secondary | ICD-10-CM | POA: Diagnosis not present

## 2023-09-30 DIAGNOSIS — Z7689 Persons encountering health services in other specified circumstances: Secondary | ICD-10-CM

## 2023-09-30 DIAGNOSIS — Z1211 Encounter for screening for malignant neoplasm of colon: Secondary | ICD-10-CM

## 2023-09-30 DIAGNOSIS — R7303 Prediabetes: Secondary | ICD-10-CM | POA: Diagnosis not present

## 2023-09-30 DIAGNOSIS — Z0000 Encounter for general adult medical examination without abnormal findings: Secondary | ICD-10-CM

## 2023-09-30 DIAGNOSIS — Z13820 Encounter for screening for osteoporosis: Secondary | ICD-10-CM

## 2023-09-30 NOTE — Patient Instructions (Signed)
It was great to meet you today and I'm excited to have you join the [facility] practice. I hope you had a positive experience today! If you feel so inclined, please feel free to recommend our practice to friends and family. Mila Merry, FNP-C

## 2023-09-30 NOTE — Progress Notes (Signed)
New Patient Office Visit  Subjective    Patient ID: [name], female    DOB: [birth year]  Age: 72 y.o. MRN: [id number]  CC:  Chief Complaint  Patient presents with   New Patient (Initial Visit)    HPI Megan [name] presents to establish care. Oriented to practice routines and expectations. PMH as below. She takes Amlodipine for her BP and monitors that several times daily. Denies chest pain, palpitations, lightheadedness, dizziness, shortness of breath, vision changes, recurrent headaches, swelling of extremities.   Breast CA screening: Mammogram status: Completed 04/29/2022. Repeat every year Colon CA screening: colonoscopy 9 years ago without abnormalities. DEXA: DEXA scan ordered to follow up existing osteoporosis. Tobacco: non-smoker UXL:[id number] Vaccines:  UTD other than covid, declines this  HYPERTENSION without Chronic Kidney Disease Hypertension status: controlled  Satisfied with current treatment? yes Duration of hypertension: chronic BP monitoring frequency:  a few times a day BP range: <130/80 BP medication side effects:  no Medication compliance: excellent compliance Previous BP meds:amlodipine Aspirin: no Recurrent headaches: no Visual changes: no Palpitations: no Dyspnea: no Chest pain: no Lower extremity edema: no Dizzy/lightheaded: no   Outpatient Encounter Medications as of 09/30/2023  Medication Sig   amLODipine (NORVASC) 5 MG tablet TAKE 1 TABLET EVERY DAY   CINNAMON PO Take by mouth. As needed   cyanocobalamin (VITAMIN B12) 1000 MCG tablet Take 1 tablet (1,000 mcg total) by mouth daily.   GARLIC-LECITHIN PO Take by mouth.   Ginger, Zingiber officinalis, (GINGER PO) Take by mouth. As needed   Magnesium 200 MG TABS Take by mouth.   Misc Natural Products (GINKOGIN PO) Take by mouth.   Multiple Vitamins-Minerals (ONE-A-DAY WOMENS PO) Take by mouth. As needed   Omega 3 1000 MG CAPS Take 1,000 mg by mouth at bedtime.   UNABLE TO FIND Med Name:  Celery seed tab 1,000mg . Takes once weekly.   Vitamin D, Ergocalciferol, (DRISDOL) 1.25 MG (50000 UNIT) CAPS capsule Take 1 capsule (50,000 Units total) by mouth every 7 (seven) days.   ezetimibe (ZETIA) 10 MG tablet Take 1 tablet (10 mg total) by mouth daily. (Patient not taking: Reported on 09/21/2023)   mirabegron ER (MYRBETRIQ) 25 MG TB24 tablet Take 1 tablet (25 mg total) by mouth daily. (Patient not taking: Reported on 08/24/2023)   omega-3 acid ethyl esters (LOVAZA) 1 g capsule Take 1 capsule (1 g total) by mouth 2 (two) times daily. (Patient not taking: Reported on 09/30/2023)   rosuvastatin (CRESTOR) 10 MG tablet Take 1 tablet (10 mg total) by mouth daily. (Patient not taking: Reported on 08/24/2023)   No facility-administered encounter medications on file as of 09/30/2023.    Past Medical History:  Diagnosis Date   Gout    High cholesterol    Hypertension    Kidney stone    Leukopenia 05/04/2016   Rheumatoid arthritis (HCC)    Seasonal allergies    Thyroid disease     Past Surgical History:  Procedure Laterality Date   BILATERAL KNEE ARTHROSCOPY     COLONOSCOPY N/A 07/30/2014   Procedure: COLONOSCOPY;  Surgeon: West Bali, MD;  [facility];  Service: Endoscopy;  Laterality: N/A;  9:30 AM   high cholesterol     Right knee osteotomy      Family History  Problem Relation Age of Onset   Other Father        Rare blood disease   Hypertension Mother    Asthma Mother    Hypertension Brother  Colon cancer Paternal Uncle    Cancer Maternal Uncle    Bladder Cancer Neg Hx    Kidney cancer Neg Hx     Social History   Socioeconomic History   Marital status: Married    Spouse name: Not on file   Number of children: 4   Years of education: Not on file   Highest education level: Not on file  Occupational History   Occupation: Retired; Works PT in  [employer] at [employer] in [place]  Tobacco Use   Smoking status: Never   Smokeless tobacco: Never   Vaping Use   Vaping status: Never Used  Substance and Sexual Activity   Alcohol use: No   Drug use: No    Comment: used CBD oil   Sexual activity: Not Currently  Other Topics Concern   Not on file  Social History Narrative   3 biological children and 1 adopted   Social Determinants of Health   Financial Resource Strain: Low Risk  (02/25/2023)   Overall Financial Resource Strain (CARDIA)    Difficulty of Paying Living Expenses: Not hard at all  Food Insecurity: No Food Insecurity (02/25/2023)   Hunger Vital Sign    Worried About Running Out of Food in the Last Year: Never true    Ran Out of Food in the Last Year: Never true  Transportation Needs: No Transportation Needs (02/25/2023)   PRAPARE - Administrator, Civil Service (Medical): No    Lack of Transportation (Non-Medical): No  Physical Activity: Sufficiently Active (02/25/2023)   Exercise Vital Sign    Days of Exercise per Week: 3 days    Minutes of Exercise per Session: 60 min  Stress: No Stress Concern Present (02/25/2023)   [employer] of Occupational Health - Occupational Stress Questionnaire    Feeling of Stress : Not at all  Social Connections: Moderately Isolated (02/25/2023)   Social Connection and Isolation Panel [NHANES]    Frequency of Communication with Friends and Family: More than three times a week    Frequency of Social Gatherings with Friends and Family: Once a week    Attends Religious Services: Never    Database administrator or Organizations: No    Attends Banker Meetings: Never    Marital Status: Married  Catering manager Violence: Not At Risk (02/25/2023)   Humiliation, Afraid, Rape, and Kick questionnaire    Fear of Current or Ex-Partner: No    Emotionally Abused: No    Physically Abused: No    Sexually Abused: No    Review of Systems  Constitutional: Negative.   HENT: Negative.    Eyes: Negative.   Respiratory: Negative.    Cardiovascular: Negative.    Gastrointestinal: Negative.   Genitourinary: Negative.   Musculoskeletal: Negative.   Skin: Negative.   Neurological: Negative.   Endo/Heme/Allergies: Negative.   Psychiatric/Behavioral: Negative.    All other systems reviewed and are negative.       Objective    BP 118/82   Pulse 74   Temp 98.3 F (36.8 C)   Ht 5\' 5"  (1.651 m)   Wt 178 lb 6 oz (80.9 kg)   SpO2 96%   BMI 29.68 kg/m   Physical Exam Vitals and nursing note reviewed.  Constitutional:      Appearance: Normal appearance. She is overweight.  HENT:     Head: Normocephalic and atraumatic.     Right Ear: Tympanic membrane, ear canal and external ear normal.  Left Ear: Tympanic membrane, ear canal and external ear normal.     Nose: Nose normal.     Mouth/Throat:     Mouth: Mucous membranes are moist.     Pharynx: Oropharynx is clear.  Eyes:     Extraocular Movements: Extraocular movements intact.     Conjunctiva/sclera: Conjunctivae normal.     Pupils: Pupils are equal, round, and reactive to light.  Cardiovascular:     Rate and Rhythm: Normal rate and regular rhythm.     Pulses: Normal pulses.     Heart sounds: Normal heart sounds.  Pulmonary:     Effort: Pulmonary effort is normal.     Breath sounds: Normal breath sounds.  Abdominal:     General: Bowel sounds are normal.     Palpations: Abdomen is soft.  Musculoskeletal:        General: Normal range of motion.     Cervical back: Normal range of motion and neck supple.  Skin:    General: Skin is warm and dry.     Capillary Refill: Capillary refill takes less than 2 seconds.  Neurological:     General: No focal deficit present.     Mental Status: She is alert and oriented to person, place, and time. Mental status is at baseline.  Psychiatric:        Mood and Affect: Mood normal.        Behavior: Behavior normal.        Thought Content: Thought content normal.        Judgment: Judgment normal.         Assessment & Plan:   Problem  List Items Addressed This Visit     Hypertension    Chronic well controlled. She reports home readings are often in the 90s SBP. Will trial off Amlodipine 5mg  daily. Continue to monitor home readings and resume if sustain >130/80. Recommend heart healthy diet such as Mediterranean diet with whole grains, fruits, vegetable, fish, lean meats, nuts, and olive oil. Limit salt. Encouraged moderate walking, 3-5 times/week for 30-50 minutes each session. Aim for at least 150 minutes.week. Goal should be pace of 3 miles/hours, or walking 1.5 miles in 30 minutes. Avoid tobacco products. Avoid excess alcohol. Take medications as prescribed and bring medications and blood pressure log with cuff to each office visit. Seek medical care for chest pain, palpitations, shortness of breath with exertion, dizziness/lightheadedness, vision changes, recurrent headaches, or swelling of extremities.       Prediabetes    A1c today.      Osteoporosis    T score -2.8 right femur neck 01/2021. Will order repeat as this is overdue. She does take Vitamin D and Calcium.      Relevant Orders   DG Bone Density   Physical exam, annual - Primary    Today your medical history was reviewed and routine physical exam with labs was performed. Recommend 150 minutes of moderate intensity exercise weekly and consuming a well-balanced diet. Advised to stop smoking if a smoker, avoid smoking if a non-smoker, limit alcohol consumption to 1 drink per day for women and 2 drinks per day for men, and avoid illicit drug use. Counseled on safe sex practices and offered STI testing today. Counseled on the importance of sunscreen use. Counseled in mental health awareness and when to seek medical care. Vaccine maintenance discussed. Appropriate health maintenance items reviewed. Return to office in 1 year for annual physical exam.       Other Visit  Diagnoses     Encounter for osteoporosis screening in asymptomatic postmenopausal patient        Encounter for screening mammogram for malignant neoplasm of breast       Relevant Orders   MM DIGITAL SCREENING BILATERAL   Colon cancer screening       Relevant Orders   Ambulatory referral to Gastroenterology   Encounter to establish care           Return in about 4 weeks (around 10/28/2023) for hypertension.   Park Meo, FNP

## 2023-09-30 NOTE — Assessment & Plan Note (Signed)
T score -2.8 right femur neck 01/2021. Will order repeat as this is overdue. She does take Vitamin D and Calcium.

## 2023-09-30 NOTE — Assessment & Plan Note (Signed)

## 2023-09-30 NOTE — Assessment & Plan Note (Signed)
Chronic well controlled. She reports home readings are often in the 90s SBP. Will trial off Amlodipine 5mg  daily. Continue to monitor home readings and resume if sustain >130/80. Recommend heart healthy diet such as Mediterranean diet with whole grains, fruits, vegetable, fish, lean meats, nuts, and olive oil. Limit salt. Encouraged moderate walking, 3-5 times/week for 30-50 minutes each session. Aim for at least 150 minutes.week. Goal should be pace of 3 miles/hours, or walking 1.5 miles in 30 minutes. Avoid tobacco products. Avoid excess alcohol. Take medications as prescribed and bring medications and blood pressure log with cuff to each office visit. Seek medical care for chest pain, palpitations, shortness of breath with exertion, dizziness/lightheadedness, vision changes, recurrent headaches, or swelling of extremities.

## 2023-09-30 NOTE — Assessment & Plan Note (Signed)
A1c today.  

## 2023-10-01 LAB — CBC WITH DIFFERENTIAL/PLATELET
Absolute Lymphocytes: 1527 {cells}/uL (ref 850–3900)
Absolute Monocytes: 475 {cells}/uL (ref 200–950)
Basophils Absolute: 22 {cells}/uL (ref 0–200)
Basophils Relative: 0.5 %
Eosinophils Absolute: 282 {cells}/uL (ref 15–500)
Eosinophils Relative: 6.4 %
HCT: 38.4 % (ref 35.0–45.0)
Hemoglobin: 12.7 g/dL (ref 11.7–15.5)
MCH: 29.5 pg (ref 27.0–33.0)
MCHC: 33.1 g/dL (ref 32.0–36.0)
MCV: 89.1 fL (ref 80.0–100.0)
MPV: 11.7 fL (ref 7.5–12.5)
Monocytes Relative: 10.8 %
Neutro Abs: 2094 {cells}/uL (ref 1500–7800)
Neutrophils Relative %: 47.6 %
Platelets: 257 10*3/uL (ref 140–400)
RBC: 4.31 10*6/uL (ref 3.80–5.10)
RDW: 12.4 % (ref 11.0–15.0)
Total Lymphocyte: 34.7 %
WBC: 4.4 10*3/uL (ref 3.8–10.8)

## 2023-10-01 LAB — COMPLETE METABOLIC PANEL WITH GFR
AG Ratio: 1.6 (calc) (ref 1.0–2.5)
ALT: 12 U/L (ref 6–29)
AST: 17 U/L (ref 10–35)
Albumin: 4.3 g/dL (ref 3.6–5.1)
Alkaline phosphatase (APISO): 93 U/L (ref 37–153)
BUN: 14 mg/dL (ref 7–25)
CO2: 29 mmol/L (ref 20–32)
Calcium: 10.3 mg/dL (ref 8.6–10.4)
Chloride: 104 mmol/L (ref 98–110)
Creat: 0.7 mg/dL (ref 0.60–1.00)
Globulin: 2.7 g/dL (ref 1.9–3.7)
Glucose, Bld: 90 mg/dL (ref 65–99)
Potassium: 4.7 mmol/L (ref 3.5–5.3)
Sodium: 141 mmol/L (ref 135–146)
Total Bilirubin: 0.4 mg/dL (ref 0.2–1.2)
Total Protein: 7 g/dL (ref 6.1–8.1)
eGFR: 91 mL/min/{1.73_m2} (ref >=60)

## 2023-10-01 LAB — LIPID PANEL
Cholesterol: 259 mg/dL — ABNORMAL HIGH (ref <200)
HDL: 87 mg/dL (ref >=50)
LDL Cholesterol (Calc): 154 mg/dL — ABNORMAL HIGH
Non-HDL Cholesterol (Calc): 172 mg/dL — ABNORMAL HIGH (ref <130)
Total CHOL/HDL Ratio: 3 (calc) (ref <5.0)
Triglycerides: 81 mg/dL (ref <150)

## 2023-10-01 LAB — HEMOGLOBIN A1C
Hgb A1c MFr Bld: 6.2 %{Hb} — ABNORMAL HIGH (ref <5.7)
Mean Plasma Glucose: 131 mg/dL
eAG (mmol/L): 7.3 mmol/L

## 2023-10-01 LAB — THYROID PANEL WITH TSH
Free Thyroxine Index: 2.1 (ref 1.4–3.8)
T3 Uptake: 29 % (ref 22–35)
T4, Total: 7.3 ug/dL (ref 5.1–11.9)
TSH: 1.54 m[IU]/L (ref 0.40–4.50)

## 2023-10-04 ENCOUNTER — Encounter (INDEPENDENT_AMBULATORY_CARE_PROVIDER_SITE_OTHER): Payer: Self-pay | Admitting: *Deleted

## 2023-10-05 ENCOUNTER — Other Ambulatory Visit: Payer: Self-pay | Admitting: Family Medicine

## 2023-10-05 DIAGNOSIS — E782 Mixed hyperlipidemia: Secondary | ICD-10-CM

## 2023-10-05 DIAGNOSIS — R7303 Prediabetes: Secondary | ICD-10-CM

## 2023-10-05 DIAGNOSIS — E7849 Other hyperlipidemia: Secondary | ICD-10-CM

## 2023-10-05 MED ORDER — ROSUVASTATIN CALCIUM 10 MG PO TABS: 10.0000 mg | ORAL_TABLET | Freq: Every day | ORAL | 1 refills | Status: DC

## 2023-10-07 ENCOUNTER — Encounter: Payer: Self-pay | Admitting: Family Medicine

## 2023-10-20 ENCOUNTER — Ambulatory Visit (HOSPITAL_COMMUNITY)
Admission: RE | Admit: 2023-10-20 | Discharge: 2023-10-20 | Disposition: A | Discharge status: Home / Self Care | Payer: Medicare HMO | Source: Ambulatory Visit | Attending: Family Medicine | Admitting: Family Medicine

## 2023-10-20 ENCOUNTER — Telehealth (INDEPENDENT_AMBULATORY_CARE_PROVIDER_SITE_OTHER): Payer: Self-pay | Admitting: Gastroenterology

## 2023-10-20 DIAGNOSIS — Z780 Asymptomatic menopausal state: Secondary | ICD-10-CM | POA: Diagnosis not present

## 2023-10-20 DIAGNOSIS — M810 Age-related osteoporosis without current pathological fracture: Secondary | ICD-10-CM | POA: Diagnosis not present

## 2023-10-20 DIAGNOSIS — Z1231 Encounter for screening mammogram for malignant neoplasm of breast: Secondary | ICD-10-CM | POA: Diagnosis not present

## 2023-10-20 NOTE — Telephone Encounter (Signed)
Who is your primary care physician: Lynnea Ferrier; Kurtis Bushman (FNP)  Reasons for the colonoscopy: Recall  Have you had a colonoscopy before?  Yes  5 or more years ago  Do you have family history of colon cancer? no  Previous colonoscopy with polyps removed? I think 1  Do you have a history colorectal cancer?   no  Are you diabetic? If yes, Type 1 or Type 2?    On the line  Do you have a prosthetic or mechanical heart valve? no  Do you have a pacemaker/defibrillator?   no  Have you had endocarditis/atrial fibrillation? no  Have you had joint replacement within the last 12 months?  no  Do you tend to be constipated or have to use laxatives? no  Do you have any history of drugs or alchohol?  no  Do you use supplemental oxygen?  no  Have you had a stroke or heart attack within the last 6 months? no  Do you take weight loss medication?  no  For female patients: have you had a hysterectomy?  no                                     are you post menopausal?       no                                            do you still have your menstrual cycle? no      Do you take any blood-thinning medications such as: (aspirin, warfarin, Plavix, Aggrenox)  no  If yes we need the name, milligram, dosage and who is prescribing doctor  Current Outpatient Medications on File Prior to Visit  Medication Sig Dispense Refill   amLODipine (NORVASC) 5 MG tablet TAKE 1 TABLET EVERY DAY 90 tablet 3   rosuvastatin (CRESTOR) 10 MG tablet Take 1 tablet (10 mg total) by mouth daily. 90 tablet 1   CINNAMON PO Take by mouth. As needed (Patient not taking: Reported on 10/20/2023)     cyanocobalamin (VITAMIN B12) 1000 MCG tablet Take 1 tablet (1,000 mcg total) by mouth daily. (Patient not taking: Reported on 10/20/2023) 90 tablet 1   ezetimibe (ZETIA) 10 MG tablet Take 1 tablet (10 mg total) by mouth daily. (Patient not taking: Reported on 09/21/2023) 90 tablet 3   GARLIC-LECITHIN PO Take by mouth. (Patient  not taking: Reported on 10/20/2023)     Ginger, Zingiber officinalis, (GINGER PO) Take by mouth. As needed (Patient not taking: Reported on 10/20/2023)     Magnesium 200 MG TABS Take by mouth. (Patient not taking: Reported on 10/20/2023)     mirabegron ER (MYRBETRIQ) 25 MG TB24 tablet Take 1 tablet (25 mg total) by mouth daily. (Patient not taking: Reported on 08/24/2023) 30 tablet 1   Misc Natural Products (GINKOGIN PO) Take by mouth.     Multiple Vitamins-Minerals (ONE-A-DAY WOMENS PO) Take by mouth. As needed (Patient not taking: Reported on 10/20/2023)     Omega 3 1000 MG CAPS Take 1,000 mg by mouth at bedtime.     omega-3 acid ethyl esters (LOVAZA) 1 g capsule Take 1 capsule (1 g total) by mouth 2 (two) times daily. (Patient not taking: Reported on 09/30/2023) 90 capsule 1   UNABLE TO FIND Med Name:  Celery seed tab 1,000mg . Takes once weekly. (Patient not taking: Reported on 10/20/2023)     Vitamin D, Ergocalciferol, (DRISDOL) 1.25 MG (50000 UNIT) CAPS capsule Take 1 capsule (50,000 Units total) by mouth every 7 (seven) days. (Patient not taking: Reported on 10/20/2023) 10 capsule 2   No current facility-administered medications on file prior to visit.    Allergies  Allergen Reactions   Shrimp (Diagnostic) Shortness Of Breath and Itching   Other Other (See Comments)    Nuts - pt states sometimes makes her gout flare up   Hydrocodone     Makes her feel bad   Naproxen     Makes her feel bad      Pharmacy: [facility]  Primary Insurance Name: [facility]  Best number where you can be reached: [phone]

## 2023-10-20 NOTE — Telephone Encounter (Signed)
Room 1 Thanks

## 2023-10-21 NOTE — Telephone Encounter (Signed)
Left message to return call 

## 2023-10-25 ENCOUNTER — Other Ambulatory Visit: Payer: Self-pay | Admitting: Family Medicine

## 2023-11-09 NOTE — Telephone Encounter (Signed)
Left message to return call 

## 2023-11-11 NOTE — Telephone Encounter (Signed)
Unable to reach pt. will send letter

## 2023-11-17 NOTE — Telephone Encounter (Signed)
Pt left voicemail returning call. Returned call to patient but left message to return call

## 2023-11-18 ENCOUNTER — Ambulatory Visit: Payer: Medicare HMO | Admitting: Obstetrics & Gynecology

## 2023-11-18 ENCOUNTER — Encounter: Payer: Self-pay | Admitting: Obstetrics & Gynecology

## 2023-11-18 VITALS — BP 122/87 | HR 69 | Ht 64.0 in | Wt 181.0 lb

## 2023-11-18 DIAGNOSIS — N814 Uterovaginal prolapse, unspecified: Secondary | ICD-10-CM

## 2023-11-18 DIAGNOSIS — Z4689 Encounter for fitting and adjustment of other specified devices: Secondary | ICD-10-CM

## 2023-11-18 NOTE — Progress Notes (Signed)
Chief Complaint  Patient presents with   Pessary Check    Having discharge    Blood pressure 122/87, pulse 69, height 5\' 4"  (1.626 m), weight 181 lb (82.1 kg).  Megan [name] presents today for routine follow up related to her pessary.   She uses a Milex ring with syupport #3 She reports no vaginal discharge and no vaginal bleeding   Likert scale(1 not bothersome -5 very bothersome)  :  1  Exam reveals no undue vaginal mucosal pressure of breakdown, little discharge and no vaginal bleeding.  Vaginal Epithelial Abnormality Classification System:   0 0    No abnormalities 1    Epithelial erythema 2    Granulation tissue 3    Epithelial break or erosion, 1 cm or less 4    Epithelial break or erosion, 1 cm or greater  The pessary is removed, cleaned and replaced without difficulty.      ICD-10-CM   1. Pessary maintenance: Milex ring with support #3, original placement 08/24/23  Z46.89     2. Uterine prolapse, grade 2  N81.4     3. 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  [name], MD  11/18/2023 11:52 AM

## 2023-12-17 ENCOUNTER — Encounter: Payer: Self-pay | Admitting: Obstetrics & Gynecology

## 2023-12-17 ENCOUNTER — Ambulatory Visit: Payer: Self-pay | Admitting: Family Medicine

## 2023-12-17 ENCOUNTER — Ambulatory Visit: Payer: Medicare Other | Admitting: Obstetrics & Gynecology

## 2023-12-17 ENCOUNTER — Telehealth: Payer: Self-pay

## 2023-12-17 VITALS — BP 131/75 | HR 83 | Ht 64.2 in | Wt 182.2 lb

## 2023-12-17 DIAGNOSIS — N814 Uterovaginal prolapse, unspecified: Secondary | ICD-10-CM

## 2023-12-17 DIAGNOSIS — Z4689 Encounter for fitting and adjustment of other specified devices: Secondary | ICD-10-CM | POA: Diagnosis not present

## 2023-12-17 NOTE — Telephone Encounter (Signed)
Copied from CRM [id number]. Topic: General - Other >> Dec 17, 2023 12:19 PM Shelah Lewandowsky wrote: Reason for CRM: Message for office, got it taken care of, she is all set.  Did not know terms and said it was something to do with ob/gyn.

## 2023-12-17 NOTE — Telephone Encounter (Signed)
Attempted to call pt back, no answer. LVM for pt to call back re pt's msg

## 2023-12-17 NOTE — Progress Notes (Signed)
   GYN VISIT Patient name: Megan [name] MRN [id number]  Date of birth: [birth year] Chief Complaint:   pessary removal (discharge)  History of Present Illness:   Megan [name] is a 73 y.o. G3P0 PM female being seen today for the following concerns  Pt with known pessary that has been in place since Sept/Oct- placed with Dr. Despina Hidden.  Initially, it seemed to be ok, but now she desires to have it removed.  She notes slight discharge, no vaginal odor or bleeding.  Just feels uncomfortable with the device in place.  Also noted headaches- discussed with pt that pessary is unlikely to be the cause of her headache.   No LMP recorded. Patient is postmenopausal.    Review of Systems:   Pertinent items are noted in HPI Denies fever/chills, dizziness, headaches, visual disturbances, fatigue, shortness of breath, chest pain, abdominal pain, vomiting. Pertinent History Reviewed:   Past Surgical History:  Procedure Laterality Date   BILATERAL KNEE ARTHROSCOPY     COLONOSCOPY N/A 07/30/2014   Procedure: COLONOSCOPY;  Surgeon: West Bali, MD;  Location: [facility];  Service: Endoscopy;  Laterality: N/A;  9:30 AM   high cholesterol     Right knee osteotomy      Past Medical History:  Diagnosis Date   Gout    High cholesterol    Hypertension    Kidney stone    Leukopenia 05/04/2016   Rheumatoid arthritis (HCC)    Seasonal allergies    Thyroid disease    Reviewed problem list, medications and allergies. Physical Assessment:   Vitals:   12/17/23 1131  BP: 131/75  Pulse: 83  Weight: 182 lb 3.2 oz (82.6 kg)  Height: 5' 4.2" (1.631 m)  Body mass index is 31.08 kg/m.       Physical Examination:   General appearance: alert, well appearing, and in no distress  Psych: mood appropriate, normal affect  Skin: warm & dry   Cardiovascular: normal heart rate noted  Respiratory: normal respiratory effort, no distress  Pelvic: VULVA: normal appearing vulva with no masses, tenderness or  lesions, VAGINA: normal appearing vagina with normal color and discharge, no lesions.  Pessary removed without complications  Extremities: no edema   Chaperone:  pt declined     Assessment & Plan:  1) Pessary maintenance -device removed without complications -reviewed pros/cons of pessary and advised pt to f/u should prolapse become a significant issue -f/u in 43yr for annual   No orders of the defined types were placed in this encounter.   Return for NOv 2025, Annual.   Myna Hidalgo, DO Attending Obstetrician & Gynecologist, [facility] for [facility], [facility]

## 2023-12-17 NOTE — Telephone Encounter (Signed)
  Chief Complaint: vaginal discharge Symptoms: green discharge, odor Frequency: 1 week Pertinent Negatives: Patient denies fever, abdominal pain Disposition: [] ED /[] Urgent Care (no appt availability in office) / [] Appointment(In office/virtual)/ []  [facility]/ [] Home Care/ [x] Refused Recommended Disposition /[]  Mobile Bus/ []  Follow-up with PCP Additional Notes: Patient called reporting green vaginal discharge and fishy odor x 1 week. Patient reports that she has a vaginal wheel in place from OBGYN and has had complications with it. She states that she has not reached out to OBGYN with her concern. Denies fever, abdominal pain.. Per protocol, patient to be evaluated within 3 days, patient declined dispo and is requesting call back to see if provider can remove wheel. States she is going to contact OBGYN now as well. Care advice reviewed with patient, understanding verbalized. Denies further questions at this time. Alerting PCP for review.    Copied from CRM [id number]. Topic: Clinical - Red Word Triage >> Dec 17, 2023  9:33 AM Maxwell Marion wrote: Red Word that prompted transfer to Nurse Triage: green discharge in the private area with a smell, started last week. obgyn put a wheel in her after uterus dropped, the last time he put it back in was before Christmas Reason for Disposition  Bad smelling vaginal discharge  Answer Assessment - Initial Assessment Questions 1. DISCHARGE: "Describe the discharge." (e.g., white, yellow, green, gray, foamy, cottage cheese-like)     Green discharge one day, mild 2. ODOR: "Is there a bad odor?"     Yes, described as fishy 3. ONSET: "When did the discharge begin?"     1 week 4. RASH: "Is there a rash in the genital area?" If Yes, ask: "Describe it." (e.g., redness, blisters, sores, bumps)     Denies 5. ABDOMEN PAIN: "Are you having any abdomen pain?" If Yes, ask: "What does it feel like? " (e.g., crampy, dull, intermittent, constant)       Denies 6. ABDOMEN PAIN SEVERITY: If present, ask: "How bad is it?" (e.g., Scale 1-10; mild, moderate, or severe)   - MILD (1-3): Doesn't interfere with normal activities, abdomen soft and not tender to touch.    - MODERATE (4-7): Interferes with normal activities or awakens from sleep, abdomen tender to touch.    - SEVERE (8-10): Excruciating pain, doubled over, unable to do any normal activities. (R/O peritonitis)      Denies 7. CAUSE: "What do you think is causing the discharge?" "Have you had the same problem before? What happened then?"    "Vaginal wheel"  8. OTHER SYMPTOMS: "Do you have any other symptoms?" (e.g., fever, itching, vaginal bleeding, pain with urination, injury to genital area, vaginal foreign body)     States she has a vaginal wheel  Protocols used: Vaginal Discharge-A-AH

## 2024-01-04 ENCOUNTER — Encounter: Payer: Self-pay | Admitting: Family Medicine

## 2024-01-04 ENCOUNTER — Ambulatory Visit (INDEPENDENT_AMBULATORY_CARE_PROVIDER_SITE_OTHER): Payer: Medicare Other | Admitting: Family Medicine

## 2024-01-04 VITALS — BP 130/80 | HR 80 | Temp 97.8°F | Ht 64.0 in | Wt 179.0 lb

## 2024-01-04 DIAGNOSIS — S46811A Strain of other muscles, fascia and tendons at shoulder and upper arm level, right arm, initial encounter: Secondary | ICD-10-CM | POA: Diagnosis not present

## 2024-01-04 MED ORDER — METHOCARBAMOL 750 MG PO TABS: 750.0000 mg | ORAL_TABLET | Freq: Three times a day (TID) | ORAL | 0 refills | Status: AC | PRN

## 2024-01-04 NOTE — Progress Notes (Signed)
 Subjective:  HPI: Megan [name] is a 73 y.o. female presenting on 01/04/2024 for Acute Visit (arthritis attack started last Saturday)   HPI Patient is in today for neck pain since Saturday described as arthritis attack. She has a PMH of rheumatoid arthritis and gout. Described as 10/10 continuous sharp and aching pain in her right neck as well as some pain in her shoulders and elbows. The pain is worse when rotating her neck and lying down, she has been working in her shed.  Denies swelling, warmth, redness, arm weakness, numbness, or tingling Has tried Ibuprofen and position changes This has happened in the past and is relieved by steroids for her joint pain  Review of Systems  All other systems reviewed and are negative.   Relevant past medical history reviewed and updated as indicated.   Past Medical History:  Diagnosis Date   Gout    High cholesterol    Hypertension    Kidney stone    Leukopenia 05/04/2016   Rheumatoid arthritis (HCC)    Seasonal allergies    Thyroid  disease      Past Surgical History:  Procedure Laterality Date   BILATERAL KNEE ARTHROSCOPY     COLONOSCOPY N/A 07/30/2014   Procedure: COLONOSCOPY;  Surgeon: Margo LITTIE Haddock, MD;  Location: [facility];  Service: Endoscopy;  Laterality: N/A;  9:30 AM   high cholesterol     Right knee osteotomy      Allergies and medications reviewed and updated.   Current Outpatient Medications:    amLODipine  (NORVASC ) 5 MG tablet, TAKE 1 TABLET EVERY DAY, Disp: 90 tablet, Rfl: 3   CINNAMON PO, Take by mouth. As needed, Disp: , Rfl:    cyanocobalamin  (VITAMIN B12) 1000 MCG tablet, Take 1 tablet (1,000 mcg total) by mouth daily., Disp: 90 tablet, Rfl: 1   ezetimibe  (ZETIA ) 10 MG tablet, Take 1 tablet (10 mg total) by mouth daily., Disp: 90 tablet, Rfl: 3   GARLIC-LECITHIN PO, Take by mouth., Disp: , Rfl:    Ginger, Zingiber officinalis, (GINGER PO), Take by mouth. As needed, Disp: , Rfl:    Magnesium 200 MG TABS,  Take by mouth., Disp: , Rfl:    methocarbamol  (ROBAXIN -750) 750 MG tablet, Take 1 tablet (750 mg total) by mouth every 8 (eight) hours as needed for up to 10 days for muscle spasms., Disp: 30 tablet, Rfl: 0   Multiple Vitamins-Minerals (ONE-A-DAY WOMENS PO), Take by mouth. As needed, Disp: , Rfl:    Omega 3 1000 MG CAPS, Take 1,000 mg by mouth at bedtime., Disp: , Rfl:    rosuvastatin  (CRESTOR ) 10 MG tablet, Take 1 tablet (10 mg total) by mouth daily., Disp: 90 tablet, Rfl: 1  Allergies  Allergen Reactions   Shrimp (Diagnostic) Shortness Of Breath and Itching   Other Other (See Comments)    Nuts - pt states sometimes makes her gout flare up   Hydrocodone      Makes her feel bad   Naproxen     Makes her feel bad     Objective:   BP 130/80   Pulse 80   Temp 97.8 F (36.6 C) (Oral)   Ht 5' 4 (1.626 m)   Wt 179 lb (81.2 kg)   SpO2 98%   BMI 30.73 kg/m      01/04/2024   12:00 PM 12/17/2023   11:31 AM 11/18/2023   11:30 AM  Vitals with BMI  Height 5' 4 5' 4.2 5' 4  Weight 179  lbs 182 lbs 3 oz 181 lbs  BMI 30.71 31.07 31.05  Systolic 130 131 877  Diastolic 80 75 87  Pulse 80 83 69     Physical Exam Vitals and nursing note reviewed.  Constitutional:      Appearance: Normal appearance. She is normal weight.  HENT:     Head: Normocephalic and atraumatic.  Musculoskeletal:     Right shoulder: Normal. No bony tenderness.     Left shoulder: Normal.     Cervical back: No bony tenderness. Pain with movement and muscular tenderness present. No spinous process tenderness. Decreased range of motion.  Skin:    General: Skin is warm and dry.  Neurological:     General: No focal deficit present.     Mental Status: She is alert and oriented to person, place, and time. Mental status is at baseline.  Psychiatric:        Mood and Affect: Mood normal.        Behavior: Behavior normal.        Thought Content: Thought content normal.        Judgment: Judgment normal.      Assessment & Plan:  Strain of right trapezius muscle, initial encounter Assessment & Plan: Location and description of pain on exam consistent with muscle strain with spasm. Will start Robaxin  PRN. Rest and limit heavy lifting. Stretching exercises and use heat for relief as well.  For joint pain continue Ibuprofen PRN. If symptoms not improved or worsen return to office.    Other orders -     Methocarbamol ; Take 1 tablet (750 mg total) by mouth every 8 (eight) hours as needed for up to 10 days for muscle spasms.  Dispense: 30 tablet; Refill: 0     Follow up plan: Return if symptoms worsen or fail to improve.  Jeoffrey GORMAN Barrio, FNP

## 2024-01-04 NOTE — Assessment & Plan Note (Addendum)
 Location and description of pain on exam consistent with muscle strain with spasm. Will start Robaxin  PRN. Rest and limit heavy lifting. Stretching exercises and use heat for relief as well.  For joint pain continue Ibuprofen PRN. If symptoms not improved or worsen return to office.

## 2024-01-06 ENCOUNTER — Ambulatory Visit: Payer: Self-pay | Admitting: Family Medicine

## 2024-01-06 ENCOUNTER — Ambulatory Visit
Admission: EM | Admit: 2024-01-06 | Discharge: 2024-01-06 | Disposition: A | Discharge status: Home / Self Care | Payer: Medicare Other | Attending: Nurse Practitioner | Admitting: Nurse Practitioner

## 2024-01-06 DIAGNOSIS — Z8739 Personal history of other diseases of the musculoskeletal system and connective tissue: Secondary | ICD-10-CM | POA: Diagnosis not present

## 2024-01-06 DIAGNOSIS — M542 Cervicalgia: Secondary | ICD-10-CM

## 2024-01-06 MED ORDER — PREDNISONE 20 MG PO TABS: 40.0000 mg | ORAL_TABLET | Freq: Every day | ORAL | 0 refills | Status: AC

## 2024-01-06 MED ORDER — DEXAMETHASONE SODIUM PHOSPHATE 10 MG/ML IJ SOLN
10.0000 mg | INTRAMUSCULAR | Status: AC
  Administered 2024-01-06: 10 mg via INTRAMUSCULAR

## 2024-01-06 NOTE — ED Provider Notes (Signed)
 [facility]    CSN: [id number] Arrival date & time: 01/06/24  1927      History   Chief Complaint Chief Complaint  Patient presents with   Neck Pain    HPI Megan [name] is a 73 y.o. female.   The history is provided by the patient.   Patient presents for complaints of neck pain that is been present for the past week.  Patient reports that she has a prior history of RA, and believes she is having a flare.  States that she did see her PCP earlier in the week, states PCP prescribed muscle relaxers, which are not helping with her symptoms.  Patient states that she has continued to take ibuprofen with minimal relief.  Patient complains of pain to the neck with all range of motion, she also states that the pain extends down into her shoulders causing her to have decreased range of motion in her shoulders and upper extremities.  Patient states pain is located on the left trapezius.  Denies injury, trauma, numbness or tingling of the upper extremities.  Past Medical History:  Diagnosis Date   Gout    High cholesterol    Hypertension    Kidney stone    Leukopenia 05/04/2016   Rheumatoid arthritis (HCC)    Seasonal allergies    Thyroid  disease     Patient Active Problem List   Diagnosis Date Noted   Strain of right trapezius muscle 01/04/2024   Pain behind the ear, left 02/26/2023   Great toe pain, right 12/22/2022   Laceration of right great toe 12/22/2022   Enuresis, nocturnal only 11/25/2022   Sinusitis, acute, maxillary 09/15/2022   Trochanteric bursitis, right hip 07/30/2022   Physical exam, annual 05/26/2022   Impaired vision in both eyes 05/26/2022   Elevated ferritin level 05/26/2022   PVC (premature ventricular contraction) 04/17/2022   Obesity (BMI 30-39.9) 01/23/2022   Irregular heart beat 08/21/2021   Acute gout of left ankle 07/15/2021   Osteoporosis 02/20/2021   GERD (gastroesophageal reflux disease) 07/14/2019   Acquired renal cyst of right kidney  07/14/2019   Rheumatoid arthritis (HCC) 05/09/2019   Arthritis of carpometacarpal (CMC) joint of right thumb 12/01/2018   Medial epicondylitis of elbow, right 12/01/2018   Hypertension 05/19/2017   DDD (degenerative disc disease), lumbar 05/19/2017   Hyperlipidemia 05/19/2017   Prediabetes 05/19/2017   Iron deficiency 05/19/2017   Hypothyroidism 05/19/2017   Leukopenia 05/04/2016   Arthritis of right knee 03/01/2012   Knee pain 02/25/2012    Past Surgical History:  Procedure Laterality Date   BILATERAL KNEE ARTHROSCOPY     COLONOSCOPY N/A 07/30/2014   Procedure: COLONOSCOPY;  Surgeon: Margo LITTIE Haddock, MD;  Location: [facility];  Service: Endoscopy;  Laterality: N/A;  9:30 AM   high cholesterol     Right knee osteotomy      OB History     Gravida  3   Para      Term      Preterm      AB      Living  3      SAB      IAB      Ectopic      Multiple      Live Births               Home Medications    Prior to Admission medications   Medication Sig Start Date End Date Taking? Authorizing Provider  predniSONE  (DELTASONE )  20 MG tablet Take 2 tablets (40 mg total) by mouth daily with breakfast for 5 days. 01/06/24 01/11/24 Yes Leath-Warren, Etta PARAS, NP  amLODipine  (NORVASC ) 5 MG tablet TAKE 1 TABLET EVERY DAY 08/18/23   Zarwolo, Gloria, FNP  CINNAMON PO Take by mouth. As needed    [provider]  cyanocobalamin  (VITAMIN B12) 1000 MCG tablet Take 1 tablet (1,000 mcg total) by mouth daily. 02/25/23   Zarwolo, Gloria, FNP  ezetimibe  (ZETIA ) 10 MG tablet Take 1 tablet (10 mg total) by mouth daily. 12/02/22   Zarwolo, Gloria, FNP  GARLIC-LECITHIN PO Take by mouth.    [provider]  Ginger, Zingiber officinalis, (GINGER PO) Take by mouth. As needed    [provider]  Magnesium 200 MG TABS Take by mouth.    [provider]  methocarbamol  (ROBAXIN -750) 750 MG tablet Take 1 tablet (750 mg total) by mouth every 8 (eight) hours as  needed for up to 10 days for muscle spasms. 01/04/24 01/14/24  Kayla Jeoffrey RAMAN, FNP  Multiple Vitamins-Minerals (ONE-A-DAY WOMENS PO) Take by mouth. As needed    [provider]  Omega 3 1000 MG CAPS Take 1,000 mg by mouth at bedtime.    [provider]  rosuvastatin  (CRESTOR ) 10 MG tablet Take 1 tablet (10 mg total) by mouth daily. 10/05/23   Kayla Jeoffrey RAMAN, FNP    Family History Family History  Problem Relation Age of Onset   Other Father        Rare blood disease   Hypertension Mother    Asthma Mother    Hypertension Brother    Cancer Maternal Uncle    Colon cancer Paternal Uncle    Bladder Cancer Neg Hx    Kidney cancer Neg Hx     Social History Social History   Tobacco Use   Smoking status: Never   Smokeless tobacco: Never  Vaping Use   Vaping status: Never Used  Substance Use Topics   Alcohol  use: No   Drug use: No    Comment: used CBD oil     Allergies   Shrimp (diagnostic), Other, Hydrocodone , and Naproxen   Review of Systems Review of Systems Per HPI  Physical Exam Triage Vital Signs ED Triage Vitals  Encounter Vitals Group     BP 01/06/24 1954 (!) 155/88     Systolic BP Percentile --      Diastolic BP Percentile --      Pulse Rate 01/06/24 1954 87     Resp 01/06/24 1954 18     Temp 01/06/24 1954 99.6 F (37.6 C)     Temp Source 01/06/24 1954 Oral     SpO2 01/06/24 1954 96 %     Weight --      Height --      Head Circumference --      Peak Flow --      Pain Score 01/06/24 1955 10     Pain Loc --      Pain Education --      Exclude from Growth Chart --    No data found.  Updated Vital Signs BP (!) 155/88 (BP Location: Right Arm)   Pulse 87   Temp 99.6 F (37.6 C) (Oral)   Resp 18   SpO2 96%   Visual Acuity Right Eye Distance:   Left Eye Distance:   Bilateral Distance:    Right Eye Near:   Left Eye Near:    Bilateral Near:  Physical Exam Vitals and nursing note reviewed.  Constitutional:      General: She  is not in acute distress.    Appearance: Normal appearance.  HENT:     Head: Normocephalic.  Eyes:     Extraocular Movements: Extraocular movements intact.     Pupils: Pupils are equal, round, and reactive to light.  Cardiovascular:     Rate and Rhythm: Regular rhythm.     Pulses: Normal pulses.     Heart sounds: Normal heart sounds.  Pulmonary:     Effort: Pulmonary effort is normal. No respiratory distress.     Breath sounds: Normal breath sounds. No stridor. No wheezing, rhonchi or rales.  Abdominal:     General: Bowel sounds are normal.     Palpations: Abdomen is soft.     Tenderness: There is no abdominal tenderness.  Musculoskeletal:     Left shoulder: Decreased range of motion.     Cervical back: No erythema or signs of trauma. Pain with movement and muscular tenderness present. Decreased range of motion.  Lymphadenopathy:     Cervical: No cervical adenopathy.  Skin:    General: Skin is warm and dry.  Neurological:     General: No focal deficit present.     Mental Status: She is alert and oriented to person, place, and time.  Psychiatric:        Mood and Affect: Mood normal.        Behavior: Behavior normal.      UC Treatments / Results  Labs (all labs ordered are listed, but only abnormal results are displayed) Labs Reviewed - No data to display  EKG   Radiology No results found.  Procedures Procedures (including critical care time)  Medications Ordered in UC Medications  dexamethasone  (DECADRON ) injection 10 mg (10 mg Intramuscular Given 01/06/24 2013)    Initial Impression / Assessment and Plan / UC Course  I have reviewed the triage vital signs and the nursing notes.  Pertinent labs & imaging results that were available during my care of the patient were reviewed by me and considered in my medical decision making (see chart for details).  Decadron  10 mg IM administered for inflammation.  Will start patient on prednisone  40 mg daily for the next 5  days.  Symptoms are consistent with RA.  Patient was advised to contact her PCP to advise that muscle relaxer has not been helpful for her pain.  Supportive care recommendations were provided and discussed with the patient to include over-the-counter analgesics, the use of ice or heat, and gentle range of motion exercises.  Patient was advised to follow-up with rheumatology for further evaluation.  Patient was in agreement with this plan of care and verbalizes understanding.  All questions were answered.  Patient stable for discharge.  Final Clinical Impressions(s) / UC Diagnoses   Final diagnoses:  Neck pain  History of rheumatoid arthritis     Discharge Instructions      You have been given an injection of Decadron  10 mg.  Do not take any additional NSAIDs this evening to include ibuprofen, Aleve, Advil, or naproxen.  Recommend over-the-counter Tylenol  for breakthrough pain or discomfort. Apply ice or heat as needed.  Apply ice for pain or swelling, heat for spasm or stiffness.  Apply for 20 minutes, remove for 1 hour, repeat as needed. Gentle stretching exercises to help with neck pain or discomfort. As discussed, please follow-up with your PCP office to advise that muscle relaxer is not helping,  and to determine if follow-up with rheumatologist is needed. Follow-up as needed.     ED Prescriptions     Medication Sig Dispense Auth. Provider   predniSONE  (DELTASONE ) 20 MG tablet Take 2 tablets (40 mg total) by mouth daily with breakfast for 5 days. 10 tablet Leath-Warren, Etta PARAS, NP      PDMP not reviewed this encounter.   Gilmer Etta PARAS, NP 01/06/24 2016

## 2024-01-06 NOTE — ED Triage Notes (Signed)
 Pt reports she is having a RA flare up at the left side of her neck x 6 days

## 2024-01-06 NOTE — Telephone Encounter (Signed)
 Chief Complaint: Neck pain Symptoms: Neck pain  Frequency: 1 week Pertinent Negatives: Patient denies redness, swelling, numbness tingling of arms or legs Disposition: [] ED /[x] Urgent Care (no appt availability in office) / [] Appointment(In office/virtual)/ []  [facility]/ [] Home Care/ [] Refused Recommended Disposition /[]  Mobile Bus/ []  Follow-up with PCP Additional Notes: Patient called in stating she is still experiencing severe neck pain. Patient was seen in office on 02/04 and prescribed muscle relaxers PRN. Patient states she has been taking these and ibuprofen but not having relief from the neck pain and asking if she should go to urgent care so she can receive some prednisone . Patient believes neck pain to be related to inflammation and Rheumatoid Arthritis. Patient rating neck pain 10/10. This RN advised patient to be seen in UC tonight for further evaluation. Patient agreed and verbalized understanding.   Copied from CRM [id number]. Topic: Clinical - Red Word Triage >> Jan 06, 2024  4:29 PM Elle L wrote: Red Word that prompted transfer to Nurse Triage: The patient states that she has severe pain that shifted from her right side to the left side. She was prescribed at methocarbamol  (ROBAXIN -750) 750 MG tablet at her appointment on 2/4 but it is not helping. She believes it may be her Rheumatoid Arthritis. Reason for Disposition  [1] SEVERE neck pain (e.g., excruciating, unable to do any normal activities) AND [2] not improved after 2 hours of pain medicine  Answer Assessment - Initial Assessment Questions 1. ONSET: When did the pain begin?      A week 2. LOCATION: Where does it hurt?      Started on right side of neck and moved to left side of neck 3. PATTERN Does the pain come and go, or has it been constant since it started?      Constant 4. SEVERITY: How bad is the pain?  (Scale 1-10; or mild, moderate, severe)   - NO PAIN (0): no pain or only slight  stiffness    - MILD (1-3): doesn't interfere with normal activities    - MODERATE (4-7): interferes with normal activities or awakens from sleep    - SEVERE (8-10):  excruciating pain, unable to do any normal activities      10 5. RADIATION: Does the pain go anywhere else, shoot into your arms?     No 6. CORD SYMPTOMS: Any weakness or numbness of the arms or legs?     No 7. CAUSE: What do you think is causing the neck pain?     Rheumatoid Arthritis & inflammation 8. NECK OVERUSE: Any recent activities that involved turning or twisting the neck?     No  9. OTHER SYMPTOMS: Do you have any other symptoms? (e.g., headache, fever, chest pain, difficulty breathing, neck swelling)     No  Protocols used: Neck Pain or Stiffness-A-AH

## 2024-01-06 NOTE — Discharge Instructions (Signed)
 You have been given an injection of Decadron  10 mg.  Do not take any additional NSAIDs this evening to include ibuprofen, Aleve, Advil, or naproxen.  Recommend over-the-counter Tylenol  for breakthrough pain or discomfort. Apply ice or heat as needed.  Apply ice for pain or swelling, heat for spasm or stiffness.  Apply for 20 minutes, remove for 1 hour, repeat as needed. Gentle stretching exercises to help with neck pain or discomfort. As discussed, please follow-up with your PCP office to advise that muscle relaxer is not helping, and to determine if follow-up with rheumatologist is needed. Follow-up as needed.

## 2024-01-14 ENCOUNTER — Ambulatory Visit (INDEPENDENT_AMBULATORY_CARE_PROVIDER_SITE_OTHER): Payer: Medicare Other | Admitting: Family Medicine

## 2024-01-14 VITALS — BP 120/64 | HR 87 | Temp 99.6°F | Ht 64.0 in | Wt 182.6 lb

## 2024-01-14 DIAGNOSIS — R509 Fever, unspecified: Secondary | ICD-10-CM | POA: Diagnosis not present

## 2024-01-14 LAB — INFLUENZA A AND B AG, IMMUNOASSAY
INFLUENZA A ANTIGEN: DETECTED — AB
INFLUENZA B ANTIGEN: NOT DETECTED

## 2024-01-14 MED ORDER — OSELTAMIVIR PHOSPHATE 75 MG PO CAPS: 75.0000 mg | ORAL_CAPSULE | Freq: Two times a day (BID) | ORAL | 0 refills | Status: DC

## 2024-01-14 NOTE — Progress Notes (Signed)
Acute Office Visit  Subjective:     Patient ID: Megan [name], female    DOB: 1951-07-01, 73 y.o.   MRN: [id number]  Chief Complaint  Patient presents with   Cough    Pt c/o cough and congestion x 1 week. No fever. Pt c/o fatigue.     Cough   Patient is in today for head congestion, rhinorrhea, and cough x 1 week.  She reports fevers.  She reports sinus pressure and sinus pain.  She denies any shortness of breath.  She does have chest congestion and frequent coughing.  She denies any pleurisy.  She denies any purulent sputum.  She denies any nausea or vomiting.  She does have headaches in her frontal sinus area. Past Medical History:  Diagnosis Date   Gout    High cholesterol    Hypertension    Kidney stone    Leukopenia 05/04/2016   Rheumatoid arthritis (HCC)    Seasonal allergies    Thyroid disease    Past Surgical History:  Procedure Laterality Date   BILATERAL KNEE ARTHROSCOPY     COLONOSCOPY N/A 07/30/2014   Procedure: COLONOSCOPY;  Surgeon: West Bali, MD;  Location: [facility];  Service: Endoscopy;  Laterality: N/A;  9:30 AM   high cholesterol     Right knee osteotomy     Current Outpatient Medications on File Prior to Visit  Medication Sig Dispense Refill   amLODipine (NORVASC) 5 MG tablet TAKE 1 TABLET EVERY DAY 90 tablet 3   CINNAMON PO Take by mouth. As needed     cyanocobalamin (VITAMIN B12) 1000 MCG tablet Take 1 tablet (1,000 mcg total) by mouth daily. 90 tablet 1   ezetimibe (ZETIA) 10 MG tablet Take 1 tablet (10 mg total) by mouth daily. 90 tablet 3   GARLIC-LECITHIN PO Take by mouth.     Ginger, Zingiber officinalis, (GINGER PO) Take by mouth. As needed     Magnesium 200 MG TABS Take by mouth.     methocarbamol (ROBAXIN-750) 750 MG tablet Take 1 tablet (750 mg total) by mouth every 8 (eight) hours as needed for up to 10 days for muscle spasms. 30 tablet 0   Multiple Vitamins-Minerals (ONE-A-DAY WOMENS PO) Take by mouth. As needed     Omega 3  1000 MG CAPS Take 1,000 mg by mouth at bedtime.     rosuvastatin (CRESTOR) 10 MG tablet Take 1 tablet (10 mg total) by mouth daily. 90 tablet 1   No current facility-administered medications on file prior to visit.   Allergies  Allergen Reactions   Shrimp (Diagnostic) Shortness Of Breath and Itching   Other Other (See Comments)    Nuts - pt states sometimes makes her gout flare up   Hydrocodone     Makes her feel bad   Naproxen     Makes her feel bad      Review of Systems  Respiratory:  Positive for cough.   All other systems reviewed and are negative.       Objective:    BP 120/64   Pulse 87   Temp 99.6 F (37.6 C)   Ht 5\' 4"  (1.626 m)   Wt 182 lb 9.6 oz (82.8 kg)   SpO2 97%   BMI 31.34 kg/m    Physical Exam Vitals reviewed.  Constitutional:      General: She is not in acute distress.    Appearance: Normal appearance. She is normal weight. She is not ill-appearing  or toxic-appearing.  HENT:     Right Ear: Tympanic membrane and ear canal normal.     Left Ear: Tympanic membrane and ear canal normal.     Nose: Congestion and rhinorrhea present.     Right Sinus: Frontal sinus tenderness present. No maxillary sinus tenderness.     Left Sinus: Frontal sinus tenderness present. No maxillary sinus tenderness.     Mouth/Throat:     Pharynx: Oropharynx is clear. No oropharyngeal exudate or posterior oropharyngeal erythema.  Eyes:     Conjunctiva/sclera: Conjunctivae normal.  Cardiovascular:     Rate and Rhythm: Normal rate and regular rhythm.     Pulses: Normal pulses.     Heart sounds: Normal heart sounds. No murmur heard.    No friction rub. No gallop.  Pulmonary:     Effort: Pulmonary effort is normal. No respiratory distress.     Breath sounds: Normal breath sounds. No wheezing, rhonchi or rales.  Abdominal:     General: Bowel sounds are normal.     Palpations: Abdomen is soft.     Tenderness: Guarding: .diag.  Musculoskeletal:     Cervical back: Normal  range of motion.  Lymphadenopathy:     Cervical: No cervical adenopathy.  Neurological:     Mental Status: She is alert.        Assessment & Plan:  Fever, unspecified fever cause - Plan: Influenza A and B Ag, Immunoassay I suspect the patient has a viral upper respiratory infection.  I will screen the patient for influenza.  She may also be developing a sinus infection based on the pain and pressure she is experiencing in her frontal sinuses.  Patient tested positive for type a flu.  Despite the time range I will start her on Tamiflu 75 mg twice daily for 5 days.  Recommended supportive care.

## 2024-01-17 ENCOUNTER — Ambulatory Visit: Payer: Self-pay | Admitting: Family Medicine

## 2024-01-17 ENCOUNTER — Other Ambulatory Visit: Payer: Self-pay | Admitting: Family Medicine

## 2024-01-17 NOTE — Telephone Encounter (Signed)
Copied from CRM [id number]. Topic: Clinical - Medical Advice >> Jan 17, 2024  4:35 PM Higinio Roger wrote: Reason for CRM: Patient would like callback to see what she can take, due to losing her oseltamivir (TAMIFLU) 75 MG capsule. She would like to know if Theraflu was okay until her TAMIFLU is refilled. Callback #: [phone] Answer Assessment - Initial Assessment Questions 1. REASON FOR CALL or QUESTION: "What is your reason for calling today?" or "How can I best help you?" or "What question do you have that I can help answer?"     Patient asking if she can take theraflu to treat symptoms while Tamiflu is being refilled, due to losing Tamiflu medication. This RN advised patient can take Theraflu for symptom management. Patient verbalized understanding. No further questions at this time.  Protocols used: Information Only Call - No Triage-A-AH

## 2024-01-17 NOTE — Telephone Encounter (Signed)
Requested medication (s) are due for refill today: No  Requested medication (s) are on the active medication list: Yes  Last refill:  01/14/24  Future visit scheduled: No  Notes to clinic:  Unable to refill due to no refill protocol for this medication.      Requested Prescriptions  Pending Prescriptions Disp Refills   oseltamivir (TAMIFLU) 75 MG capsule [Pharmacy Med Name: Oseltamivir Phosphate 75 MG Oral Capsule] 10 capsule 0    Sig: Take 1 capsule by mouth twice daily     Off-Protocol Failed - 01/17/2024  4:37 PM      Failed - Medication not assigned to a protocol, review manually.      Failed - Valid encounter within last 12 months    Recent Outpatient Visits           2 years ago Pure hypercholesterolemia   [facility] Valentino Nose, NP   2 years ago Routine general medical examination at a health care facility   [facility] [place], Velna Hatchet, MD   3 years ago Hypothyroidism, unspecified type   [facility] [place], Velna Hatchet, MD   3 years ago Rheumatoid arthritis with positive rheumatoid factor, involving unspecified site [facility])   [facility] [place], Velna Hatchet, MD   3 years ago Essential hypertension   [facility] [place], Velna Hatchet, MD

## 2024-01-17 NOTE — Telephone Encounter (Signed)
Copied from CRM [id number]. Topic: Clinical - Medication Refill >> Jan 17, 2024  4:30 PM Higinio Roger wrote: Most Recent Primary Care Visit:  Provider: Lynnea Ferrier T  Department: [facility]  Visit Type: ACUTE  Date: 01/14/2024  Medication: oseltamivir (TAMIFLU) 75 MG capsule  Has the patient contacted their pharmacy? No (Agent: If no, request that the patient contact the pharmacy for the refill. If patient does not wish to contact the pharmacy document the reason why and proceed with request.)  Patient states she may have accidentally threw them away while taking care of her husband. She would like Dr. Tanya Nones to refill the Rx. (Agent: If yes, when and what did the pharmacy advise?)  Is this the correct pharmacy for this prescription? Yes If no, delete pharmacy and type the correct one.  This is the patient's preferred pharmacy:   [facility] [address] [place] - [address] [place] [address] [place] [address] Phone: [phone] Fax: [phone]   Has the prescription been filled recently? Yes  Is the patient out of the medication? Yes  Has the patient been seen for an appointment in the last year OR does the patient have an upcoming appointment? Yes  Can we respond through MyChart? No. Patient would like a callback at [phone]  Agent: Please be advised that Rx refills may take up to 3 business days. We ask that you follow-up with your pharmacy.

## 2024-04-05 ENCOUNTER — Telehealth: Payer: Self-pay

## 2024-04-05 NOTE — Telephone Encounter (Signed)
 Copied from CRM [id number]. Topic: Referral - Question >> Apr 04, 2024  4:40 PM Ethelle Herb L wrote: Reason for CRM: Patient states she was referred to Gastroenterologist for colonoscopy. Patient states she did get in contact with them and even went in person to office. Patient was told they would call her back, patient missed call and called back. Patient has not heard back from them since. Patient requesting this to be looked into.   Best call number: [phone]

## 2024-04-12 ENCOUNTER — Telehealth: Payer: Self-pay | Admitting: Family Medicine

## 2024-04-12 NOTE — Telephone Encounter (Signed)
 Patient came by the office asking will Megan [name] consider taking her back on as a patient it is closer for her to drive to [place] area. Please advise patient [phone] can leave a detail message.

## 2024-04-13 NOTE — Telephone Encounter (Signed)
 Patient was not dismissed from practice. Patient can continue seeing any provider at [facility].

## 2024-04-13 NOTE — Telephone Encounter (Signed)
Patient aware, appt scheduled.  

## 2024-04-20 ENCOUNTER — Ambulatory Visit (INDEPENDENT_AMBULATORY_CARE_PROVIDER_SITE_OTHER): Admitting: Family Medicine

## 2024-04-20 ENCOUNTER — Encounter: Payer: Self-pay | Admitting: Family Medicine

## 2024-04-20 VITALS — BP 125/77 | HR 68 | Resp 16 | Ht 64.0 in | Wt 182.0 lb

## 2024-04-20 DIAGNOSIS — M51369 Other intervertebral disc degeneration, lumbar region without mention of lumbar back pain or lower extremity pain: Secondary | ICD-10-CM | POA: Diagnosis not present

## 2024-04-20 DIAGNOSIS — R7301 Impaired fasting glucose: Secondary | ICD-10-CM | POA: Diagnosis not present

## 2024-04-20 DIAGNOSIS — E7849 Other hyperlipidemia: Secondary | ICD-10-CM

## 2024-04-20 DIAGNOSIS — E038 Other specified hypothyroidism: Secondary | ICD-10-CM

## 2024-04-20 DIAGNOSIS — I10 Essential (primary) hypertension: Secondary | ICD-10-CM | POA: Diagnosis not present

## 2024-04-20 DIAGNOSIS — E559 Vitamin D deficiency, unspecified: Secondary | ICD-10-CM | POA: Diagnosis not present

## 2024-04-20 MED ORDER — CYCLOBENZAPRINE HCL 5 MG PO TABS: 5.0000 mg | ORAL_TABLET | Freq: Three times a day (TID) | ORAL | 1 refills | Status: DC | PRN

## 2024-04-20 MED ORDER — CYCLOBENZAPRINE HCL 5 MG PO TABS: 5.0000 mg | ORAL_TABLET | Freq: Every day | ORAL | 1 refills | Status: DC | PRN

## 2024-04-20 NOTE — Assessment & Plan Note (Signed)
 Controlled She takes amlodipine  5 mg daily She denies headaches, dizziness, blurred vision With a CBC and CMP today Encouraged to continue treatment regimen BP Readings from Last 3 Encounters:  04/20/24 125/77  01/14/24 120/64  01/06/24 (!) 155/88

## 2024-04-20 NOTE — Patient Instructions (Addendum)
 I appreciate the opportunity to provide care to you today!    Follow up:  4 months  Labs: please stop by the lab today to get your blood drawn (CBC, CMP, TSH, Lipid profile, HgA1c, Vit D)  Please schedule medicare annual wellness visit  Left Lower Back Pain: Please continue taking over-the-counter ibuprofen as needed for pain relief.  Start taking Flexeril (cyclobenzaprine) 5 mg at bedtime to help with muscle spasms and improve sleep quality.  The patient may also apply topical Voltaren (diclofenac) gel to the affected area for additional relief. Supportive Measures and Activity Recommendations: -Apply heat to the lower back in the morning and ice in the evening as needed. -Engage in gentle stretching and low-impact exercises such as walking or yoga to promote mobility and reduce stiffness. -Maintain good posture and utilize ergonomic support during prolonged sitting. -Avoid heavy lifting or movements that exacerbate pain. Follow up if symptoms persist, worsen, or interfere with daily functioning.  Lifestyle Modifications: -Encourage weight management and regular physical activity to support spine health. -Assess and address any sleep-related factors (e.g., mattress quality, sleeping position).    Please continue to a heart-healthy diet and increase your physical activities. Try to exercise for at least five days a week.    It was a pleasure to see you and I look forward to continuing to work together on your health and well-being. Please do not hesitate to call the office if you need care or have questions about your care.  In case of emergency, please visit the Emergency Department for urgent care, or contact our clinic at [phone] to schedule an appointment. We're here to help you!   Have a wonderful day and week. With Gratitude, Yahel Fuston MSN, FNP-BC

## 2024-04-20 NOTE — Assessment & Plan Note (Signed)
 I reviewed the lumbar spine imaging study completed on June 08, 2011, which revealed mild degenerative disc and facet disease of the lumbar spine. The patient presents today with stiffness and lower back pain, which she rates as 4 out of 10. The pain is relieved with over-the-counter ibuprofen (Motrin). She also reports that vitamin D  complex supplementation provides symptom relief. The pain does not radiate down the lower extremities. She denies any recent trauma or injury, and no red flag symptoms (e.g., bowel/bladder dysfunction, numbness, weakness, unexplained weight loss) are reported.  Plan: Continue current over-the-counter Motrin as needed for pain A prescription for Flexeril (cyclobenzaprine) has been sent to the pharmacy for use at bedtime to relieve muscle stiffness Recommend nonpharmacological interventions, including: Heat applications to the lower back Avoidance of prolonged sitting or standing Gentle stretching and strengthening exercises Adequate rest and ergonomic support

## 2024-04-20 NOTE — Progress Notes (Signed)
 Established Patient Office Visit  Subjective:  Patient ID: Megan [name], female    DOB: 11-28-1951  Age: 73 y.o. MRN: [id number]  CC:  Chief Complaint  Patient presents with   Back Pain    Left sided back pain that sometimes will travel to her right side. Very painful and stiff in the morning     HPI Megan [name] is a 73 y.o. female with past medical history of hypertension, hyperlipidemia, prediabetes and hypothyroidism presents for f/u of  chronic medical conditions.  For the details of today's visit, please refer to the assessment and plan.     Past Medical History:  Diagnosis Date   Gout    High cholesterol    Hypertension    Kidney stone    Leukopenia 05/04/2016   Rheumatoid arthritis (HCC)    Seasonal allergies    Thyroid  disease     Past Surgical History:  Procedure Laterality Date   BILATERAL KNEE ARTHROSCOPY     COLONOSCOPY N/A 07/30/2014   Procedure: COLONOSCOPY;  Surgeon: Alyce Jubilee, MD;  Location: [facility];  Service: Endoscopy;  Laterality: N/A;  9:30 AM   high cholesterol     Right knee osteotomy      Family History  Problem Relation Age of Onset   Other Father        Rare blood disease   Hypertension Mother    Asthma Mother    Hypertension Brother    Cancer Maternal Uncle    Colon cancer Paternal Uncle    Bladder Cancer Neg Hx    Kidney cancer Neg Hx     Social History   Socioeconomic History   Marital status: Married    Spouse name: Not on file   Number of children: 4   Years of education: Not on file   Highest education level: Not on file  Occupational History   Occupation: Retired; Works PT in [place] [employer] at [employer] in [place]  Tobacco Use   Smoking status: Never   Smokeless tobacco: Never  Vaping Use   Vaping status: Never Used  Substance and Sexual Activity   Alcohol  use: No   Drug use: No    Comment: used CBD oil   Sexual activity: Not Currently    Birth control/protection: Post-menopausal  Other Topics Concern    Not on file  Social History Narrative   3 biological children and 1 adopted   Social Drivers of Corporate investment banker Strain: Low Risk  (02/25/2023)   Overall Financial Resource Strain (CARDIA)    Difficulty of Paying Living Expenses: Not hard at all  Food Insecurity: No Food Insecurity (02/25/2023)   Hunger Vital Sign    Worried About Running Out of Food in the Last Year: Never true    Ran Out of Food in the Last Year: Never true  Transportation Needs: No Transportation Needs (02/25/2023)   PRAPARE - Administrator, Civil Service (Medical): No    Lack of Transportation (Non-Medical): No  Physical Activity: Sufficiently Active (02/25/2023)   Exercise Vital Sign    Days of Exercise per Week: 3 days    Minutes of Exercise per Session: 60 min  Stress: No Stress Concern Present (02/25/2023)   [employer] of Occupational Health - Occupational Stress Questionnaire    Feeling of Stress : Not at all  Social Connections: Moderately Isolated (02/25/2023)   Social Connection and Isolation Panel [NHANES]    Frequency of Communication with Friends  and Family: More than three times a week    Frequency of Social Gatherings with Friends and Family: Once a week    Attends Religious Services: Never    Database administrator or Organizations: No    Attends Banker Meetings: Never    Marital Status: Married  Catering manager Violence: Not At Risk (02/25/2023)   Humiliation, Afraid, Rape, and Kick questionnaire    Fear of Current or Ex-Partner: No    Emotionally Abused: No    Physically Abused: No    Sexually Abused: No    Outpatient Medications Prior to Visit  Medication Sig Dispense Refill   amLODipine  (NORVASC ) 5 MG tablet TAKE 1 TABLET EVERY DAY 90 tablet 3   Calcium  Carb-Cholecalciferol  (CALCIUM  600 + D) 600-5 MG-MCG TABS Take 1 tablet by mouth daily.     CINNAMON PO Take by mouth. As needed     cyanocobalamin  (VITAMIN B12) 1000 MCG tablet Take 1 tablet  (1,000 mcg total) by mouth daily. 90 tablet 1   ezetimibe  (ZETIA ) 10 MG tablet Take 1 tablet (10 mg total) by mouth daily. 90 tablet 3   GARLIC-LECITHIN PO Take by mouth.     Ginger, Zingiber officinalis, (GINGER PO) Take by mouth. As needed     Magnesium 200 MG TABS Take by mouth.     Multiple Vitamins-Minerals (ONE-A-DAY WOMENS PO) Take by mouth. As needed     Omega 3 1000 MG CAPS Take 1,000 mg by mouth at bedtime.     rosuvastatin  (CRESTOR ) 10 MG tablet Take 1 tablet (10 mg total) by mouth daily. (Patient not taking: Reported on 04/20/2024) 90 tablet 1   oseltamivir  (TAMIFLU ) 75 MG capsule Take 1 capsule (75 mg total) by mouth 2 (two) times daily. 10 capsule 0   No facility-administered medications prior to visit.    Allergies  Allergen Reactions   Shrimp (Diagnostic) Shortness Of Breath and Itching   Other Other (See Comments)    Nuts - pt states sometimes makes her gout flare up   Hydrocodone      Makes her feel bad   Naproxen     Makes her feel bad     ROS Review of Systems  Constitutional:  Negative for chills and fever.  Eyes:  Negative for visual disturbance.  Respiratory:  Negative for chest tightness and shortness of breath.   Musculoskeletal:  Positive for back pain.  Neurological:  Negative for dizziness and headaches.      Objective:     Physical Exam HENT:     Head: Normocephalic.     Mouth/Throat:     Mouth: Mucous membranes are moist.  Cardiovascular:     Rate and Rhythm: Normal rate.     Heart sounds: Normal heart sounds.  Pulmonary:     Effort: Pulmonary effort is normal.     Breath sounds: Normal breath sounds.  Musculoskeletal:     Lumbar back: Tenderness present.  Neurological:     Mental Status: She is alert.     BP 125/77   Pulse 68   Resp 16   Ht 5\' 4"  (1.626 m)   Wt 182 lb (82.6 kg)   SpO2 95%   BMI 31.24 kg/m  Wt Readings from Last 3 Encounters:  04/20/24 182 lb (82.6 kg)  01/14/24 182 lb 9.6 oz (82.8 kg)  01/04/24 179 lb  (81.2 kg)    Lab Results  Component Value Date   TSH 1.54 09/30/2023   Lab Results  Component  Value Date   WBC 4.4 09/30/2023   HGB 12.7 09/30/2023   HCT 38.4 09/30/2023   MCV 89.1 09/30/2023   PLT 257 09/30/2023   Lab Results  Component Value Date   NA 141 09/30/2023   K 4.7 09/30/2023   CO2 29 09/30/2023   GLUCOSE 90 09/30/2023   BUN 14 09/30/2023   CREATININE 0.70 09/30/2023   BILITOT 0.4 09/30/2023   ALKPHOS 106 02/25/2023   AST 17 09/30/2023   ALT 12 09/30/2023   PROT 7.0 09/30/2023   ALBUMIN 4.4 02/25/2023   CALCIUM  10.3 09/30/2023   ANIONGAP 8 07/16/2017   EGFR 91 09/30/2023   Lab Results  Component Value Date   CHOL 259 (H) 09/30/2023   Lab Results  Component Value Date   HDL 87 09/30/2023   Lab Results  Component Value Date   LDLCALC 154 (H) 09/30/2023   Lab Results  Component Value Date   TRIG 81 09/30/2023   Lab Results  Component Value Date   CHOLHDL 3.0 09/30/2023   Lab Results  Component Value Date   HGBA1C 6.2 (H) 09/30/2023      Assessment & Plan:  Degeneration of intervertebral disc of lumbar region, unspecified whether pain present Assessment & Plan: I reviewed the lumbar spine imaging study completed on June 08, 2011, which revealed mild degenerative disc and facet disease of the lumbar spine. The patient presents today with stiffness and lower back pain, which she rates as 4 out of 10. The pain is relieved with over-the-counter ibuprofen (Motrin). She also reports that vitamin D  complex supplementation provides symptom relief. The pain does not radiate down the lower extremities. She denies any recent trauma or injury, and no red flag symptoms (e.g., bowel/bladder dysfunction, numbness, weakness, unexplained weight loss) are reported.  Plan: Continue current over-the-counter Motrin as needed for pain A prescription for Flexeril (cyclobenzaprine) has been sent to the pharmacy for use at bedtime to relieve muscle  stiffness Recommend nonpharmacological interventions, including: Heat applications to the lower back Avoidance of prolonged sitting or standing Gentle stretching and strengthening exercises Adequate rest and ergonomic support   Orders: -     Urinalysis -     Cyclobenzaprine HCl; Take 1 tablet (5 mg total) by mouth daily as needed for muscle spasms.  Dispense: 30 tablet; Refill: 1  Primary hypertension Assessment & Plan: Controlled She takes amlodipine  5 mg daily She denies headaches, dizziness, blurred vision With a CBC and CMP today Encouraged to continue treatment regimen BP Readings from Last 3 Encounters:  04/20/24 125/77  01/14/24 120/64  01/06/24 (!) 155/88      Other specified hypothyroidism Assessment & Plan: Stable without medication Will assess thyroid  levels today   IFG (impaired fasting glucose) -     Hemoglobin A1c  Vitamin D  deficiency -     VITAMIN D  25 Hydroxy (Vit-D Deficiency, Fractures)  TSH (thyroid -stimulating hormone deficiency) -     TSH + free T4  Other hyperlipidemia -     Lipid panel -     CMP14+EGFR -     CBC with Differential/Platelet  Note: This chart has been completed using Engineer, civil (consulting) software, and while attempts have been made to ensure accuracy, certain words and phrases may not be transcribed as intended.    Follow-up: Return in about 4 months (around 08/21/2024).   Martine Trageser, FNP

## 2024-04-20 NOTE — Assessment & Plan Note (Signed)
Stable without medication Will assess thyroid levels today

## 2024-04-21 ENCOUNTER — Ambulatory Visit: Payer: Self-pay | Admitting: Family Medicine

## 2024-04-21 DIAGNOSIS — E782 Mixed hyperlipidemia: Secondary | ICD-10-CM

## 2024-04-21 LAB — LIPID PANEL
Chol/HDL Ratio: 2.9 ratio (ref 0.0–4.4)
Cholesterol, Total: 265 mg/dL — ABNORMAL HIGH (ref 100–199)
HDL: 90 mg/dL (ref >39)
LDL Chol Calc (NIH): 164 mg/dL — ABNORMAL HIGH (ref 0–99)
Triglycerides: 71 mg/dL (ref 0–149)
VLDL Cholesterol Cal: 11 mg/dL (ref 5–40)

## 2024-04-21 LAB — CBC WITH DIFFERENTIAL/PLATELET
Basophils Absolute: 0 10*3/uL (ref 0.0–0.2)
Basos: 0 %
EOS (ABSOLUTE): 0.2 10*3/uL (ref 0.0–0.4)
Eos: 5 %
Hematocrit: 38.8 % (ref 34.0–46.6)
Hemoglobin: 12.6 g/dL (ref 11.1–15.9)
Immature Grans (Abs): 0 10*3/uL (ref 0.0–0.1)
Immature Granulocytes: 0 %
Lymphocytes Absolute: 1.5 10*3/uL (ref 0.7–3.1)
Lymphs: 36 %
MCH: 29.3 pg (ref 26.6–33.0)
MCHC: 32.5 g/dL (ref 31.5–35.7)
MCV: 90 fL (ref 79–97)
Monocytes Absolute: 0.5 10*3/uL (ref 0.1–0.9)
Monocytes: 11 %
Neutrophils Absolute: 2 10*3/uL (ref 1.4–7.0)
Neutrophils: 48 %
Platelets: 246 10*3/uL (ref 150–450)
RBC: 4.3 x10E6/uL (ref 3.77–5.28)
RDW: 13 % (ref 11.7–15.4)
WBC: 4.2 10*3/uL (ref 3.4–10.8)

## 2024-04-21 LAB — CMP14+EGFR
ALT: 14 IU/L (ref 0–32)
AST: 22 IU/L (ref 0–40)
Albumin: 4.6 g/dL (ref 3.8–4.8)
Alkaline Phosphatase: 103 IU/L (ref 44–121)
BUN/Creatinine Ratio: 16 (ref 12–28)
BUN: 12 mg/dL (ref 8–27)
Bilirubin Total: 0.3 mg/dL (ref 0.0–1.2)
CO2: 25 mmol/L (ref 20–29)
Calcium: 11 mg/dL — ABNORMAL HIGH (ref 8.7–10.3)
Chloride: 103 mmol/L (ref 96–106)
Creatinine, Ser: 0.74 mg/dL (ref 0.57–1.00)
Globulin, Total: 2.6 g/dL (ref 1.5–4.5)
Glucose: 96 mg/dL (ref 70–99)
Potassium: 4.3 mmol/L (ref 3.5–5.2)
Sodium: 142 mmol/L (ref 134–144)
Total Protein: 7.2 g/dL (ref 6.0–8.5)
eGFR: 85 mL/min/{1.73_m2} (ref >59)

## 2024-04-21 LAB — TSH+FREE T4
Free T4: 1.06 ng/dL (ref 0.82–1.77)
TSH: 1.69 u[IU]/mL (ref 0.450–4.500)

## 2024-04-21 LAB — URINALYSIS
Bilirubin, UA: NEGATIVE
Glucose, UA: NEGATIVE
Ketones, UA: NEGATIVE
Leukocytes,UA: NEGATIVE
Nitrite, UA: NEGATIVE
Protein,UA: NEGATIVE
RBC, UA: NEGATIVE
Specific Gravity, UA: 1.025 (ref 1.005–1.030)
Urobilinogen, Ur: 0.2 mg/dL (ref 0.2–1.0)
pH, UA: 7 (ref 5.0–7.5)

## 2024-04-21 LAB — HEMOGLOBIN A1C
Est. average glucose Bld gHb Est-mCnc: 126 mg/dL
Hgb A1c MFr Bld: 6 % — ABNORMAL HIGH (ref 4.8–5.6)

## 2024-04-21 LAB — VITAMIN D 25 HYDROXY (VIT D DEFICIENCY, FRACTURES): Vit D, 25-Hydroxy: 46.7 ng/mL (ref 30.0–100.0)

## 2024-04-21 MED ORDER — ROSUVASTATIN CALCIUM 20 MG PO TABS: 20.0000 mg | ORAL_TABLET | Freq: Every day | ORAL | 3 refills | Status: AC

## 2024-04-26 ENCOUNTER — Ambulatory Visit

## 2024-05-22 ENCOUNTER — Ambulatory Visit
Admission: EM | Admit: 2024-05-22 | Discharge: 2024-05-22 | Disposition: A | Discharge status: Home / Self Care | Attending: Family Medicine | Admitting: Family Medicine

## 2024-05-22 DIAGNOSIS — M549 Dorsalgia, unspecified: Secondary | ICD-10-CM | POA: Diagnosis not present

## 2024-05-22 DIAGNOSIS — M069 Rheumatoid arthritis, unspecified: Secondary | ICD-10-CM | POA: Diagnosis not present

## 2024-05-22 MED ORDER — DEXAMETHASONE SODIUM PHOSPHATE 10 MG/ML IJ SOLN
10.0000 mg | Freq: Once | INTRAMUSCULAR | Status: AC
  Administered 2024-05-22: 10 mg via INTRAMUSCULAR

## 2024-05-22 MED ORDER — TIZANIDINE HCL 2 MG PO CAPS: 2.0000 mg | ORAL_CAPSULE | Freq: Three times a day (TID) | ORAL | 0 refills | Status: AC | PRN

## 2024-05-22 NOTE — ED Triage Notes (Signed)
 Pt reports lower back pain from the mid lower back to the right flank.

## 2024-05-23 ENCOUNTER — Ambulatory Visit: Payer: Self-pay

## 2024-05-23 NOTE — ED Provider Notes (Signed)
 [facility]    CSN: [id number] Arrival date & time: 05/22/24  1849      History   Chief Complaint No chief complaint on file.   HPI Megan [name] is a 73 y.o. female.   Patient presenting today with low back pain and right mid back/flank pain for the past few days.  Denies known injury to the area, radiation of pain down legs, numbness, tingling, weakness, fevers, bowel or bladder incontinence, saddle anesthesia.  History of rheumatoid arthritis and states this feels similar to a typical flare.  Trying over-the-counter remedies with minimal relief.    Past Medical History:  Diagnosis Date   Gout    High cholesterol    Hypertension    Kidney stone    Leukopenia 05/04/2016   Rheumatoid arthritis (HCC)    Seasonal allergies    Thyroid  disease     Patient Active Problem List   Diagnosis Date Noted   Strain of right trapezius muscle 01/04/2024   Pain behind the ear, left 02/26/2023   Great toe pain, right 12/22/2022   Laceration of right great toe 12/22/2022   Enuresis, nocturnal only 11/25/2022   Sinusitis, acute, maxillary 09/15/2022   Trochanteric bursitis, right hip 07/30/2022   Physical exam, annual 05/26/2022   Impaired vision in both eyes 05/26/2022   Elevated ferritin level 05/26/2022   PVC (premature ventricular contraction) 04/17/2022   Obesity (BMI 30-39.9) 01/23/2022   Irregular heart beat 08/21/2021   Acute gout of left ankle 07/15/2021   Osteoporosis 02/20/2021   GERD (gastroesophageal reflux disease) 07/14/2019   Acquired renal cyst of right kidney 07/14/2019   Rheumatoid arthritis (HCC) 05/09/2019   Arthritis of carpometacarpal (CMC) joint of right thumb 12/01/2018   Medial epicondylitis of elbow, right 12/01/2018   Hypertension 05/19/2017   DDD (degenerative disc disease), lumbar 05/19/2017   Hyperlipidemia 05/19/2017   Prediabetes 05/19/2017   Iron deficiency 05/19/2017   Hypothyroidism 05/19/2017   Leukopenia 05/04/2016    Arthritis of right knee 03/01/2012   Knee pain 02/25/2012    Past Surgical History:  Procedure Laterality Date   BILATERAL KNEE ARTHROSCOPY     COLONOSCOPY N/A 07/30/2014   Procedure: COLONOSCOPY;  Surgeon: Margo LITTIE Haddock, MD;  [facility];  Service: Endoscopy;  Laterality: N/A;  9:30 AM   high cholesterol     Right knee osteotomy      OB History     Gravida  3   Para      Term      Preterm      AB      Living  3      SAB      IAB      Ectopic      Multiple      Live Births               Home Medications    Prior to Admission medications   Medication Sig Start Date End Date Taking? Authorizing Provider  tizanidine  (ZANAFLEX ) 2 MG capsule Take 1 capsule (2 mg total) by mouth 3 (three) times daily as needed for muscle spasms. Do not drink alcohol  or drive while taking this medication.  May cause drowsiness. 05/22/24  Yes Stuart Vernell Norris, PA-C  amLODipine  (NORVASC ) 5 MG tablet TAKE 1 TABLET EVERY DAY 08/18/23   Zarwolo, Gloria, FNP  Calcium  Carb-Cholecalciferol  (CALCIUM  600 + D) 600-5 MG-MCG TABS Take 1 tablet by mouth daily.    [provider]  CINNAMON  PO Take by mouth. As needed    [provider]  cyanocobalamin  (VITAMIN B12) 1000 MCG tablet Take 1 tablet (1,000 mcg total) by mouth daily. 02/25/23   Zarwolo, Gloria, FNP  cyclobenzaprine  (FLEXERIL ) 5 MG tablet Take 1 tablet (5 mg total) by mouth daily as needed for muscle spasms. 04/20/24   Zarwolo, Gloria, FNP  ezetimibe  (ZETIA ) 10 MG tablet Take 1 tablet (10 mg total) by mouth daily. 12/02/22   Zarwolo, Gloria, FNP  GARLIC-LECITHIN PO Take by mouth.    [provider]  Ginger, Zingiber officinalis, (GINGER PO) Take by mouth. As needed    [provider]  Magnesium 200 MG TABS Take by mouth.    [provider]  Multiple Vitamins-Minerals (ONE-A-DAY WOMENS PO) Take by mouth. As needed    [provider]  Omega 3 1000 MG CAPS Take 1,000 mg by  mouth at bedtime.    [provider]  rosuvastatin  (CRESTOR ) 20 MG tablet Take 1 tablet (20 mg total) by mouth daily. 04/21/24   Zarwolo, Gloria, FNP    Family History Family History  Problem Relation Age of Onset   Other Father        Rare blood disease   Hypertension Mother    Asthma Mother    Hypertension Brother    Cancer Maternal Uncle    Colon cancer Paternal Uncle    Bladder Cancer Neg Hx    Kidney cancer Neg Hx     Social History Social History   Tobacco Use   Smoking status: Never   Smokeless tobacco: Never  Vaping Use   Vaping status: Never Used  Substance Use Topics   Alcohol  use: No   Drug use: No    Comment: used CBD oil     Allergies   Shrimp (diagnostic), Other, Hydrocodone , and Naproxen   Review of Systems Review of Systems PER HPI  Physical Exam Triage Vital Signs ED Triage Vitals [05/22/24 1942]  Encounter Vitals Group     BP (!) 142/90     Girls Systolic BP Percentile      Girls Diastolic BP Percentile      Boys Systolic BP Percentile      Boys Diastolic BP Percentile      Pulse Rate 71     Resp 18     Temp 98.9 F (37.2 C)     Temp Source Oral     SpO2 97 %     Weight      Height      Head Circumference      Peak Flow      Pain Score 9     Pain Loc      Pain Education      Exclude from Growth Chart    No data found.  Updated Vital Signs BP (!) 142/90 (BP Location: Right Arm)   Pulse 71   Temp 98.9 F (37.2 C) (Oral)   Resp 18   SpO2 97%   Visual Acuity Right Eye Distance:   Left Eye Distance:   Bilateral Distance:    Right Eye Near:   Left Eye Near:    Bilateral Near:     Physical Exam Vitals and nursing note reviewed.  Constitutional:      Appearance: Normal appearance. She is not ill-appearing.  HENT:     Head: Atraumatic.   Eyes:     Extraocular Movements: Extraocular movements intact.     Conjunctiva/sclera: Conjunctivae normal.    Cardiovascular:  Rate and Rhythm: Normal rate and  regular rhythm.     Heart sounds: Normal heart sounds.  Pulmonary:     Effort: Pulmonary effort is normal.     Breath sounds: Normal breath sounds.   Musculoskeletal:        General: Tenderness present. No swelling. Normal range of motion.     Cervical back: Normal range of motion and neck supple.   Skin:    General: Skin is warm and dry.   Neurological:     Mental Status: She is alert and oriented to person, place, and time.     Motor: No weakness.     Gait: Gait normal.     Comments: B/l LEs neurovascularly intact  Psychiatric:        Mood and Affect: Mood normal.        Thought Content: Thought content normal.        Judgment: Judgment normal.      UC Treatments / Results  Labs (all labs ordered are listed, but only abnormal results are displayed) Labs Reviewed - No data to display  EKG   Radiology No results found.  Procedures Procedures (including critical care time)  Medications Ordered in UC Medications  dexamethasone  (DECADRON ) injection 10 mg (10 mg Intramuscular Given 05/22/24 2009)    Initial Impression / Assessment and Plan / UC Course  I have reviewed the triage vital signs and the nursing notes.  Pertinent labs & imaging results that were available during my care of the patient were reviewed by me and considered in my medical decision making (see chart for details).     Suspect rheumatoid arthritis flare.  Will treat with IM Decadron , Zanaflex , heat, massage, stretches, over-the-counter pain relievers as needed.  No red flag findings on exam today.  Return for worsening symptoms. Final Clinical Impressions(s) / UC Diagnoses   Final diagnoses:  Mid back pain on right side  Rheumatoid arthritis, involving unspecified site, unspecified whether rheumatoid factor present [facility])   Discharge Instructions   None    ED Prescriptions     Medication Sig Dispense Auth. Provider   tizanidine  (ZANAFLEX ) 2 MG capsule Take 1 capsule (2 mg total) by mouth  3 (three) times daily as needed for muscle spasms. Do not drink alcohol  or drive while taking this medication.  May cause drowsiness. 15 capsule Stuart Vernell Norris, [place]      PDMP not reviewed this encounter.   Stuart Vernell Norris, PA-C 05/23/24 1455

## 2024-05-23 NOTE — Telephone Encounter (Signed)
 FYI Only or Action Required?: FYI only for provider.  Patient was last seen in primary care on 04/20/2024 by Zarwolo, Gloria, FNP. Called Nurse Triage reporting Back Pain. Symptoms began several days ago. Interventions attempted: OTC medications: Ibuprofen. Symptoms are: stable.  Triage Disposition: See PCP When Office is Open (Within 3 Days)  Patient/caregiver understands and will follow disposition?: Yes  Pt went to UC yesterday, received steroid shot and Tizanidine . Pt just got tizanidine  this morning. She states she hasn't noticed much improvement. Scheduled pt for appt on 05/25/24. Advised to continue medication and give the steroid shot more time. If feeling better can cancel appt.   Copied from CRM [id number]. Topic: Clinical - Red Word Triage >> May 23, 2024 12:07 PM Elle L wrote: Red Word that prompted transfer to Nurse Triage: The patient went to Urgent Care yesterday as she was having difficulty walking from what she believes is a rheumatoid arthritis attack. She was given a prescription for muscle spasms to last her throughout the week and a steroid shot. However, she is still experiencing severe pain. Reason for Disposition  [1] MODERATE back pain (e.g., interferes with normal activities) AND [2] present > 3 days  Answer Assessment - Initial Assessment Questions 1. ONSET: When did the pain begin?      4 days ago  2. LOCATION: Where does it hurt? (upper, mid or lower back)     Lower R back  3. SEVERITY: How bad is the pain?  (e.g., Scale 1-10; mild, moderate, or severe)   - MILD (1-3): Doesn't interfere with normal activities.    - MODERATE (4-7): Interferes with normal activities or awakens from sleep.    - SEVERE (8-10): Excruciating pain, unable to do any normal activities.      Moderate, husband has to help with elevating legs into bed  4. PATTERN: Is the pain constant? (e.g., yes, no; constant, intermittent)      Comes and goes  6. CAUSE:  What do you think is causing  the back pain?      RA flare up 8. MEDICINES: What have you taken so far for the pain? (e.g., nothing, acetaminophen , NSAIDS)     Ibuprofen  9. NEUROLOGIC SYMPTOMS: Do you have any weakness, numbness, or problems with bowel/bladder control?     no 10. OTHER SYMPTOMS: Do you have any other symptoms? (e.g., fever, abdomen pain, burning with urination, blood in urine)       Having to use walker to ambulate  Protocols used: Back Pain-A-AH

## 2024-05-25 ENCOUNTER — Ambulatory Visit (INDEPENDENT_AMBULATORY_CARE_PROVIDER_SITE_OTHER): Payer: Self-pay | Admitting: Internal Medicine

## 2024-05-25 ENCOUNTER — Encounter: Payer: Self-pay | Admitting: Internal Medicine

## 2024-05-25 VITALS — BP 128/82 | HR 69 | Ht 64.0 in | Wt 184.8 lb

## 2024-05-25 DIAGNOSIS — G8929 Other chronic pain: Secondary | ICD-10-CM

## 2024-05-25 DIAGNOSIS — M545 Low back pain, unspecified: Secondary | ICD-10-CM | POA: Diagnosis not present

## 2024-05-25 MED ORDER — PREDNISONE 20 MG PO TABS: 40.0000 mg | ORAL_TABLET | Freq: Every day | ORAL | 0 refills | Status: AC

## 2024-05-25 NOTE — Patient Instructions (Signed)
 Please start taking Prednisone  as prescribed.  Please take Tizanidine  as needed for muscle spasms.  Please avoid heavy lifting and frequent bending.

## 2024-05-25 NOTE — Assessment & Plan Note (Addendum)
 Acute on chronic low back pain, recent urgent care visit note reviewed Continue tizanidine  as needed for muscle spasms Oral prednisone  40 mg x 5 days Tylenol  arthritis as needed for pain Check x-ray of lumbar spine She reports history of rheumatoid arthritis, needs to see her rheumatologist to discuss DMARD - has seen Rosaline Salt at [facility]

## 2024-05-25 NOTE — Progress Notes (Signed)
 Acute Office Visit  Subjective:    Patient ID: Megan [name], female    DOB: 13-Mar-1951, 73 y.o.   MRN: [id number]  Chief Complaint  Patient presents with   Back Pain    Pt reports sx of right lower back pain. Ongoing for 1 week.     HPI Patient is in today for complaint of acute on chronic low back pain, for the last 1 week.  She was given Flexeril  in the last month, which was not effective.  She went to urgent care on 05/22/24, and was given dexamethasone  injection and oral tizanidine .  She has felt mild improvement with it.  Her back pain is constant, dull, radiating towards RLE and worse with bending.  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  Past Medical History:  Diagnosis Date   Gout    High cholesterol    Hypertension    Kidney stone    Leukopenia 05/04/2016   Rheumatoid arthritis (HCC)    Seasonal allergies    Thyroid  disease     Past Surgical History:  Procedure Laterality Date   BILATERAL KNEE ARTHROSCOPY     COLONOSCOPY N/A 07/30/2014   Procedure: COLONOSCOPY;  Surgeon: Margo LITTIE Haddock, MD;  Location: [facility];  Service: Endoscopy;  Laterality: N/A;  9:30 AM   high cholesterol     Right knee osteotomy      Family History  Problem Relation Age of Onset   Other Father        Rare blood disease   Hypertension Mother    Asthma Mother    Hypertension Brother    Cancer Maternal Uncle    Colon cancer Paternal Uncle    Bladder Cancer Neg Hx    Kidney cancer Neg Hx     Social History   Socioeconomic History   Marital status: Married    Spouse name: Not on file   Number of children: 4   Years of education: Not on file   Highest education level: Not on file  Occupational History   Occupation: Retired; Works PT in [place] [employer] at [employer] in [place]  Tobacco Use   Smoking status: Never   Smokeless tobacco: Never  Vaping Use   Vaping status: Never Used  Substance and Sexual Activity   Alcohol  use: No   Drug  use: No    Comment: used CBD oil   Sexual activity: Not Currently    Birth control/protection: Post-menopausal  Other Topics Concern   Not on file  Social History Narrative   3 biological children and 1 adopted   Social Drivers of Corporate investment banker Strain: Low Risk  (02/25/2023)   Overall Financial Resource Strain (CARDIA)    Difficulty of Paying Living Expenses: Not hard at all  Food Insecurity: No Food Insecurity (02/25/2023)   Hunger Vital Sign    Worried About Running Out of Food in the Last Year: Never true    Ran Out of Food in the Last Year: Never true  Transportation Needs: No Transportation Needs (02/25/2023)   PRAPARE - Administrator, Civil Service (Medical): No    Lack of Transportation (Non-Medical): No  Physical Activity: Sufficiently Active (02/25/2023)   Exercise Vital Sign    Days of Exercise per Week: 3 days    Minutes of Exercise per Session: 60 min  Stress: No Stress Concern Present (02/25/2023)   [employer] of Occupational Health - Occupational Stress  Questionnaire    Feeling of Stress : Not at all  Social Connections: Moderately Isolated (02/25/2023)   Social Connection and Isolation Panel    Frequency of Communication with Friends and Family: More than three times a week    Frequency of Social Gatherings with Friends and Family: Once a week    Attends Religious Services: Never    Database administrator or Organizations: No    Attends Banker Meetings: Never    Marital Status: Married  Catering manager Violence: Not At Risk (02/25/2023)   Humiliation, Afraid, Rape, and Kick questionnaire    Fear of Current or Ex-Partner: No    Emotionally Abused: No    Physically Abused: No    Sexually Abused: No    Outpatient Medications Prior to Visit  Medication Sig Dispense Refill   amLODipine  (NORVASC ) 5 MG tablet TAKE 1 TABLET EVERY DAY 90 tablet 3   Calcium  Carb-Cholecalciferol  (CALCIUM  600 + D) 600-5 MG-MCG TABS Take 1  tablet by mouth daily.     CINNAMON PO Take by mouth. As needed     cyanocobalamin  (VITAMIN B12) 1000 MCG tablet Take 1 tablet (1,000 mcg total) by mouth daily. 90 tablet 1   ezetimibe  (ZETIA ) 10 MG tablet Take 1 tablet (10 mg total) by mouth daily. 90 tablet 3   GARLIC-LECITHIN PO Take by mouth.     Ginger, Zingiber officinalis, (GINGER PO) Take by mouth. As needed     Magnesium 200 MG TABS Take by mouth.     Multiple Vitamins-Minerals (ONE-A-DAY WOMENS PO) Take by mouth. As needed     Omega 3 1000 MG CAPS Take 1,000 mg by mouth at bedtime.     rosuvastatin  (CRESTOR ) 20 MG tablet Take 1 tablet (20 mg total) by mouth daily. 90 tablet 3   tizanidine  (ZANAFLEX ) 2 MG capsule Take 1 capsule (2 mg total) by mouth 3 (three) times daily as needed for muscle spasms. Do not drink alcohol  or drive while taking this medication.  May cause drowsiness. 15 capsule 0   cyclobenzaprine  (FLEXERIL ) 5 MG tablet Take 1 tablet (5 mg total) by mouth daily as needed for muscle spasms. 30 tablet 1   No facility-administered medications prior to visit.    Allergies  Allergen Reactions   Shrimp (Diagnostic) Shortness Of Breath and Itching   Other Other (See Comments)    Nuts - pt states sometimes makes her gout flare up   Hydrocodone      Makes her feel bad   Naproxen     Makes her feel bad     Review of Systems  Constitutional:  Negative for chills and fever.  HENT:  Negative for congestion, sinus pressure, sinus pain and sore throat.   Eyes:  Negative for pain and discharge.  Respiratory:  Negative for cough and shortness of breath.   Cardiovascular:  Negative for chest pain and palpitations.  Gastrointestinal:  Negative for abdominal pain, diarrhea, nausea and vomiting.  Endocrine: Negative for polydipsia and polyuria.  Genitourinary:  Negative for dysuria and hematuria.  Musculoskeletal:  Positive for back pain. Negative for neck pain and neck stiffness.  Skin:  Negative for rash.  Neurological:   Negative for dizziness and weakness.  Psychiatric/Behavioral:  Negative for agitation and behavioral problems.        Objective:    Physical Exam Vitals reviewed.  Constitutional:      General: She is not in acute distress.    Appearance: She is not diaphoretic.  HENT:  Head: Normocephalic and atraumatic.     Nose: Nose normal.     Mouth/Throat:     Mouth: Mucous membranes are moist.   Eyes:     General: No scleral icterus.    Extraocular Movements: Extraocular movements intact.    Cardiovascular:     Rate and Rhythm: Normal rate and regular rhythm.     Heart sounds: Normal heart sounds. No murmur heard. Pulmonary:     Breath sounds: Normal breath sounds. No wheezing or rales.   Musculoskeletal:     Cervical back: Neck supple. No tenderness.     Lumbar back: Tenderness present. Normal range of motion. Negative right straight leg raise test and negative left straight leg raise test.     Right lower leg: No edema.     Left lower leg: No edema.   Skin:    General: Skin is warm.     Findings: No rash.   Neurological:     General: No focal deficit present.     Mental Status: She is alert and oriented to person, place, and time.   Psychiatric:        Mood and Affect: Mood normal.        Behavior: Behavior normal.     BP 128/82   Pulse 69   Ht 5' 4 (1.626 m)   Wt 184 lb 12.8 oz (83.8 kg)   SpO2 92%   BMI 31.72 kg/m  Wt Readings from Last 3 Encounters:  05/25/24 184 lb 12.8 oz (83.8 kg)  04/20/24 182 lb (82.6 kg)  01/14/24 182 lb 9.6 oz (82.8 kg)        Assessment & Plan:   Problem List Items Addressed This Visit       Other   Chronic low back pain without sciatica - Primary   Acute on chronic low back pain, recent urgent care visit note reviewed Continue tizanidine  as needed for muscle spasms Oral prednisone  40 mg x 5 days Tylenol  arthritis as needed for pain Check x-ray of lumbar spine She reports history of rheumatoid arthritis, needs to see  her rheumatologist to discuss DMARD - has seen Rosaline Salt at [facility]      Relevant Medications   predniSONE  (DELTASONE ) 20 MG tablet   Other Relevant Orders   DG Lumbar Spine Complete     Meds ordered this encounter  Medications   predniSONE  (DELTASONE ) 20 MG tablet    Sig: Take 2 tablets (40 mg total) by mouth daily with breakfast.    Dispense:  10 tablet    Refill:  0     Calea Hribar MARLA Blanch, MD

## 2024-06-01 ENCOUNTER — Ambulatory Visit (HOSPITAL_COMMUNITY)
Admission: RE | Admit: 2024-06-01 | Discharge: 2024-06-01 | Disposition: A | Discharge status: Home / Self Care | Source: Ambulatory Visit | Attending: Internal Medicine | Admitting: Internal Medicine

## 2024-06-01 DIAGNOSIS — G8929 Other chronic pain: Secondary | ICD-10-CM | POA: Diagnosis not present

## 2024-06-01 DIAGNOSIS — M545 Low back pain, unspecified: Secondary | ICD-10-CM | POA: Insufficient documentation

## 2024-06-01 DIAGNOSIS — M5136 Other intervertebral disc degeneration, lumbar region with discogenic back pain only: Secondary | ICD-10-CM | POA: Diagnosis not present

## 2024-06-01 DIAGNOSIS — M8580 Other specified disorders of bone density and structure, unspecified site: Secondary | ICD-10-CM | POA: Diagnosis not present

## 2024-06-01 DIAGNOSIS — M47816 Spondylosis without myelopathy or radiculopathy, lumbar region: Secondary | ICD-10-CM | POA: Diagnosis not present

## 2024-06-05 ENCOUNTER — Ambulatory Visit: Payer: Self-pay | Admitting: Internal Medicine

## 2024-06-05 NOTE — Progress Notes (Signed)
 Called to discuss imaging results. No answer. Left generic vm requesting call back.

## 2024-07-24 ENCOUNTER — Other Ambulatory Visit: Payer: Self-pay | Admitting: Family Medicine

## 2024-07-24 DIAGNOSIS — I10 Essential (primary) hypertension: Secondary | ICD-10-CM

## 2024-08-09 DIAGNOSIS — H2513 Age-related nuclear cataract, bilateral: Secondary | ICD-10-CM | POA: Diagnosis not present

## 2024-08-09 DIAGNOSIS — H524 Presbyopia: Secondary | ICD-10-CM | POA: Diagnosis not present

## 2024-08-09 DIAGNOSIS — H04123 Dry eye syndrome of bilateral lacrimal glands: Secondary | ICD-10-CM | POA: Diagnosis not present

## 2024-08-09 DIAGNOSIS — H43813 Vitreous degeneration, bilateral: Secondary | ICD-10-CM | POA: Diagnosis not present

## 2024-08-18 ENCOUNTER — Ambulatory Visit: Payer: Self-pay

## 2024-08-18 NOTE — Telephone Encounter (Signed)
 Patient scheduled 08/21/2024 at another office

## 2024-08-18 NOTE — Telephone Encounter (Signed)
 FYI Only or Action Required?: Action required by provider: request for appointment.  Patient was last seen in primary care on 05/25/2024 by Tobie Suzzane POUR, MD.  Called Nurse Triage reporting Diarrhea.  Symptoms began several weeks ago.  Interventions attempted: Rest, hydration, or home remedies and Dietary changes.  Symptoms are: unchanged.  Triage Disposition: See PCP When Office is Open (Within 3 Days)  Patient/caregiver understands and will follow disposition?: Yes, but will wait   Message from Charlet HERO sent at 08/18/2024 10:47 AM EDT  Paitent is calling bc she is having accessive diahreah had appt scheduled for 09/25 that was canceled by the provider would like to make appt with any provider.   Reason for Disposition  [1] MILD diarrhea (e.g., 1-3 or more stools than normal in past 24 hours) AND [2] present >  7 days  (Exception: Chronic diarrhea that is not worse.)  Answer Assessment - Initial Assessment Questions Additional info: 1) patient called to reschedule 4 month follow up that was cancelled by provider, next available with pcp for follow up is not until January. Please follow up with patient to reschedule 4 month follow up.  2) patient requesting acute appointment to evaluate and treat liquid diarrhea. She is currently using ginger and tumeric to help with inflammation and watery stool. She states this seems to help some. Acute visit scheduled at alternate regional practice location due to no availability at pcp location.    1. DIARRHEA SEVERITY: How bad is the diarrhea? How many more stools have you had in the past 24 hours than normal?      Increased frequency 2. ONSET: When did the diarrhea begin?      Several weeks, started after last visit.  3. STOOL DESCRIPTION:  How loose or watery is the diarrhea? What is the stool color? Is there any blood or mucous in the stool?     Water   4. VOMITING: Are you also vomiting? If Yes, ask: How many times in the  past 24 hours?      denies 5. ABDOMEN PAIN: Are you having any abdomen pain? If Yes, ask: What does it feel like? (e.g., crampy, dull, intermittent, constant)      denies 6. ABDOMEN PAIN SEVERITY: If present, ask: How bad is the pain?  (e.g., Scale 1-10; mild, moderate, or severe)     0/10 7. ORAL INTAKE: If vomiting, Have you been able to drink liquids? How much liquids have you had in the past 24 hours?     Not vomiting 8. HYDRATION: Any signs of dehydration? (e.g., dry mouth [not just dry lips], too weak to stand, dizziness, new weight loss) When did you last urinate?     denies 9. EXPOSURE: Have you traveled to a foreign country recently? Have you been exposed to anyone with diarrhea? Could you have eaten any food that was spoiled?      10. ANTIBIOTIC USE: Are you taking antibiotics now or have you taken antibiotics in the past 2 months?        11. OTHER SYMPTOMS: Do you have any other symptoms? (e.g., fever, blood in stool)   Denies  Protocols used: Diarrhea-A-AH

## 2024-08-21 ENCOUNTER — Ambulatory Visit: Admitting: Family Medicine

## 2024-08-21 ENCOUNTER — Encounter: Payer: Self-pay | Admitting: Family Medicine

## 2024-08-21 VITALS — BP 120/78 | HR 67 | Temp 98.3°F | Ht 64.0 in | Wt 184.1 lb

## 2024-08-21 DIAGNOSIS — R197 Diarrhea, unspecified: Secondary | ICD-10-CM | POA: Diagnosis not present

## 2024-08-21 NOTE — Progress Notes (Signed)
 Patient Office Visit  Assessment & Plan:  Diarrhea, unspecified type -     Comprehensive metabolic panel with GFR -     CBC with Differential/Platelet -     TSH -     Sedimentation rate   Assessment and Plan    Chronic watery diarrhea ? IBS vs other Chronic diarrhea for one year, worsening recently. No systemic symptoms. Weight gain noted. Inconsistent fiber intake may contribute. - Order blood work for diarrhea and weight gain. - Recommend OTC Imodium for symptom management. 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 Previous manual repositioning uncomfortable. - Recommend gynecologist consultation, preferably female, for further evaluation and management.  Obesity Weight increased from 159 lbs to 180 lbs. Normal thyroid  function. Previous hyperthyroidism, no current medication. Dietary habits include oatmeal and occasional baking. - Order blood work for weight gain investigation. - Discuss dietary modifications for weight management, including increased fiber intake.      Follow-up on lab work notify patient.  Sent a note to her primary care physician regarding colon cancer screening.  Hopefully this could be arranged through her primary care office.  Patient will start using over-the-counter Imodium to see if this will help. Return if symptoms worsen or fail to improve.   Subjective:    Patient ID: Megan [name], female    DOB: 01-06-1951  Age: 73 y.o. MRN: [id number]  Chief Complaint  Patient presents with   Diarrhea    Pt states she had loose bowels constantly x 1 year.    Diarrhea    Discussed the use of AI scribe software for clinical note transcription with the patient, who gave verbal consent to proceed.  History of Present Illness        Megan [name] is a 73 year old female who presents with chronic  diarrhea for one year.  She has experienced chronic diarrhea for the past year, with symptoms worsening over time. Bowel movements are watery, occurring three to four times daily. There is no visible blood in the stool, though she once suspected seeing a drop, which she attributed to consuming beets. No nausea, vomiting, fever, or chills. Her weight has increased from 159 pounds to 180 pounds over the past year, despite her perception of being overweight. She has not undergone a colonoscopy due to scheduling issues with her previous healthcare provider.  She has a history of overactive thyroid  in her twenties and thirties, for which she was on medication but is no longer taking it. Her thyroid  function was checked in May and was reported as normal. She has not used over-the-counter medications like Imodium for her diarrhea but uses a mixture of turmeric and ginger to manage her symptoms temporarily.  Her family history includes cancer on her father's side, with her uncle having stomach cancer and her grandmother dying of cancer. On her mother's side, there is a history of heart disease, and one of her uncles reportedly has 'loose bowel syndrome,' possibly irritable bowel syndrome.  She consumes oatmeal and occasionally other fiber-rich foods but not consistently. She has well water  at home and lives with her husband, who does not experience similar symptoms. She denies any recent travel or consumption of unusual foods.  She mentions a 'bubble' at the bottom of her private area, which affects her ability to have sexual intercourse. She has seen a female doctor for  this issue but prefers a female doctor for further management. She thinks she may have pelvic prolapse Physical Exam Results LABS Thyroid  function tests: Normal (03/2024) Assessment & Plan Chronic watery diarrhea ? IBS vs other Chronic diarrhea for one year, worsening recently. No systemic symptoms. Weight gain noted. Inconsistent fiber  intake may contribute. - Order blood work for diarrhea and weight gain. - Recommend OTC Imodium for symptom management. 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 Previous manual repositioning uncomfortable. - Recommend gynecologist consultation, preferably female, for further evaluation and management.  Obesity Weight increased from 159 lbs to 180 lbs. Normal thyroid  function. Previous hyperthyroidism, no current medication. Dietary habits include oatmeal and occasional baking. - Order blood work for weight gain investigation. - Discuss dietary modifications for weight management, including increased fiber intake.    The 10-year ASCVD risk score (Arnett DK, et al., 2019) is: 19.7%  Past Medical History:  Diagnosis Date   Gout    High cholesterol    Hypertension    Kidney stone    Leukopenia 05/04/2016   Rheumatoid arthritis (HCC)    Seasonal allergies    Thyroid  disease    Past Surgical History:  Procedure Laterality Date   BILATERAL KNEE ARTHROSCOPY     COLONOSCOPY N/A 07/30/2014   Procedure: COLONOSCOPY;  Surgeon: Margo LITTIE Haddock, MD;  Location: [facility];  Service: Endoscopy;  Laterality: N/A;  9:30 AM   high cholesterol     Right knee osteotomy     Social History   Tobacco Use   Smoking status: Never   Smokeless tobacco: Never  Vaping Use   Vaping status: Never Used  Substance Use Topics   Alcohol  use: No   Drug use: No    Comment: used CBD oil   Family History  Problem Relation Age of Onset   Other Father        Rare blood disease   Hypertension Mother    Asthma Mother    Hypertension Brother    Cancer Maternal Uncle    Colon cancer Paternal Uncle    Bladder Cancer Neg Hx    Kidney cancer Neg Hx    Allergies  Allergen Reactions   Shrimp (Diagnostic) Shortness Of Breath and Itching   Other Other  (See Comments)    Nuts - pt states sometimes makes her gout flare up   Hydrocodone      Makes her feel bad   Naproxen     Makes her feel bad     Review of Systems  Gastrointestinal:  Positive for diarrhea.      Objective:    BP 120/78   Pulse 67   Temp 98.3 F (36.8 C)   Ht 5' 4 (1.626 m)   Wt 184 lb 2 oz (83.5 kg)   SpO2 98%   BMI 31.60 kg/m  BP Readings from Last 3 Encounters:  08/21/24 120/78  05/25/24 128/82  05/22/24 (!) 142/90   Wt Readings from Last 3 Encounters:  08/21/24 184 lb 2 oz (83.5 kg)  05/25/24 184 lb 12.8 oz (83.8 kg)  04/20/24 182 lb (82.6 kg)    Physical Exam Vitals and nursing note reviewed.  Constitutional:      General: She is not in acute distress.    Appearance: Normal appearance.  HENT:     Head: Normocephalic.     Right Ear: Tympanic  membrane, ear canal and external ear normal.     Left Ear: Tympanic membrane, ear canal and external ear normal.  Eyes:     Extraocular Movements: Extraocular movements intact.     Pupils: Pupils are equal, round, and reactive to light.  Cardiovascular:     Rate and Rhythm: Normal rate and regular rhythm.     Heart sounds: Normal heart sounds.  Pulmonary:     Effort: Pulmonary effort is normal.     Breath sounds: Normal breath sounds. No wheezing.  Abdominal:     General: Bowel sounds are normal. There is no distension.     Tenderness: There is no abdominal tenderness. There is no guarding or rebound.  Musculoskeletal:     Right lower leg: No edema.     Left lower leg: No edema.  Neurological:     General: No focal deficit present.     Mental Status: She is alert and oriented to person, place, and time.  Psychiatric:        Mood and Affect: Mood normal.        Behavior: Behavior normal.      No results found for any visits on 08/21/24.

## 2024-08-22 ENCOUNTER — Ambulatory Visit: Payer: Self-pay | Admitting: Family Medicine

## 2024-08-22 LAB — CBC WITH DIFFERENTIAL/PLATELET
Absolute Lymphocytes: 1284 {cells}/uL (ref 850–3900)
Absolute Monocytes: 422 {cells}/uL (ref 200–950)
Basophils Absolute: 19 {cells}/uL (ref 0–200)
Basophils Relative: 0.5 %
Eosinophils Absolute: 259 {cells}/uL (ref 15–500)
Eosinophils Relative: 7 %
HCT: 38.4 % (ref 35.0–45.0)
Hemoglobin: 12.8 g/dL (ref 11.7–15.5)
MCH: 29.8 pg (ref 27.0–33.0)
MCHC: 33.3 g/dL (ref 32.0–36.0)
MCV: 89.3 fL (ref 80.0–100.0)
MPV: 11.7 fL (ref 7.5–12.5)
Monocytes Relative: 11.4 %
Neutro Abs: 1717 {cells}/uL (ref 1500–7800)
Neutrophils Relative %: 46.4 %
Platelets: 243 Thousand/uL (ref 140–400)
RBC: 4.3 Million/uL (ref 3.80–5.10)
RDW: 13.1 % (ref 11.0–15.0)
Total Lymphocyte: 34.7 %
WBC: 3.7 Thousand/uL — ABNORMAL LOW (ref 3.8–10.8)

## 2024-08-22 LAB — COMPREHENSIVE METABOLIC PANEL WITH GFR
AG Ratio: 1.5 (calc) (ref 1.0–2.5)
ALT: 12 U/L (ref 6–29)
AST: 17 U/L (ref 10–35)
Albumin: 4.3 g/dL (ref 3.6–5.1)
Alkaline phosphatase (APISO): 91 U/L (ref 37–153)
BUN: 10 mg/dL (ref 7–25)
CO2: 27 mmol/L (ref 20–32)
Calcium: 10 mg/dL (ref 8.6–10.4)
Chloride: 106 mmol/L (ref 98–110)
Creat: 0.65 mg/dL (ref 0.60–1.00)
Globulin: 2.8 g/dL (ref 1.9–3.7)
Glucose, Bld: 90 mg/dL (ref 65–99)
Potassium: 4.3 mmol/L (ref 3.5–5.3)
Sodium: 141 mmol/L (ref 135–146)
Total Bilirubin: 0.3 mg/dL (ref 0.2–1.2)
Total Protein: 7.1 g/dL (ref 6.1–8.1)
eGFR: 92 mL/min/1.73m2 (ref >=60)

## 2024-08-22 LAB — SEDIMENTATION RATE: Sed Rate: 41 mm/h — ABNORMAL HIGH (ref 0–30)

## 2024-08-22 LAB — TSH: TSH: 1.85 m[IU]/L (ref 0.40–4.50)

## 2024-08-23 NOTE — Progress Notes (Signed)
 Pt informed and verbalized understanding

## 2024-08-24 ENCOUNTER — Ambulatory Visit: Admitting: Family Medicine

## 2024-09-13 DIAGNOSIS — H2512 Age-related nuclear cataract, left eye: Secondary | ICD-10-CM | POA: Diagnosis not present

## 2024-09-13 DIAGNOSIS — H11002 Unspecified pterygium of left eye: Secondary | ICD-10-CM | POA: Diagnosis not present

## 2024-09-13 DIAGNOSIS — H2513 Age-related nuclear cataract, bilateral: Secondary | ICD-10-CM | POA: Diagnosis not present

## 2024-09-13 DIAGNOSIS — Z01818 Encounter for other preprocedural examination: Secondary | ICD-10-CM | POA: Diagnosis not present

## 2024-09-22 DIAGNOSIS — I10 Essential (primary) hypertension: Secondary | ICD-10-CM | POA: Diagnosis not present

## 2024-09-22 DIAGNOSIS — H25812 Combined forms of age-related cataract, left eye: Secondary | ICD-10-CM | POA: Diagnosis not present

## 2024-09-22 DIAGNOSIS — H2512 Age-related nuclear cataract, left eye: Secondary | ICD-10-CM | POA: Diagnosis not present

## 2024-09-22 DIAGNOSIS — H11002 Unspecified pterygium of left eye: Secondary | ICD-10-CM | POA: Diagnosis not present

## 2024-10-04 ENCOUNTER — Encounter (INDEPENDENT_AMBULATORY_CARE_PROVIDER_SITE_OTHER): Payer: Self-pay | Admitting: Gastroenterology

## 2024-10-20 DIAGNOSIS — H2511 Age-related nuclear cataract, right eye: Secondary | ICD-10-CM | POA: Diagnosis not present

## 2024-10-20 DIAGNOSIS — H25811 Combined forms of age-related cataract, right eye: Secondary | ICD-10-CM | POA: Diagnosis not present

## 2024-10-20 DIAGNOSIS — H25813 Combined forms of age-related cataract, bilateral: Secondary | ICD-10-CM | POA: Diagnosis not present

## 2024-10-24 ENCOUNTER — Other Ambulatory Visit (HOSPITAL_COMMUNITY): Payer: Self-pay | Admitting: Family Medicine

## 2024-10-24 DIAGNOSIS — N644 Mastodynia: Secondary | ICD-10-CM

## 2024-11-03 ENCOUNTER — Ambulatory Visit: Admitting: Family Medicine

## 2024-11-03 ENCOUNTER — Encounter: Payer: Self-pay | Admitting: Family Medicine

## 2024-11-03 VITALS — BP 129/86 | HR 73 | Ht 64.0 in | Wt 185.1 lb

## 2024-11-03 DIAGNOSIS — N814 Uterovaginal prolapse, unspecified: Secondary | ICD-10-CM | POA: Insufficient documentation

## 2024-11-03 NOTE — Progress Notes (Signed)
 Established Patient Office Visit  Subjective:  Patient ID: Megan [name], female    DOB: 12/08/1950  Age: 73 y.o. MRN: [id number]  CC:  Chief Complaint  Patient presents with   Medical Management of Chronic Issues    Pt has concerns that her Uterus has dropped.    HPI Megan [name] is a 73 y.o. female with past medical history of HTN, GERD, Hypothyroidism presents for f/u of  chronic medical conditions.  Patient has a known pessary that was placed in September/October by Dr. Jayne and was removed by Dr. Ozan on 12/17/2023 due to discomfort. She is currently under the care of OB/GYN and today reports feeling that her uterus is dropping. She describes a bulging sensation with pelvic pressure.    Past Medical History:  Diagnosis Date   Gout    High cholesterol    Hypertension    Kidney stone    Leukopenia 05/04/2016   Rheumatoid arthritis (HCC)    Seasonal allergies    Thyroid  disease     Past Surgical History:  Procedure Laterality Date   BILATERAL KNEE ARTHROSCOPY     COLONOSCOPY N/A 07/30/2014   Procedure: COLONOSCOPY;  Surgeon: Margo LITTIE Haddock, MD;  Location: [facility];  Service: Endoscopy;  Laterality: N/A;  9:30 AM   high cholesterol     Right knee osteotomy      Family History  Problem Relation Age of Onset   Other Father        Rare blood disease   Hypertension Mother    Asthma Mother    Hypertension Brother    Cancer Maternal Uncle    Colon cancer Paternal Uncle    Bladder Cancer Neg Hx    Kidney cancer Neg Hx     Social History   Socioeconomic History   Marital status: Married    Spouse name: Not on file   Number of children: 4   Years of education: Not on file   Highest education level: Not on file  Occupational History   Occupation: Retired; Works PT in [employer] at [employer] in [place]  Tobacco Use   Smoking status: Never   Smokeless tobacco: Never  Vaping Use   Vaping status: Never Used  Substance and Sexual Activity   Alcohol  use:  No   Drug use: No    Comment: used CBD oil   Sexual activity: Not Currently    Birth control/protection: Post-menopausal  Other Topics Concern   Not on file  Social History Narrative   3 biological children and 1 adopted   Social Drivers of Corporate Investment Banker Strain: Low Risk  (02/25/2023)   Overall Financial Resource Strain (CARDIA)    Difficulty of Paying Living Expenses: Not hard at all  Food Insecurity: No Food Insecurity (02/25/2023)   Hunger Vital Sign    Worried About Running Out of Food in the Last Year: Never true    Ran Out of Food in the Last Year: Never true  Transportation Needs: No Transportation Needs (02/25/2023)   PRAPARE - Administrator, Civil Service (Medical): No    Lack of Transportation (Non-Medical): No  Physical Activity: Sufficiently Active (02/25/2023)   Exercise Vital Sign    Days of Exercise per Week: 3 days    Minutes of Exercise per Session: 60 min  Stress: No Stress Concern Present (02/25/2023)   [employer] of Occupational Health - Occupational Stress Questionnaire    Feeling of Stress : Not  at all  Social Connections: Moderately Isolated (02/25/2023)   Social Connection and Isolation Panel    Frequency of Communication with Friends and Family: More than three times a week    Frequency of Social Gatherings with Friends and Family: Once a week    Attends Religious Services: Never    Database Administrator or Organizations: No    Attends Banker Meetings: Never    Marital Status: Married  Catering Manager Violence: Not At Risk (02/25/2023)   Humiliation, Afraid, Rape, and Kick questionnaire    Fear of Current or Ex-Partner: No    Emotionally Abused: No    Physically Abused: No    Sexually Abused: No    Outpatient Medications Prior to Visit  Medication Sig Dispense Refill   amLODipine  (NORVASC ) 5 MG tablet TAKE 1 TABLET EVERY DAY 90 tablet 3   Calcium  Carb-Cholecalciferol  (CALCIUM  600 + D) 600-5 MG-MCG TABS  Take 1 tablet by mouth daily.     CINNAMON PO Take by mouth. As needed     cyanocobalamin  (VITAMIN B12) 1000 MCG tablet Take 1 tablet (1,000 mcg total) by mouth daily. 90 tablet 1   ezetimibe  (ZETIA ) 10 MG tablet Take 1 tablet (10 mg total) by mouth daily. 90 tablet 3   GARLIC-LECITHIN PO Take by mouth.     Ginger, Zingiber officinalis, (GINGER PO) Take by mouth. As needed     Magnesium 200 MG TABS Take by mouth.     Multiple Vitamins-Minerals (ONE-A-DAY WOMENS PO) Take by mouth. As needed     Omega 3 1000 MG CAPS Take 1,000 mg by mouth at bedtime.     predniSONE  (DELTASONE ) 20 MG tablet Take 2 tablets (40 mg total) by mouth daily with breakfast. 10 tablet 0   rosuvastatin  (CRESTOR ) 20 MG tablet Take 1 tablet (20 mg total) by mouth daily. 90 tablet 3   tizanidine  (ZANAFLEX ) 2 MG capsule Take 1 capsule (2 mg total) by mouth 3 (three) times daily as needed for muscle spasms. Do not drink alcohol  or drive while taking this medication.  May cause drowsiness. 15 capsule 0   No facility-administered medications prior to visit.    Allergies  Allergen Reactions   Shrimp (Diagnostic) Shortness Of Breath and Itching   Other Other (See Comments)    Nuts - pt states sometimes makes her gout flare up   Hydrocodone      Makes her feel bad   Naproxen     Makes her feel bad     ROS Review of Systems  Constitutional:  Negative for chills and fever.  Eyes:  Negative for visual disturbance.  Respiratory:  Negative for chest tightness and shortness of breath.   Neurological:  Negative for dizziness and headaches.      Objective:    Physical Exam HENT:     Head: Normocephalic.     Mouth/Throat:     Mouth: Mucous membranes are moist.  Cardiovascular:     Rate and Rhythm: Normal rate.     Heart sounds: Normal heart sounds.  Pulmonary:     Effort: Pulmonary effort is normal.     Breath sounds: Normal breath sounds.  Genitourinary:    Uterus: With uterine prolapse (uterine).   Neurological:      Mental Status: She is alert.     BP 129/86   Pulse 73   Ht 5' 4 (1.626 m)   Wt 185 lb 1.9 oz (84 kg)   SpO2 94%   BMI 31.78  kg/m  Wt Readings from Last 3 Encounters:  11/03/24 185 lb 1.9 oz (84 kg)  08/21/24 184 lb 2 oz (83.5 kg)  05/25/24 184 lb 12.8 oz (83.8 kg)    Lab Results  Component Value Date   TSH 1.85 08/21/2024   Lab Results  Component Value Date   WBC 3.7 (L) 08/21/2024   HGB 12.8 08/21/2024   HCT 38.4 08/21/2024   MCV 89.3 08/21/2024   PLT 243 08/21/2024   Lab Results  Component Value Date   NA 141 08/21/2024   K 4.3 08/21/2024   CO2 27 08/21/2024   GLUCOSE 90 08/21/2024   BUN 10 08/21/2024   CREATININE 0.65 08/21/2024   BILITOT 0.3 08/21/2024   ALKPHOS 103 04/20/2024   AST 17 08/21/2024   ALT 12 08/21/2024   PROT 7.1 08/21/2024   ALBUMIN 4.6 04/20/2024   CALCIUM  10.0 08/21/2024   ANIONGAP 8 07/16/2017   EGFR 92 08/21/2024   Lab Results  Component Value Date   CHOL 265 (H) 04/20/2024   Lab Results  Component Value Date   HDL 90 04/20/2024   Lab Results  Component Value Date   LDLCALC 164 (H) 04/20/2024   Lab Results  Component Value Date   TRIG 71 04/20/2024   Lab Results  Component Value Date   CHOLHDL 2.9 04/20/2024   Lab Results  Component Value Date   HGBA1C 6.0 (H) 04/20/2024      Assessment & Plan:  Uterine prolapse Assessment & Plan: -The patient would like to follow up with a GYN provider (different from her current one); referral placed. -Recommend conservative management. -Pelvic floor (Kegel) exercises encouraged to strengthen pelvic muscles; education on proper technique provided. -Advise avoiding heavy lifting or straining, as these can worsen symptoms. -Encourage weight management to help reduce pelvic pressure. -Referral placed to pelvic floor physical therapy for guided strengthening and symptom relief.    Orders: -     Ambulatory referral to Physical Therapy -     Ambulatory referral to  Obstetrics / Gynecology   Note: This chart has been completed using Dragon Medical Dictation software, and while attempts have been made to ensure accuracy, certain words and phrases may not be transcribed as intended.   Follow-up: No follow-ups on file.   Todd Jelinski  Z Bacchus, FNP

## 2024-11-03 NOTE — Patient Instructions (Addendum)
 I appreciate the opportunity to provide care to you today!  Uterine prolapse -Please follow up with GYN -I recommend conservative Management -Pelvic floor (Kegel) exercises recommended to strengthen pelvic muscles; provided education on proper technique. -Avoid heavy lifting and straining, which may worsen symptoms. -Encourage weight management to reduce pelvic pressure. Consider pelvic floor physical therapy referral for guided strengthening and symptom management.  Please follow up if your symptoms worsen or fail to improve.   Referrals today- Physical Therapy  for pelvic floor exercises / Dr. Rutherford  Attached with your AVS, you will find valuable resources for self-education. I highly recommend dedicating some time to thoroughly examine them.   Please continue to a heart-healthy diet and increase your physical activities. Try to exercise for at least five days a week.    It was a pleasure to see you and I look forward to continuing to work together on your health and well-being. Please do not hesitate to call the office if you need care or have questions about your care.  In case of emergency, please visit the Emergency Department for urgent care, or contact our clinic at [phone] to schedule an appointment. We're here to help you!   Have a wonderful day and week. With Gratitude, Meade JENEANE Gerlach MSN, FNP-BC, PMHNP-BC

## 2024-11-03 NOTE — Assessment & Plan Note (Signed)
-  The patient would like to follow up with a GYN provider (different from her current one); referral placed. -Recommend conservative management. -Pelvic floor (Kegel) exercises encouraged to strengthen pelvic muscles; education on proper technique provided. -Advise avoiding heavy lifting or straining, as these can worsen symptoms. -Encourage weight management to help reduce pelvic pressure. -Referral placed to pelvic floor physical therapy for guided strengthening and symptom relief.

## 2024-11-16 ENCOUNTER — Inpatient Hospital Stay (HOSPITAL_COMMUNITY): Admission: RE | Admit: 2024-11-16 | Discharge: 2024-11-16 | Attending: Family Medicine

## 2024-11-16 ENCOUNTER — Ambulatory Visit (HOSPITAL_COMMUNITY)

## 2024-11-16 ENCOUNTER — Ambulatory Visit (HOSPITAL_COMMUNITY): Admission: RE | Admit: 2024-11-16 | Discharge: 2024-11-16 | Attending: Family Medicine

## 2024-11-16 DIAGNOSIS — N644 Mastodynia: Secondary | ICD-10-CM

## 2025-01-03 ENCOUNTER — Ambulatory Visit: Admitting: Physical Therapy

## 2025-01-03 NOTE — Progress Notes (Unsigned)
 " OUTPATIENT PHYSICAL THERAPY FEMALE PELVIC EVALUATION   Patient Name: Megan [name] MRN: [id number] DOB:[birth year], 74 y.o., female Today's Date: 01/03/2025  END OF SESSION:   Past Medical History:  Diagnosis Date   Gout    High cholesterol    Hypertension    Kidney stone    Leukopenia 05/04/2016   Rheumatoid arthritis (HCC)    Seasonal allergies    Thyroid  disease    Past Surgical History:  Procedure Laterality Date   BILATERAL KNEE ARTHROSCOPY     COLONOSCOPY N/A 07/30/2014   Procedure: COLONOSCOPY;  Surgeon: Margo LITTIE Haddock, MD;  Location: [facility];  Service: Endoscopy;  Laterality: N/A;  9:30 AM   high cholesterol     Right knee osteotomy     Patient Active Problem List   Diagnosis Date Noted   Uterine prolapse 11/03/2024   Chronic low back pain without sciatica 05/25/2024   Strain of right trapezius muscle 01/04/2024   Pain behind the ear, left 02/26/2023   Great toe pain, right 12/22/2022   Laceration of right great toe 12/22/2022   Enuresis, nocturnal only 11/25/2022   Sinusitis, acute, maxillary 09/15/2022   Trochanteric bursitis, right hip 07/30/2022   Physical exam, annual 05/26/2022   Impaired vision in both eyes 05/26/2022   Elevated ferritin level 05/26/2022   PVC (premature ventricular contraction) 04/17/2022   Obesity (BMI 30-39.9) 01/23/2022   Irregular heart beat 08/21/2021   Acute gout of left ankle 07/15/2021   Osteoporosis 02/20/2021   GERD (gastroesophageal reflux disease) 07/14/2019   Acquired renal cyst of right kidney 07/14/2019   Rheumatoid arthritis (HCC) 05/09/2019   Arthritis of carpometacarpal [facility]) joint of right thumb 12/01/2018   Medial epicondylitis of elbow, right 12/01/2018   Hypertension 05/19/2017   DDD (degenerative disc disease), lumbar 05/19/2017   Hyperlipidemia 05/19/2017   Prediabetes 05/19/2017   Iron deficiency 05/19/2017   Hypothyroidism 05/19/2017   Leukopenia 05/04/2016   Arthritis of right knee 03/01/2012    Knee pain 02/25/2012    PCP: Edman Meade PEDLAR, FNP   REFERRING PROVIDER: Edman Meade PEDLAR, FNP   REFERRING DIAG: N81.4 (ICD-10-CM) - Uterine prolapse  THERAPY DIAG:  No diagnosis found.  Rationale for Evaluation and Treatment: Rehabilitation  ONSET DATE: ***  SUBJECTIVE:                                                                                                                                                                                           SUBJECTIVE STATEMENT: Eval:   Patient has a known pessary that was placed in September/October by Dr. Jayne and was removed by Dr. Ozan on  12/17/2023 due to discomfort. She is currently under the care of OB/GYN and today reports feeling that her uterus is dropping. She describes a bulging sensation with pelvic pressure. Fluid intake:   FUNCTIONAL LIMITATIONS: ***  PERTINENT HISTORY:  Medications for current condition: *** Surgeries: bilateral knee arthroscopy, colonoscopy, right knee osteotomy  Other: Patient has a known pessary that was placed in September/October by Dr. Jayne and was removed by Dr. Ozan on 12/17/2023 due to discomfort.  Sexual abuse: {Yes/No:304960894}  PAIN:  Are you having pain? {yes/no:20286} NPRS scale: ***/10 Pain location: {pelvic pain location:27098}  Pain type: {type:313116} Pain description: {PAIN DESCRIPTION:21022940}   Aggravating factors: *** Relieving factors: ***  PRECAUTIONS: None  RED FLAGS: None   WEIGHT BEARING RESTRICTIONS: No  FALLS:  Has patient fallen in last 6 months? No  OCCUPATION: ***  ACTIVITY LEVEL : ***  PLOF: Independent  PATIENT GOALS: ***  BOWEL MOVEMENT: Pain with bowel movement: {yes/no:20286} Type of bowel movement:{PT BM type:27100} Fully empty rectum: {No/Yes:304960894} Leakage: {Yes/No:304960894}                                                  Caused by: *** Bowel urgency: *** Pads: {Yes/No:304960894} Fiber supplement/laxative {YES/NO  AS:20300}  URINATION: Pain with urination: {yes/no:20286} Fully empty bladder: {Yes/No:304960894}***                                         Post-void dribble: {YES/NO AS:20300} Stream: {PT urination:27102} Urgency: {YES/NO AS:20300} Frequency:during the day ***                                                        Nocturia: {Yes/No:304960894}***   Leakage: {PT leakage:27103} Pads/briefs: {Yes/No:304960894}  INTERCOURSE:  Ability to have vaginal penetration {YES/NO:21197} Pain with intercourse: {pain with intercourse PA:27099} Dryness: {YES/NO AS:20300} Climax: *** Marinoff Scale: ***/3 Lubricant:  PREGNANCY: Vaginal deliveries *** Tearing {Yes***/No:304960894} Episiotomy {YES/NO AS:20300} C-section deliveries *** Currently pregnant {Yes***/No:304960894}  PROLAPSE: Pressure  OBJECTIVE:  Note: Objective measures were completed at Evaluation unless otherwise noted.  PATIENT SURVEYS:  PFIQ-7: *** UIQ-7 *** CRAIG -7 *** POPIQ-7 *** Female Sexual Function Index (FSFI) Questionnaire ***  COGNITION: Overall cognitive status: Within functional limits for tasks assessed     SENSATION: Light touch: Appears intact  LUMBAR SPECIAL TESTS:  Straight leg raise test: Positive  FUNCTIONAL TESTS:  single leg stance:  Rt: +   Lt: +  Sit-up test: 1/3 Squat: within functional limits  Bed mobility: within functional limits   GAIT: Assistive device utilized: {Assistive devices:23999} Comments: mild trendelenburg gait pattern with ambulation   POSTURE: rounded shoulders and forward head  LUMBARAROM/PROM:  A/PROM A/PROM  Eval (% available)  Flexion   Extension   Right lateral flexion   Left lateral flexion   Right rotation   Left rotation    (Blank rows = not tested)  LOWER EXTREMITY ROM:  LOWER EXTREMITY MMT: 3/5 bilateral knees and hips grossly   PALPATION:  General: ***  Pelvic Alignment: ***  Abdominal: ***  Diastasis:  {Yes/No:304960894}*** Distortion: {YES/NO AS:20300}  Breathing: *** Scar  tissue: {Yes/No:304960894}*** Active Straight Leg Raise: ***                External Perineal Exam: ***                             Internal Pelvic Floor: ***  Patient confirms identification and approves PT to assess internal pelvic floor and treatment {yes/no:20286} All internal or external pelvic floor assessments and/or treatments are completed with proper hand hygiene and gloves hands. If needed gloves are changed with hand hygiene during patient care time.  PELVIC MMT:   MMT eval  Vaginal   Internal Anal Sphincter   External Anal Sphincter   Puborectalis   (Blank rows = not tested)       TONE: ***  PROLAPSE: ***  TODAY'S TREATMENT:                                                                                                                              DATE:   EVAL 01/03/25: Examination completed, findings reviewed, pt educated on POC, HEP, and self care. Pt motivated to participate in PT and agreeable to attempt recommendations.     PATIENT EDUCATION:  Education details: relative anatomy and the connection between the diaphragm and pelvic floor  Person educated: Patient Education method: Explanation, Demonstration, Tactile cues, Verbal cues, and Handouts Education comprehension: verbalized understanding, returned demonstration, verbal cues required, tactile cues required, and needs further education  HOME EXERCISE PROGRAM: ***  ASSESSMENT:  CLINICAL IMPRESSION: Patient is a 74 y.o. female  who was seen today for physical therapy evaluation and treatment for uterine prolapse management.   Overall, pt tolerated session well and Pt would benefit from additional PT to further address deficits.     OBJECTIVE IMPAIRMENTS: decreased coordination, decreased endurance, decreased mobility, decreased ROM, decreased strength, impaired flexibility, and impaired tone.   ACTIVITY LIMITATIONS: carrying,  lifting, bending, standing, squatting, stairs, transfers, continence, and toileting  PARTICIPATION LIMITATIONS: {participationrestrictions:25113}  PERSONAL FACTORS: Age, Past/current experiences, and Time since onset of injury/illness/exacerbation are also affecting patient's functional outcome.   REHAB POTENTIAL: Good  CLINICAL DECISION MAKING: Stable/uncomplicated  EVALUATION COMPLEXITY: Low   GOALS: Goals reviewed with patient? Yes  SHORT TERM GOALS: Target date: 01/31/2025  Pt will be independent with HEP.  Baseline: Goal status: INITIAL  2.  *** Baseline:  Goal status: INITIAL  3.  *** Baseline:  Goal status: INITIAL  4.  *** Baseline:  Goal status: INITIAL  5.  *** Baseline:  Goal status: INITIAL  6.  *** Baseline:  Goal status: INITIAL  LONG TERM GOALS: Target date: 07/03/2025  Pt will be independent with advanced HEP.  Baseline:  Goal status: INITIAL  2.  *** Baseline:  Goal status: INITIAL  3.  *** Baseline:  Goal status: INITIAL  4.  *** Baseline:  Goal status: INITIAL  5.  *** Baseline:  Goal status: INITIAL  6.  *** Baseline:  Goal status: INITIAL  PLAN:  PT FREQUENCY: 1-2x/week  PT DURATION: 6 months  PLANNED INTERVENTIONS: 97110-Therapeutic exercises, 97530- Therapeutic activity, 97112- Neuromuscular re-education, 97535- Self Care, 02859- Manual therapy, Patient/Family education, Stair training, Taping, Joint mobilization, Spinal mobilization, Scar mobilization, Cryotherapy, Moist heat, and Biofeedback  PLAN FOR NEXT SESSION: ***  Celena JAYSON Domino, PT 01/03/2025, 10:37 AM [facility] [address] [place], [place] [address] Phone # [phone] Fax [phone]   "

## 2025-01-10 ENCOUNTER — Encounter: Payer: Self-pay | Admitting: Family Medicine
